# Patient Record
Sex: Female | Born: 1980 | Race: Black or African American | Hispanic: No | Marital: Single | Smoking: Current some day smoker
Health system: Southern US, Community
[De-identification: ages and names within clinical notes are randomized; demographics above are authoritative.]

## PROBLEM LIST (undated history)

## (undated) ENCOUNTER — Inpatient Hospital Stay (HOSPITAL_COMMUNITY): Payer: Self-pay

## (undated) DIAGNOSIS — K219 Gastro-esophageal reflux disease without esophagitis: Secondary | ICD-10-CM

## (undated) DIAGNOSIS — R42 Dizziness and giddiness: Secondary | ICD-10-CM

## (undated) DIAGNOSIS — F329 Major depressive disorder, single episode, unspecified: Secondary | ICD-10-CM

## (undated) DIAGNOSIS — F32A Depression, unspecified: Secondary | ICD-10-CM

## (undated) DIAGNOSIS — N83209 Unspecified ovarian cyst, unspecified side: Secondary | ICD-10-CM

## (undated) DIAGNOSIS — E669 Obesity, unspecified: Secondary | ICD-10-CM

---
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
## 2000-07-29 ENCOUNTER — Emergency Department (HOSPITAL_COMMUNITY): Admission: EM | Admit: 2000-07-29 | Discharge: 2000-07-30 | Payer: Self-pay | Admitting: Emergency Medicine

## 2000-07-29 ENCOUNTER — Encounter: Payer: Self-pay | Admitting: Emergency Medicine

## 2002-07-14 ENCOUNTER — Emergency Department (HOSPITAL_COMMUNITY): Admission: EM | Admit: 2002-07-14 | Discharge: 2002-07-14 | Payer: Self-pay | Admitting: Emergency Medicine

## 2002-08-11 ENCOUNTER — Emergency Department (HOSPITAL_COMMUNITY): Admission: EM | Admit: 2002-08-11 | Discharge: 2002-08-11 | Payer: Self-pay | Admitting: Emergency Medicine

## 2003-06-28 ENCOUNTER — Emergency Department (HOSPITAL_COMMUNITY): Admission: EM | Admit: 2003-06-28 | Discharge: 2003-06-28 | Payer: Self-pay

## 2003-10-04 ENCOUNTER — Emergency Department (HOSPITAL_COMMUNITY): Admission: EM | Admit: 2003-10-04 | Discharge: 2003-10-05 | Payer: Self-pay | Admitting: Emergency Medicine

## 2004-02-21 ENCOUNTER — Emergency Department (HOSPITAL_COMMUNITY): Admission: EM | Admit: 2004-02-21 | Discharge: 2004-02-21 | Payer: Self-pay | Admitting: Emergency Medicine

## 2004-05-23 ENCOUNTER — Emergency Department (HOSPITAL_COMMUNITY): Admission: EM | Admit: 2004-05-23 | Discharge: 2004-05-23 | Payer: Self-pay | Admitting: *Deleted

## 2004-12-11 ENCOUNTER — Emergency Department (HOSPITAL_COMMUNITY): Admission: EM | Admit: 2004-12-11 | Discharge: 2004-12-11 | Payer: Self-pay | Admitting: Family Medicine

## 2005-03-06 ENCOUNTER — Emergency Department (HOSPITAL_COMMUNITY): Admission: EM | Admit: 2005-03-06 | Discharge: 2005-03-06 | Payer: Self-pay | Admitting: *Deleted

## 2005-06-08 ENCOUNTER — Emergency Department (HOSPITAL_COMMUNITY): Admission: EM | Admit: 2005-06-08 | Discharge: 2005-06-08 | Payer: Self-pay | Admitting: Emergency Medicine

## 2006-06-22 ENCOUNTER — Emergency Department (HOSPITAL_COMMUNITY): Admission: EM | Admit: 2006-06-22 | Discharge: 2006-06-22 | Payer: Self-pay | Admitting: Family Medicine

## 2006-06-25 ENCOUNTER — Emergency Department (HOSPITAL_COMMUNITY): Admission: EM | Admit: 2006-06-25 | Discharge: 2006-06-25 | Payer: Self-pay | Admitting: Emergency Medicine

## 2006-07-07 ENCOUNTER — Emergency Department (HOSPITAL_COMMUNITY): Admission: EM | Admit: 2006-07-07 | Discharge: 2006-07-07 | Payer: Self-pay | Admitting: Emergency Medicine

## 2006-08-02 ENCOUNTER — Emergency Department (HOSPITAL_COMMUNITY): Admission: EM | Admit: 2006-08-02 | Discharge: 2006-08-02 | Payer: Self-pay | Admitting: *Deleted

## 2006-08-05 ENCOUNTER — Ambulatory Visit: Payer: Self-pay | Admitting: Family Medicine

## 2006-09-17 ENCOUNTER — Emergency Department (HOSPITAL_COMMUNITY): Admission: EM | Admit: 2006-09-17 | Discharge: 2006-09-17 | Payer: Self-pay | Admitting: Emergency Medicine

## 2006-09-24 ENCOUNTER — Emergency Department (HOSPITAL_COMMUNITY): Admission: EM | Admit: 2006-09-24 | Discharge: 2006-09-24 | Payer: Self-pay | Admitting: Family Medicine

## 2007-06-04 ENCOUNTER — Emergency Department (HOSPITAL_COMMUNITY): Admission: EM | Admit: 2007-06-04 | Discharge: 2007-06-04 | Payer: Self-pay | Admitting: Emergency Medicine

## 2007-07-09 ENCOUNTER — Inpatient Hospital Stay (HOSPITAL_COMMUNITY): Admission: AD | Admit: 2007-07-09 | Discharge: 2007-07-09 | Payer: Self-pay | Admitting: Gynecology

## 2007-08-18 ENCOUNTER — Ambulatory Visit (HOSPITAL_COMMUNITY): Admission: RE | Admit: 2007-08-18 | Discharge: 2007-08-18 | Payer: Self-pay | Admitting: Family Medicine

## 2007-08-26 ENCOUNTER — Ambulatory Visit (HOSPITAL_COMMUNITY): Admission: RE | Admit: 2007-08-26 | Discharge: 2007-08-26 | Payer: Self-pay | Admitting: Family Medicine

## 2007-09-21 ENCOUNTER — Ambulatory Visit (HOSPITAL_COMMUNITY): Admission: RE | Admit: 2007-09-21 | Discharge: 2007-09-21 | Payer: Self-pay | Admitting: Obstetrics & Gynecology

## 2007-09-28 ENCOUNTER — Inpatient Hospital Stay (HOSPITAL_COMMUNITY): Admission: AD | Admit: 2007-09-28 | Discharge: 2007-09-28 | Payer: Self-pay | Admitting: Obstetrics & Gynecology

## 2007-10-08 ENCOUNTER — Ambulatory Visit (HOSPITAL_COMMUNITY): Admission: RE | Admit: 2007-10-08 | Discharge: 2007-10-08 | Payer: Self-pay | Admitting: Obstetrics & Gynecology

## 2007-10-09 ENCOUNTER — Emergency Department (HOSPITAL_COMMUNITY): Admission: EM | Admit: 2007-10-09 | Discharge: 2007-10-10 | Payer: Self-pay | Admitting: *Deleted

## 2007-10-17 ENCOUNTER — Inpatient Hospital Stay (HOSPITAL_COMMUNITY): Admission: AD | Admit: 2007-10-17 | Discharge: 2007-10-17 | Payer: Self-pay | Admitting: Obstetrics and Gynecology

## 2007-11-06 ENCOUNTER — Ambulatory Visit (HOSPITAL_COMMUNITY): Admission: RE | Admit: 2007-11-06 | Discharge: 2007-11-06 | Payer: Self-pay | Admitting: Family Medicine

## 2007-12-01 ENCOUNTER — Ambulatory Visit: Payer: Self-pay | Admitting: *Deleted

## 2007-12-01 ENCOUNTER — Inpatient Hospital Stay (HOSPITAL_COMMUNITY): Admission: AD | Admit: 2007-12-01 | Discharge: 2007-12-01 | Payer: Self-pay | Admitting: Gynecology

## 2007-12-06 ENCOUNTER — Inpatient Hospital Stay (HOSPITAL_COMMUNITY): Admission: AD | Admit: 2007-12-06 | Discharge: 2007-12-06 | Payer: Self-pay | Admitting: Family Medicine

## 2007-12-06 ENCOUNTER — Ambulatory Visit: Payer: Self-pay | Admitting: Obstetrics and Gynecology

## 2008-01-03 ENCOUNTER — Ambulatory Visit: Payer: Self-pay | Admitting: Obstetrics and Gynecology

## 2008-01-03 ENCOUNTER — Inpatient Hospital Stay (HOSPITAL_COMMUNITY): Admission: AD | Admit: 2008-01-03 | Discharge: 2008-01-03 | Payer: Self-pay | Admitting: Obstetrics and Gynecology

## 2008-01-14 ENCOUNTER — Inpatient Hospital Stay (HOSPITAL_COMMUNITY): Admission: AD | Admit: 2008-01-14 | Discharge: 2008-01-14 | Payer: Self-pay | Admitting: Obstetrics & Gynecology

## 2008-01-14 ENCOUNTER — Ambulatory Visit: Payer: Self-pay | Admitting: Advanced Practice Midwife

## 2008-02-04 ENCOUNTER — Inpatient Hospital Stay (HOSPITAL_COMMUNITY): Admission: AD | Admit: 2008-02-04 | Discharge: 2008-02-04 | Payer: Self-pay | Admitting: Obstetrics and Gynecology

## 2008-02-04 ENCOUNTER — Ambulatory Visit: Payer: Self-pay | Admitting: *Deleted

## 2008-02-05 ENCOUNTER — Inpatient Hospital Stay (HOSPITAL_COMMUNITY): Admission: AD | Admit: 2008-02-05 | Discharge: 2008-02-05 | Payer: Self-pay | Admitting: Obstetrics & Gynecology

## 2008-02-05 ENCOUNTER — Ambulatory Visit: Payer: Self-pay | Admitting: Obstetrics and Gynecology

## 2008-02-22 ENCOUNTER — Inpatient Hospital Stay (HOSPITAL_COMMUNITY): Admission: AD | Admit: 2008-02-22 | Discharge: 2008-02-22 | Payer: Self-pay | Admitting: Obstetrics & Gynecology

## 2008-02-22 ENCOUNTER — Ambulatory Visit: Payer: Self-pay | Admitting: *Deleted

## 2008-02-26 ENCOUNTER — Ambulatory Visit (HOSPITAL_COMMUNITY): Admission: RE | Admit: 2008-02-26 | Discharge: 2008-02-26 | Payer: Self-pay | Admitting: Family Medicine

## 2008-03-02 ENCOUNTER — Ambulatory Visit: Payer: Self-pay | Admitting: Obstetrics & Gynecology

## 2008-03-02 ENCOUNTER — Inpatient Hospital Stay (HOSPITAL_COMMUNITY): Admission: AD | Admit: 2008-03-02 | Discharge: 2008-03-05 | Payer: Self-pay | Admitting: Obstetrics & Gynecology

## 2008-03-31 ENCOUNTER — Ambulatory Visit: Payer: Self-pay | Admitting: Internal Medicine

## 2008-06-28 ENCOUNTER — Emergency Department (HOSPITAL_COMMUNITY): Admission: EM | Admit: 2008-06-28 | Discharge: 2008-06-28 | Payer: Self-pay | Admitting: Emergency Medicine

## 2008-12-30 ENCOUNTER — Emergency Department (HOSPITAL_COMMUNITY): Admission: EM | Admit: 2008-12-30 | Discharge: 2008-12-30 | Payer: Self-pay | Admitting: Emergency Medicine

## 2009-05-21 ENCOUNTER — Emergency Department (HOSPITAL_COMMUNITY): Admission: EM | Admit: 2009-05-21 | Discharge: 2009-05-21 | Payer: Self-pay | Admitting: Emergency Medicine

## 2009-06-18 ENCOUNTER — Emergency Department (HOSPITAL_COMMUNITY): Admission: EM | Admit: 2009-06-18 | Discharge: 2009-06-18 | Payer: Self-pay | Admitting: Emergency Medicine

## 2009-12-14 ENCOUNTER — Emergency Department (HOSPITAL_COMMUNITY): Admission: EM | Admit: 2009-12-14 | Discharge: 2009-12-14 | Payer: Self-pay | Admitting: Emergency Medicine

## 2010-01-06 ENCOUNTER — Emergency Department (HOSPITAL_COMMUNITY): Admission: EM | Admit: 2010-01-06 | Discharge: 2010-01-06 | Payer: Self-pay | Admitting: Emergency Medicine

## 2010-02-28 ENCOUNTER — Observation Stay (HOSPITAL_COMMUNITY): Admission: EM | Admit: 2010-02-28 | Discharge: 2010-02-28 | Payer: Self-pay | Admitting: Emergency Medicine

## 2010-03-01 ENCOUNTER — Emergency Department (HOSPITAL_COMMUNITY): Admission: EM | Admit: 2010-03-01 | Discharge: 2010-03-01 | Payer: Self-pay | Admitting: Emergency Medicine

## 2010-05-28 ENCOUNTER — Ambulatory Visit: Payer: Self-pay | Admitting: Nurse Practitioner

## 2010-05-28 ENCOUNTER — Inpatient Hospital Stay (HOSPITAL_COMMUNITY): Admission: AD | Admit: 2010-05-28 | Discharge: 2010-05-28 | Payer: Self-pay | Admitting: Obstetrics and Gynecology

## 2010-07-21 ENCOUNTER — Emergency Department (HOSPITAL_COMMUNITY): Admission: EM | Admit: 2010-07-21 | Discharge: 2010-07-22 | Payer: Self-pay | Admitting: Emergency Medicine

## 2010-09-04 ENCOUNTER — Emergency Department (HOSPITAL_COMMUNITY): Admission: EM | Admit: 2010-09-04 | Discharge: 2010-09-04 | Payer: Self-pay | Admitting: Emergency Medicine

## 2010-09-06 ENCOUNTER — Emergency Department (HOSPITAL_COMMUNITY): Admission: EM | Admit: 2010-09-06 | Discharge: 2010-09-06 | Payer: Self-pay | Admitting: Family Medicine

## 2010-09-10 ENCOUNTER — Emergency Department (HOSPITAL_COMMUNITY): Admission: EM | Admit: 2010-09-10 | Discharge: 2010-09-11 | Payer: Self-pay | Admitting: Emergency Medicine

## 2010-12-21 ENCOUNTER — Emergency Department (HOSPITAL_COMMUNITY)
Admission: EM | Admit: 2010-12-21 | Discharge: 2010-12-22 | Disposition: A | Discharge status: Home / Self Care | Payer: Medicare Other | Attending: Emergency Medicine | Admitting: Emergency Medicine

## 2010-12-21 DIAGNOSIS — K089 Disorder of teeth and supporting structures, unspecified: Secondary | ICD-10-CM | POA: Insufficient documentation

## 2010-12-21 DIAGNOSIS — K029 Dental caries, unspecified: Secondary | ICD-10-CM | POA: Insufficient documentation

## 2010-12-21 DIAGNOSIS — F3130 Bipolar disorder, current episode depressed, mild or moderate severity, unspecified: Secondary | ICD-10-CM | POA: Insufficient documentation

## 2011-01-02 LAB — POCT URINALYSIS DIPSTICK
Glucose, UA: NEGATIVE mg/dL
Hgb urine dipstick: NEGATIVE
Nitrite: NEGATIVE
Specific Gravity, Urine: >=1.03 (ref 1.005–1.030)
Urobilinogen, UA: 1 mg/dL (ref 0.0–1.0)

## 2011-01-02 LAB — RAPID STREP SCREEN (MED CTR MEBANE ONLY): Streptococcus, Group A Screen (Direct): NEGATIVE

## 2011-01-04 LAB — CBC
Platelets: 207 10*3/uL (ref 150–400)
RBC: 4.57 MIL/uL (ref 3.87–5.11)
WBC: 6.6 10*3/uL (ref 4.0–10.5)

## 2011-01-04 LAB — WET PREP, GENITAL: Trich, Wet Prep: NONE SEEN

## 2011-01-04 LAB — URINALYSIS, ROUTINE W REFLEX MICROSCOPIC
Bilirubin Urine: NEGATIVE
Glucose, UA: NEGATIVE mg/dL
Ketones, ur: NEGATIVE mg/dL
Leukocytes, UA: NEGATIVE
Nitrite: NEGATIVE
Protein, ur: NEGATIVE mg/dL
Specific Gravity, Urine: 1.015 (ref 1.005–1.030)
Urobilinogen, UA: 0.2 mg/dL (ref 0.0–1.0)
pH: 6 (ref 5.0–8.0)

## 2011-01-04 LAB — URINE MICROSCOPIC-ADD ON

## 2011-01-04 LAB — POCT PREGNANCY, URINE: Preg Test, Ur: NEGATIVE

## 2011-02-20 ENCOUNTER — Emergency Department (HOSPITAL_COMMUNITY)
Admission: EM | Admit: 2011-02-20 | Discharge: 2011-02-20 | Disposition: A | Discharge status: Home / Self Care | Payer: Medicare Other | Attending: Emergency Medicine | Admitting: Emergency Medicine

## 2011-02-20 DIAGNOSIS — J45909 Unspecified asthma, uncomplicated: Secondary | ICD-10-CM | POA: Insufficient documentation

## 2011-02-20 DIAGNOSIS — F3130 Bipolar disorder, current episode depressed, mild or moderate severity, unspecified: Secondary | ICD-10-CM | POA: Insufficient documentation

## 2011-02-20 DIAGNOSIS — M7989 Other specified soft tissue disorders: Secondary | ICD-10-CM | POA: Insufficient documentation

## 2011-02-20 DIAGNOSIS — L03019 Cellulitis of unspecified finger: Secondary | ICD-10-CM | POA: Insufficient documentation

## 2011-02-20 DIAGNOSIS — M79609 Pain in unspecified limb: Secondary | ICD-10-CM | POA: Insufficient documentation

## 2011-06-21 ENCOUNTER — Emergency Department (HOSPITAL_COMMUNITY)
Admission: EM | Admit: 2011-06-21 | Discharge: 2011-06-21 | Disposition: A | Discharge status: Home / Self Care | Payer: Medicare Other | Attending: Emergency Medicine | Admitting: Emergency Medicine

## 2011-06-21 DIAGNOSIS — F3130 Bipolar disorder, current episode depressed, mild or moderate severity, unspecified: Secondary | ICD-10-CM | POA: Insufficient documentation

## 2011-06-21 DIAGNOSIS — H5710 Ocular pain, unspecified eye: Secondary | ICD-10-CM | POA: Insufficient documentation

## 2011-06-21 DIAGNOSIS — J45909 Unspecified asthma, uncomplicated: Secondary | ICD-10-CM | POA: Insufficient documentation

## 2011-06-21 DIAGNOSIS — H9209 Otalgia, unspecified ear: Secondary | ICD-10-CM | POA: Insufficient documentation

## 2011-06-21 DIAGNOSIS — H11419 Vascular abnormalities of conjunctiva, unspecified eye: Secondary | ICD-10-CM | POA: Insufficient documentation

## 2011-06-21 DIAGNOSIS — H53149 Visual discomfort, unspecified: Secondary | ICD-10-CM | POA: Insufficient documentation

## 2011-07-12 LAB — URINALYSIS, ROUTINE W REFLEX MICROSCOPIC
Nitrite: NEGATIVE
Specific Gravity, Urine: 1.025
pH: 7

## 2011-07-15 LAB — URINALYSIS, ROUTINE W REFLEX MICROSCOPIC
Bilirubin Urine: NEGATIVE
Nitrite: NEGATIVE
Protein, ur: NEGATIVE
Specific Gravity, Urine: 1.015
Urobilinogen, UA: 0.2

## 2011-07-16 LAB — URINALYSIS, ROUTINE W REFLEX MICROSCOPIC
Hgb urine dipstick: NEGATIVE
Ketones, ur: NEGATIVE
Protein, ur: NEGATIVE
Urobilinogen, UA: 0.2

## 2011-07-17 LAB — CBC
MCV: 82.8
Platelets: 197
RDW: 13.7
WBC: 13.3 — ABNORMAL HIGH

## 2011-07-24 ENCOUNTER — Emergency Department (HOSPITAL_COMMUNITY)
Admission: EM | Admit: 2011-07-24 | Discharge: 2011-07-24 | Disposition: A | Discharge status: Home / Self Care | Payer: Medicare Other | Attending: Emergency Medicine | Admitting: Emergency Medicine

## 2011-07-24 ENCOUNTER — Emergency Department (HOSPITAL_COMMUNITY): Payer: Medicare Other

## 2011-07-24 DIAGNOSIS — Z8659 Personal history of other mental and behavioral disorders: Secondary | ICD-10-CM | POA: Insufficient documentation

## 2011-07-24 DIAGNOSIS — R05 Cough: Secondary | ICD-10-CM | POA: Insufficient documentation

## 2011-07-24 DIAGNOSIS — J3489 Other specified disorders of nose and nasal sinuses: Secondary | ICD-10-CM | POA: Insufficient documentation

## 2011-07-24 DIAGNOSIS — W010XXA Fall on same level from slipping, tripping and stumbling without subsequent striking against object, initial encounter: Secondary | ICD-10-CM | POA: Insufficient documentation

## 2011-07-24 DIAGNOSIS — F3130 Bipolar disorder, current episode depressed, mild or moderate severity, unspecified: Secondary | ICD-10-CM | POA: Insufficient documentation

## 2011-07-24 DIAGNOSIS — R0602 Shortness of breath: Secondary | ICD-10-CM | POA: Insufficient documentation

## 2011-07-24 DIAGNOSIS — M25569 Pain in unspecified knee: Secondary | ICD-10-CM | POA: Insufficient documentation

## 2011-07-24 DIAGNOSIS — J45901 Unspecified asthma with (acute) exacerbation: Secondary | ICD-10-CM | POA: Insufficient documentation

## 2011-07-24 DIAGNOSIS — R0989 Other specified symptoms and signs involving the circulatory and respiratory systems: Secondary | ICD-10-CM | POA: Insufficient documentation

## 2011-07-24 DIAGNOSIS — S8000XA Contusion of unspecified knee, initial encounter: Secondary | ICD-10-CM | POA: Insufficient documentation

## 2011-07-24 DIAGNOSIS — R059 Cough, unspecified: Secondary | ICD-10-CM | POA: Insufficient documentation

## 2011-07-24 DIAGNOSIS — R0609 Other forms of dyspnea: Secondary | ICD-10-CM | POA: Insufficient documentation

## 2011-07-26 LAB — URINALYSIS, ROUTINE W REFLEX MICROSCOPIC
Bilirubin Urine: NEGATIVE
Bilirubin Urine: NEGATIVE
Glucose, UA: NEGATIVE
Hgb urine dipstick: NEGATIVE
Ketones, ur: NEGATIVE
Nitrite: NEGATIVE
Nitrite: NEGATIVE
Nitrite: NEGATIVE
Protein, ur: NEGATIVE
Specific Gravity, Urine: 1.025
Specific Gravity, Urine: 1.014
Urobilinogen, UA: 0.2
Urobilinogen, UA: 0.2
pH: 6
pH: 6.5
pH: 7

## 2011-07-26 LAB — GC/CHLAMYDIA PROBE AMP, GENITAL: Chlamydia, DNA Probe: NEGATIVE

## 2011-07-26 LAB — DIFFERENTIAL
Basophils Relative: 0
Lymphs Abs: 1.7
Monocytes Absolute: 0.4
Monocytes Relative: 6
Neutro Abs: 4
Neutrophils Relative %: 63

## 2011-07-26 LAB — CBC
HCT: 31.7 — ABNORMAL LOW
Hemoglobin: 11.1 — ABNORMAL LOW
Hemoglobin: 11 — ABNORMAL LOW
MCHC: 35.1
MCV: 83.5
RBC: 3.75 — ABNORMAL LOW
WBC: 5.6
WBC: 6.3

## 2011-07-26 LAB — I-STAT 8, (EC8 V) (CONVERTED LAB)
BUN: 3 — ABNORMAL LOW
Glucose, Bld: 84
Hemoglobin: 12.2
Potassium: 3.8
Sodium: 136
pH, Ven: 7.377 — ABNORMAL HIGH

## 2011-07-26 LAB — POCT I-STAT CREATININE
Creatinine, Ser: 0.7
Operator id: 295021

## 2011-07-26 LAB — WET PREP, GENITAL

## 2011-07-29 LAB — URINALYSIS, ROUTINE W REFLEX MICROSCOPIC
Ketones, ur: NEGATIVE
Nitrite: NEGATIVE
Protein, ur: NEGATIVE
Urobilinogen, UA: 0.2

## 2011-08-01 LAB — URINALYSIS, ROUTINE W REFLEX MICROSCOPIC
Bilirubin Urine: NEGATIVE
Ketones, ur: 15 — AB
Nitrite: NEGATIVE
Protein, ur: NEGATIVE
Urobilinogen, UA: 1
pH: 6

## 2011-08-01 LAB — WET PREP, GENITAL: WBC, Wet Prep HPF POC: NONE SEEN

## 2011-08-01 LAB — CBC
HCT: 37.8
Hemoglobin: 12.9
MCHC: 34.3
RBC: 4.52

## 2011-08-01 LAB — POCT PREGNANCY, URINE
Operator id: 26670
Preg Test, Ur: POSITIVE

## 2011-08-01 LAB — GC/CHLAMYDIA PROBE AMP, GENITAL: Chlamydia, DNA Probe: NEGATIVE

## 2011-08-01 LAB — URINE MICROSCOPIC-ADD ON

## 2011-10-22 ENCOUNTER — Emergency Department (HOSPITAL_COMMUNITY)
Admission: EM | Admit: 2011-10-22 | Discharge: 2011-10-22 | Disposition: A | Discharge status: Home / Self Care | Payer: Medicare Other | Attending: Emergency Medicine | Admitting: Emergency Medicine

## 2011-10-22 ENCOUNTER — Encounter: Payer: Self-pay | Admitting: Emergency Medicine

## 2011-10-22 ENCOUNTER — Emergency Department (HOSPITAL_COMMUNITY): Payer: Medicare Other

## 2011-10-22 DIAGNOSIS — Z9114 Patient's other noncompliance with medication regimen: Secondary | ICD-10-CM

## 2011-10-22 DIAGNOSIS — Z9119 Patient's noncompliance with other medical treatment and regimen: Secondary | ICD-10-CM | POA: Insufficient documentation

## 2011-10-22 DIAGNOSIS — J45909 Unspecified asthma, uncomplicated: Secondary | ICD-10-CM | POA: Diagnosis not present

## 2011-10-22 DIAGNOSIS — J45901 Unspecified asthma with (acute) exacerbation: Secondary | ICD-10-CM | POA: Diagnosis not present

## 2011-10-22 DIAGNOSIS — R05 Cough: Secondary | ICD-10-CM | POA: Insufficient documentation

## 2011-10-22 DIAGNOSIS — R0602 Shortness of breath: Secondary | ICD-10-CM | POA: Diagnosis not present

## 2011-10-22 DIAGNOSIS — R059 Cough, unspecified: Secondary | ICD-10-CM | POA: Diagnosis not present

## 2011-10-22 DIAGNOSIS — Z91199 Patient's noncompliance with other medical treatment and regimen due to unspecified reason: Secondary | ICD-10-CM | POA: Insufficient documentation

## 2011-10-22 MED ORDER — PREDNISONE 20 MG PO TABS
60.0000 mg | ORAL_TABLET | Freq: Once | ORAL | Status: AC
  Administered 2011-10-22: 60 mg via ORAL
  Filled 2011-10-22: qty 3

## 2011-10-22 MED ORDER — ALBUTEROL SULFATE (5 MG/ML) 0.5% IN NEBU
5.0000 mg | INHALATION_SOLUTION | Freq: Once | RESPIRATORY_TRACT | Status: DC
  Filled 2011-10-22: qty 1

## 2011-10-22 MED ORDER — PREDNISONE 10 MG PO TABS: 20.0000 mg | ORAL_TABLET | Freq: Every day | ORAL | Status: DC

## 2011-10-22 MED ORDER — ALBUTEROL SULFATE (5 MG/ML) 0.5% IN NEBU
5.0000 mg | INHALATION_SOLUTION | Freq: Once | RESPIRATORY_TRACT | Status: AC
  Administered 2011-10-22: 5 mg via RESPIRATORY_TRACT
  Filled 2011-10-22: qty 0.5

## 2011-10-22 MED ORDER — IPRATROPIUM BROMIDE 0.02 % IN SOLN
0.5000 mg | Freq: Once | RESPIRATORY_TRACT | Status: AC
  Administered 2011-10-22: 0.5 mg via RESPIRATORY_TRACT

## 2011-10-22 MED ORDER — AEROCHAMBER PLUS W/MASK MISC
Status: AC
  Administered 2011-10-22: 13:00:00
  Filled 2011-10-22: qty 1

## 2011-10-22 MED ORDER — ALBUTEROL SULFATE HFA 108 (90 BASE) MCG/ACT IN AERS
2.0000 | INHALATION_SPRAY | RESPIRATORY_TRACT | Status: DC | PRN
  Administered 2011-10-22: 2 via RESPIRATORY_TRACT
  Filled 2011-10-22: qty 6.7

## 2011-10-22 NOTE — ED Notes (Signed)
Pt reports less wheezing and decreased WOB with nebulizer treatment and steroid administration

## 2011-10-22 NOTE — ED Provider Notes (Signed)
History     CSN: [id number]  Arrival date & time 10/22/11  1013   First MD Initiated Contact with Patient 10/22/11 1103      Chief Complaint  Patient presents with  . Asthma    (Consider location/radiation/quality/duration/timing/severity/associated sxs/prior treatment) HPI Plan and wheezing which began today. She states she's been out of her medications. She states she has some productive cough. She has not had a fever. Past Medical History  Diagnosis Date  . Asthma     History reviewed. No pertinent past surgical history.  History reviewed. No pertinent family history.  History  Substance Use Topics  . Smoking status: Current Everyday Smoker  . Smokeless tobacco: Not on file  . Alcohol Use: Yes    OB History    Grav Para Term Preterm Abortions TAB SAB Ect Mult Living                  Review of Systems  All other systems reviewed and are negative.    Allergies  Amoxicillin  Home Medications   Current Outpatient Rx  Name Route Sig Dispense Refill  . ALBUTEROL SULFATE HFA 108 (90 BASE) MCG/ACT IN AERS Inhalation Inhale 2 puffs into the lungs every 6 (six) hours as needed. For shortness of breath       BP 110/66  Pulse 85  Temp(Src) 98.2 F (36.8 C) (Oral)  Resp 20  SpO2 100%  Physical Exam  Nursing note and vitals reviewed. Constitutional: She is oriented to person, place, and time. She appears well-developed and well-nourished.  HENT:  Head: Atraumatic.  Right Ear: External ear normal.  Left Ear: External ear normal.  Mouth/Throat: Oropharynx is clear and moist.  Eyes: Conjunctivae and EOM are normal. Pupils are equal, round, and reactive to light.  Neck: Normal range of motion.  Cardiovascular: Normal rate and regular rhythm.   Pulmonary/Chest: Effort normal.       Lungs radiating from throughout with expiratory wheezes in all fields of her throat.  Abdominal: Soft. Bowel sounds are normal.  Musculoskeletal: Normal range of motion.    Neurological: She is oriented to person, place, and time. She has normal reflexes.  Skin: Skin is warm and dry.  Psychiatric: She has a normal mood and affect.    ED Course  Procedures (including critical care time)  Labs Reviewed - No data to display No results found.   No diagnosis found.    MDM  Had one nebulizer treatment and prednisone and states that she feels completely better. Her lungs are clear there is no more        Hilario Quarry, MD 10/22/11 [phone]

## 2011-10-22 NOTE — ED Notes (Signed)
Pt c/o increased SOB and wheezing from asthma; pt sts out of her meds x 1 week; pt sts productive cough with green sputum but denies fever

## 2011-11-07 ENCOUNTER — Emergency Department (HOSPITAL_COMMUNITY)
Admission: EM | Admit: 2011-11-07 | Discharge: 2011-11-08 | Disposition: A | Discharge status: Home / Self Care | Payer: Medicare Other | Attending: Emergency Medicine | Admitting: Emergency Medicine

## 2011-11-07 ENCOUNTER — Encounter (HOSPITAL_COMMUNITY): Payer: Self-pay | Admitting: *Deleted

## 2011-11-07 DIAGNOSIS — S39012A Strain of muscle, fascia and tendon of lower back, initial encounter: Secondary | ICD-10-CM

## 2011-11-07 DIAGNOSIS — S335XXA Sprain of ligaments of lumbar spine, initial encounter: Secondary | ICD-10-CM | POA: Insufficient documentation

## 2011-11-07 DIAGNOSIS — R51 Headache: Secondary | ICD-10-CM | POA: Diagnosis not present

## 2011-11-07 DIAGNOSIS — R404 Transient alteration of awareness: Secondary | ICD-10-CM | POA: Insufficient documentation

## 2011-11-07 DIAGNOSIS — E669 Obesity, unspecified: Secondary | ICD-10-CM | POA: Diagnosis not present

## 2011-11-07 DIAGNOSIS — T148XXA Other injury of unspecified body region, initial encounter: Secondary | ICD-10-CM | POA: Diagnosis not present

## 2011-11-07 DIAGNOSIS — J45909 Unspecified asthma, uncomplicated: Secondary | ICD-10-CM | POA: Diagnosis not present

## 2011-11-07 DIAGNOSIS — S0990XA Unspecified injury of head, initial encounter: Secondary | ICD-10-CM | POA: Diagnosis not present

## 2011-11-07 MED ORDER — OXYCODONE-ACETAMINOPHEN 5-325 MG PO TABS
1.0000 | ORAL_TABLET | Freq: Once | ORAL | Status: AC
Start: 2011-11-07 — End: 2011-11-07
  Administered 2011-11-07: 1 via ORAL
  Filled 2011-11-07: qty 1
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
## 2011-11-08 MED ORDER — OXYCODONE-ACETAMINOPHEN 5-325 MG PO TABS: 1.0000 | ORAL_TABLET | Freq: Three times a day (TID) | ORAL | Status: AC | PRN

## 2011-11-08 MED ORDER — IBUPROFEN 800 MG PO TABS: 800.0000 mg | ORAL_TABLET | Freq: Three times a day (TID) | ORAL | Status: AC

## 2011-11-08 NOTE — ED Notes (Signed)
Pt stated understanding of discharge instructions.

## 2011-12-03 DIAGNOSIS — R69 Illness, unspecified: Secondary | ICD-10-CM | POA: Diagnosis not present

## 2011-12-03 DIAGNOSIS — Z130 Encounter for screening for diseases of the blood and blood-forming organs and certain disorders involving the immune mechanism: Secondary | ICD-10-CM | POA: Diagnosis not present

## 2011-12-03 DIAGNOSIS — F319 Bipolar disorder, unspecified: Secondary | ICD-10-CM | POA: Diagnosis not present

## 2011-12-03 DIAGNOSIS — M62838 Other muscle spasm: Secondary | ICD-10-CM | POA: Diagnosis not present

## 2011-12-31 DIAGNOSIS — J45901 Unspecified asthma with (acute) exacerbation: Secondary | ICD-10-CM | POA: Diagnosis not present

## 2012-01-29 DIAGNOSIS — J45909 Unspecified asthma, uncomplicated: Secondary | ICD-10-CM | POA: Diagnosis not present

## 2012-01-29 DIAGNOSIS — J019 Acute sinusitis, unspecified: Secondary | ICD-10-CM | POA: Diagnosis not present

## 2012-02-03 ENCOUNTER — Emergency Department (HOSPITAL_COMMUNITY)
Admission: EM | Admit: 2012-02-03 | Discharge: 2012-02-03 | Disposition: A | Discharge status: Home / Self Care | Payer: Medicare Other | Attending: Emergency Medicine | Admitting: Emergency Medicine

## 2012-02-03 ENCOUNTER — Encounter (HOSPITAL_COMMUNITY): Payer: Self-pay

## 2012-02-03 DIAGNOSIS — H65199 Other acute nonsuppurative otitis media, unspecified ear: Secondary | ICD-10-CM | POA: Diagnosis not present

## 2012-02-03 DIAGNOSIS — H6690 Otitis media, unspecified, unspecified ear: Secondary | ICD-10-CM | POA: Insufficient documentation

## 2012-02-03 DIAGNOSIS — H6692 Otitis media, unspecified, left ear: Secondary | ICD-10-CM

## 2012-02-03 DIAGNOSIS — J3489 Other specified disorders of nose and nasal sinuses: Secondary | ICD-10-CM | POA: Diagnosis not present

## 2012-02-03 DIAGNOSIS — H9209 Otalgia, unspecified ear: Secondary | ICD-10-CM | POA: Diagnosis not present

## 2012-02-03 MED ORDER — AZITHROMYCIN 250 MG PO TABS: ORAL_TABLET | ORAL | Status: AC

## 2012-02-03 MED ORDER — HYDROCODONE-ACETAMINOPHEN 5-325 MG PO TABS: 1.0000 | ORAL_TABLET | ORAL | Status: AC | PRN

## 2012-02-03 MED ORDER — ANTIPYRINE-BENZOCAINE 5.4-1.4 % OT SOLN
3.0000 [drp] | OTIC | Status: DC | PRN
  Administered 2012-02-03: 4 [drp] via OTIC
  Filled 2012-02-03: qty 10

## 2012-02-03 NOTE — ED Notes (Signed)
C/o bilateral ear pain with left hurting more than right. Pt was dx with sinus infection last Wednesday and has been taking levaquin. States feeling better in regards to sinus pain but bilateral ear pain developed over the weekend. Pt denies other sx, in no acute distress.

## 2012-02-03 NOTE — ED Provider Notes (Signed)
History     CSN: [id number]  Arrival date & time 02/03/12  2956   First MD Initiated Contact with Patient 02/03/12 1046      Chief Complaint  Patient presents with  . Otalgia  . Nasal Congestion    (Consider location/radiation/quality/duration/timing/severity/associated sxs/prior treatment) The history is provided by the patient.   31 year old female with past medical history of asthma presents emergency department with one week of sinus congestion and and left ear pain. She was seen by another provider 5 days ago was diagnosed with sinusitis; she was placed on Levaquin and has been taking this as directed with good improvement in her nasal congestion. Today with continued left ear pain that she feels is worsening and is associated with slightly decreased hearing. Denies fever, chills, drainage from the ear, eye pain, visual disturbance, sore throat, neck pain. Denies CP or SOB. Nothing makes symptoms better or worse. Has been taking ibuprofen without relief.  Past Medical History  Diagnosis Date  . Asthma     Past Surgical History  Procedure Date  . Cystectomy     No family history on file.  History  Substance Use Topics  . Smoking status: Current Everyday Smoker  . Smokeless tobacco: Not on file  . Alcohol Use: Yes     Review of Systems 10 systems reviewed and are negative for acute change except as noted in the HPI.  Allergies  Amoxicillin  Home Medications   Current Outpatient Rx  Name Route Sig Dispense Refill  . CETIRIZINE HCL 10 MG PO TABS Oral Take 10 mg by mouth daily.    Marland Kitchen FLUTICASONE-SALMETEROL 500-50 MCG/DOSE IN AEPB Inhalation Inhale 1 puff into the lungs every 12 (twelve) hours.    Marland Kitchen LEVOFLOXACIN 750 MG PO TABS Oral Take 750 mg by mouth daily. For 7 days; started on 01/29/12      BP 108/67  Pulse 81  Temp(Src) 98.2 F (36.8 C) (Oral)  Resp 20  SpO2 98%  LMP 01/23/2012  Physical Exam  Constitutional: She is oriented to person, place, and  time. She appears well-developed and well-nourished. Distressed: uncomfortable appearing.  HENT:  Head: Normocephalic and atraumatic.  Right Ear: Hearing, tympanic membrane, external ear and ear canal normal.  Left Ear: External ear and ear canal normal. No drainage. No mastoid tenderness. Tympanic membrane is erythematous. Tympanic membrane is not perforated. A middle ear effusion is present. No hemotympanum. Decreased hearing is noted.  Nose: Nose normal.  Mouth/Throat: Oropharynx is clear and moist. No oropharyngeal exudate.  Eyes: Conjunctivae are normal. Pupils are equal, round, and reactive to light. Right eye exhibits no discharge. Left eye exhibits no discharge.  Neck: Normal range of motion. Neck supple.  Cardiovascular: Normal rate, regular rhythm and normal heart sounds.   Pulmonary/Chest: Effort normal and breath sounds normal. No respiratory distress. She has no wheezes. She exhibits no tenderness.  Musculoskeletal: She exhibits no edema and no tenderness.  Lymphadenopathy:    She has no cervical adenopathy.  Neurological: She is alert and oriented to person, place, and time. No cranial nerve deficit.  Skin: Skin is warm and dry.  Psychiatric: She has a normal mood and affect.    ED Course  Procedures (including critical care time)  Labs Reviewed - No data to display No results found.   Dx 1: left otitis media   MDM  Left OM with pain x 1 week. Non-toxic appearing pt. Suppurative effusion seen with 1 area of bullous appearance. Will rx with abx.  Some improvement with auralgan, but still with sig pain, will give rx for pain medication as well.        Shaaron Adler, PA-C 02/03/12 1213

## 2012-02-03 NOTE — Discharge Instructions (Signed)
Otitis Media, Adult  A middle ear infection is an infection in the space behind the eardrum. The medical name for this is "otitis media." It may happen after a common cold. It is caused by a germ that starts growing in that space. You may feel swollen glands in your neck on the side of the ear infection.  HOME CARE INSTRUCTIONS   · Take your medicine as directed until it is gone, even if you feel better after the first few days.  · Only take over-the-counter or prescription medicines for pain, discomfort, or fever as directed by your caregiver.  · Occasional use of a nasal decongestant a couple times per day may help with discomfort and help the eustachian tube to drain better.  Follow up with your caregiver in 10 to 14 days or as directed, to be certain that the infection has cleared. Not keeping the appointment could result in a chronic or permanent injury, pain, hearing loss and disability. If there is any problem keeping the appointment, you must call back to this facility for assistance.  SEEK IMMEDIATE MEDICAL CARE IF:   · You are not getting better in 2 to 3 days.  · You have pain that is not controlled with medication.  · You feel worse instead of better.  · You cannot use the medication as directed.  · You develop swelling, redness or pain around the ear or stiffness in your neck.  MAKE SURE YOU:   · Understand these instructions.  · Will watch your condition.  · Will get help right away if you are not doing well or get worse.  Document Released: 07/12/2004 Document Revised: 09/26/2011 Document Reviewed: 05/13/2008  [employer]® Patient Information ©2012 [employer].

## 2012-02-03 NOTE — ED Notes (Signed)
Patient presents with left ear pain and sinus congestion x 1 week. Patient was seen and diagnosed with a sinus infection. Patient reporting her left ear is still hurting despite taking ibuprofen at home.

## 2012-02-03 NOTE — ED Provider Notes (Signed)
Medical screening examination/treatment/procedure(s) were performed by non-physician practitioner and as supervising physician I was immediately available for consultation/collaboration.   Pearlena Ow L Zackerie Sara, MD 02/03/12 1408 

## 2012-04-29 DIAGNOSIS — F17200 Nicotine dependence, unspecified, uncomplicated: Secondary | ICD-10-CM | POA: Diagnosis not present

## 2012-04-29 DIAGNOSIS — J45909 Unspecified asthma, uncomplicated: Secondary | ICD-10-CM | POA: Diagnosis not present

## 2012-05-20 DIAGNOSIS — F319 Bipolar disorder, unspecified: Secondary | ICD-10-CM | POA: Diagnosis not present

## 2012-05-20 DIAGNOSIS — J45909 Unspecified asthma, uncomplicated: Secondary | ICD-10-CM | POA: Diagnosis not present

## 2012-06-17 ENCOUNTER — Emergency Department (HOSPITAL_COMMUNITY)
Admission: EM | Admit: 2012-06-17 | Discharge: 2012-06-17 | Disposition: A | Discharge status: Home / Self Care | Payer: Medicare Other | Attending: Emergency Medicine | Admitting: Emergency Medicine

## 2012-06-17 ENCOUNTER — Encounter (HOSPITAL_COMMUNITY): Payer: Self-pay | Admitting: Emergency Medicine

## 2012-06-17 DIAGNOSIS — J45909 Unspecified asthma, uncomplicated: Secondary | ICD-10-CM

## 2012-06-17 DIAGNOSIS — Z760 Encounter for issue of repeat prescription: Secondary | ICD-10-CM | POA: Insufficient documentation

## 2012-06-17 DIAGNOSIS — F17200 Nicotine dependence, unspecified, uncomplicated: Secondary | ICD-10-CM | POA: Insufficient documentation

## 2012-06-17 MED ORDER — PREDNISONE 10 MG PO TABS: 20.0000 mg | ORAL_TABLET | Freq: Every day | ORAL | Status: DC

## 2012-06-17 MED ORDER — ALBUTEROL SULFATE HFA 108 (90 BASE) MCG/ACT IN AERS: 2.0000 | INHALATION_SPRAY | RESPIRATORY_TRACT | Status: DC | PRN

## 2012-06-17 MED ORDER — FLUTICASONE-SALMETEROL 500-50 MCG/DOSE IN AEPB: 1.0000 | INHALATION_SPRAY | Freq: Two times a day (BID) | RESPIRATORY_TRACT | Status: DC

## 2012-06-17 MED ORDER — PREDNISONE 20 MG PO TABS
60.0000 mg | ORAL_TABLET | Freq: Once | ORAL | Status: AC
  Administered 2012-06-17: 60 mg via ORAL
  Filled 2012-06-17: qty 3

## 2012-06-17 NOTE — ED Provider Notes (Signed)
History   This chart was scribed for Nelia Shi, MD by Sofie Rower. The patient was seen in room TR07C/TR07C and the patient's care was started at 12:27 PM      CSN: [id number]  Arrival date & time 06/17/12  1156   First MD Initiated Contact with Patient 06/17/12 1225      Chief Complaint  Patient presents with  . Asthma    (Consider location/radiation/quality/duration/timing/severity/associated sxs/prior treatment) Patient is a 31 y.o. female presenting with cough. The history is provided by the patient. No language interpreter was used.  Cough This is a new problem. The current episode started 3 to 5 hours ago. The problem has been gradually worsening. The cough is non-productive. There has been no fever. Pertinent negatives include no chest pain, no chills, no sweats, no sore throat and no myalgias. The treatment provided no relief. She is a smoker. Her past medical history is significant for asthma.    Stacy Blackwell is a 31 y.o. female who presents to the Emergency Department complaining of  Sudden, progressively worsening, cough onset today. The pt reports she has run out of her medications which she uses to manage her asthma.Modifying factors include taking prednisone (20 mg) once per day which provides moderate relief. The pt has a hx of asthma.   The pt is a current everyday smoker and drinks alcohol.     Past Medical History  Diagnosis Date  . Asthma     Past Surgical History  Procedure Date  . Cystectomy     No family history on file.  History  Substance Use Topics  . Smoking status: Current Everyday Smoker  . Smokeless tobacco: Not on file  . Alcohol Use: Yes    OB History    Grav Para Term Preterm Abortions TAB SAB Ect Mult Living                  Review of Systems  Constitutional: Negative for chills.  HENT: Negative for sore throat.   Respiratory: Positive for cough.   Cardiovascular: Negative for chest pain.  Musculoskeletal:  Negative for myalgias.  All other systems reviewed and are negative.    Allergies  Amoxicillin  Home Medications   Current Outpatient Rx  Name Route Sig Dispense Refill  . CETIRIZINE HCL 10 MG PO TABS Oral Take 10 mg by mouth daily as needed. For allergies    . ALBUTEROL SULFATE HFA 108 (90 BASE) MCG/ACT IN AERS Inhalation Inhale 2 puffs into the lungs every 4 (four) hours as needed. For shortness of breath 1 Inhaler 2  . FLUTICASONE-SALMETEROL 500-50 MCG/DOSE IN AEPB Inhalation Inhale 1 puff into the lungs every 12 (twelve) hours. 60 each 0  . PREDNISONE 10 MG PO TABS Oral Take 2 tablets (20 mg total) by mouth daily. 60 tablet 2    BP 124/70  Pulse 93  Temp 98.5 F (36.9 C) (Oral)  Resp 18  SpO2 99%  LMP 06/15/2012  Physical Exam  Nursing note and vitals reviewed. Constitutional: She is oriented to person, place, and time. She appears well-developed. No distress.  HENT:  Head: Normocephalic and atraumatic.  Eyes: Pupils are equal, round, and reactive to light.  Neck: Normal range of motion.  Cardiovascular: Normal rate and intact distal pulses.   Pulmonary/Chest: No respiratory distress. She has no wheezes. She has no rales.  Abdominal: Normal appearance. She exhibits no distension.  Musculoskeletal: Normal range of motion.  Neurological: She is alert and oriented to  person, place, and time. No cranial nerve deficit.  Skin: Skin is warm and dry. No rash noted.  Psychiatric: She has a normal mood and affect. Her behavior is normal.    ED Course  Procedures (including critical care time)  DIAGNOSTIC STUDIES: Oxygen Saturation is 99% on room air, normal by my interpretation.    COORDINATION OF CARE:    12:27AM- Refill of medications discussed. Pt agrees with treatment.   Labs Reviewed - No data to display No results found.   1. Asthma   2. Medication refill       MDM         I personally performed the services described in this documentation, which  was scribed in my presence. The recorded information has been reviewed and considered.    Nelia Shi, MD 06/18/12 [phone]

## 2012-06-17 NOTE — ED Notes (Addendum)
Pt c/o asthma flare up onset last night. Pt reports out of her Advair and Proair is not helping. Pt reports [facility] would not refill her Advair or prednisone during her last appointment. Pt talking in complete sentences. Pt with non productive cough. Pt also reports left hand swelling when she wakes up x 2 days.

## 2012-06-17 NOTE — ED Notes (Signed)
Pt states she started to wheeze and cough last night hurts to take a deep breath states is out of advair and has been taking her proair

## 2012-06-19 DIAGNOSIS — F329 Major depressive disorder, single episode, unspecified: Secondary | ICD-10-CM | POA: Diagnosis not present

## 2012-07-20 DIAGNOSIS — J45902 Unspecified asthma with status asthmaticus: Secondary | ICD-10-CM | POA: Diagnosis not present

## 2012-07-20 DIAGNOSIS — M79609 Pain in unspecified limb: Secondary | ICD-10-CM | POA: Diagnosis not present

## 2012-07-20 DIAGNOSIS — Z3202 Encounter for pregnancy test, result negative: Secondary | ICD-10-CM | POA: Diagnosis not present

## 2012-07-20 DIAGNOSIS — R42 Dizziness and giddiness: Secondary | ICD-10-CM | POA: Diagnosis not present

## 2012-07-23 DIAGNOSIS — J309 Allergic rhinitis, unspecified: Secondary | ICD-10-CM | POA: Diagnosis not present

## 2012-07-28 ENCOUNTER — Observation Stay (HOSPITAL_COMMUNITY)
Admission: EM | Admit: 2012-07-28 | Discharge: 2012-07-28 | Disposition: A | Discharge status: Home / Self Care | Payer: Medicare Other | Attending: Emergency Medicine | Admitting: Emergency Medicine

## 2012-07-28 ENCOUNTER — Observation Stay (HOSPITAL_COMMUNITY): Payer: Medicare Other

## 2012-07-28 ENCOUNTER — Encounter (HOSPITAL_COMMUNITY): Payer: Self-pay

## 2012-07-28 DIAGNOSIS — N760 Acute vaginitis: Secondary | ICD-10-CM | POA: Insufficient documentation

## 2012-07-28 DIAGNOSIS — R109 Unspecified abdominal pain: Secondary | ICD-10-CM | POA: Diagnosis not present

## 2012-07-28 DIAGNOSIS — N83209 Unspecified ovarian cyst, unspecified side: Secondary | ICD-10-CM | POA: Insufficient documentation

## 2012-07-28 DIAGNOSIS — A599 Trichomoniasis, unspecified: Secondary | ICD-10-CM

## 2012-07-28 DIAGNOSIS — B9689 Other specified bacterial agents as the cause of diseases classified elsewhere: Secondary | ICD-10-CM | POA: Insufficient documentation

## 2012-07-28 DIAGNOSIS — R1031 Right lower quadrant pain: Principal | ICD-10-CM | POA: Insufficient documentation

## 2012-07-28 DIAGNOSIS — A499 Bacterial infection, unspecified: Secondary | ICD-10-CM | POA: Diagnosis not present

## 2012-07-28 LAB — COMPREHENSIVE METABOLIC PANEL
AST: 13 U/L (ref 0–37)
Albumin: 3.8 g/dL (ref 3.5–5.2)
BUN: 9 mg/dL (ref 6–23)
Calcium: 9.8 mg/dL (ref 8.4–10.5)
Creatinine, Ser: 0.86 mg/dL (ref 0.50–1.10)
Total Protein: 7.4 g/dL (ref 6.0–8.3)

## 2012-07-28 LAB — CBC WITH DIFFERENTIAL/PLATELET
Basophils Absolute: 0 10*3/uL (ref 0.0–0.1)
Basophils Relative: 0 % (ref 0–1)
Eosinophils Absolute: 0 10*3/uL (ref 0.0–0.7)
HCT: 41.8 % (ref 36.0–46.0)
Hemoglobin: 14.2 g/dL (ref 12.0–15.0)
MCH: 28.7 pg (ref 26.0–34.0)
MCHC: 34 g/dL (ref 30.0–36.0)
Monocytes Absolute: 0.1 10*3/uL (ref 0.1–1.0)
Monocytes Relative: 1 % — ABNORMAL LOW (ref 3–12)
RDW: 13.7 % (ref 11.5–15.5)

## 2012-07-28 LAB — URINALYSIS, ROUTINE W REFLEX MICROSCOPIC
Bilirubin Urine: NEGATIVE
Glucose, UA: NEGATIVE mg/dL
Ketones, ur: NEGATIVE mg/dL
Leukocytes, UA: NEGATIVE
Nitrite: NEGATIVE
Protein, ur: NEGATIVE mg/dL
pH: 8 (ref 5.0–8.0)

## 2012-07-28 LAB — WET PREP, GENITAL: Yeast Wet Prep HPF POC: NONE SEEN

## 2012-07-28 LAB — LIPASE, BLOOD: Lipase: 21 U/L (ref 11–59)

## 2012-07-28 LAB — POCT PREGNANCY, URINE: Preg Test, Ur: NEGATIVE

## 2012-07-28 MED ORDER — MORPHINE SULFATE 4 MG/ML IJ SOLN
4.0000 mg | Freq: Once | INTRAMUSCULAR | Status: AC
  Administered 2012-07-28: 4 mg via INTRAVENOUS
  Filled 2012-07-28: qty 1

## 2012-07-28 MED ORDER — AZITHROMYCIN 250 MG PO TABS
1000.0000 mg | ORAL_TABLET | Freq: Once | ORAL | Status: AC
  Administered 2012-07-28: 1000 mg via ORAL
  Filled 2012-07-28: qty 4

## 2012-07-28 MED ORDER — METRONIDAZOLE 500 MG PO TABS: 500.0000 mg | ORAL_TABLET | Freq: Two times a day (BID) | ORAL | Status: DC

## 2012-07-28 MED ORDER — LIDOCAINE HCL (PF) 1 % IJ SOLN
INTRAMUSCULAR | Status: AC
  Administered 2012-07-28: 1 mL
  Filled 2012-07-28: qty 5

## 2012-07-28 MED ORDER — METRONIDAZOLE 500 MG PO TABS
500.0000 mg | ORAL_TABLET | Freq: Once | ORAL | Status: AC
  Administered 2012-07-28: 500 mg via ORAL
  Filled 2012-07-28: qty 1

## 2012-07-28 MED ORDER — SODIUM CHLORIDE 0.9 % IV SOLN
Freq: Once | INTRAVENOUS | Status: AC
  Administered 2012-07-28: 19:00:00 via INTRAVENOUS

## 2012-07-28 MED ORDER — CEFTRIAXONE SODIUM 250 MG IJ SOLR
250.0000 mg | Freq: Once | INTRAMUSCULAR | Status: AC
  Administered 2012-07-28: 250 mg via INTRAMUSCULAR
  Filled 2012-07-28: qty 250

## 2012-07-28 NOTE — ED Provider Notes (Signed)
Patient awaiting completion of diagnostic testing in the evaluation of abdominal pain.  Pain in R adnexal area, vaginal discharge noted on pelvic.  Wet prep indicates trich and bacterial vaginosis.  U/S reveals complex left ovarian cyst that will need repeat evaluation in 6 weeks.  Results shared with patient and discussed with Dr. Ethelda Chick.  Patient states her pain is currently controlled.  Will treat as STD with rocephin, azithromycin, and add flagyl.  Patient to follow-up with gynecology in 6 weeks for repeat evaluation.  Jimmye Norman, NP 07/28/12 2131

## 2012-07-28 NOTE — ED Provider Notes (Addendum)
History     CSN: [id number]  Arrival date & time 07/28/12  1255   First MD Initiated Contact with Patient 07/28/12 1701      Chief Complaint  Patient presents with  . Abdominal Pain    (Consider location/radiation/quality/duration/timing/severity/associated sxs/prior treatment) HPI Complains of right lower quadrant pain sudden onset 11 AM today pain is nonradiating sharp severe worse with  movement or changing positions improved with remaining still last bowel movement today normal no nausea or vomiting no anorexia no vaginal discharge no urinary symptoms no other associated symptoms no treatment prior to coming in Past Medical History  Diagnosis Date  . Asthma     Past Surgical History  Procedure Date  . Cystectomy     No family history on file.  History  Substance Use Topics  . Smoking status: Current Every Day Smoker  . Smokeless tobacco: Not on file  . Alcohol Use: Yes    OB History    Grav Para Term Preterm Abortions TAB SAB Ect Mult Living                  Review of Systems  Constitutional: Negative.   HENT: Negative.   Respiratory: Negative.   Cardiovascular: Negative.   Gastrointestinal: Positive for abdominal pain.  Musculoskeletal: Negative.   Skin: Negative.   Neurological: Negative.   Hematological: Negative.   Psychiatric/Behavioral: Negative.     Allergies  Amoxicillin  Home Medications   Current Outpatient Rx  Name Route Sig Dispense Refill  . ALBUTEROL SULFATE HFA 108 (90 BASE) MCG/ACT IN AERS Inhalation Inhale 2 puffs into the lungs every 4 (four) hours as needed. For shortness of breath    . CETIRIZINE HCL 10 MG PO TABS Oral Take 10 mg by mouth daily as needed. For allergies    . MECLIZINE HCL 12.5 MG PO TABS Oral Take 12.5 mg by mouth every 8 (eight) hours as needed. For dizziness    . PREDNISONE 10 MG PO TABS Oral Take 20 mg by mouth daily.      BP 122/60  Pulse 82  Temp 97.1 F (36.2 C) (Oral)  Resp 20  SpO2  100%  Physical Exam  Nursing note and vitals reviewed. Constitutional: She appears well-developed and well-nourished.  HENT:  Head: Normocephalic and atraumatic.  Eyes: Conjunctivae normal are normal. Pupils are equal, round, and reactive to light.  Neck: Neck supple. No tracheal deviation present. No thyromegaly present.  Cardiovascular: Normal rate and regular rhythm.   No murmur heard. Pulmonary/Chest: Effort normal and breath sounds normal.  Abdominal: Soft. Bowel sounds are normal. She exhibits no distension and no mass. There is tenderness. There is no rebound and no guarding.       Morbidly obese mildly tender right lower quadrant  Genitourinary:       No external lesion yellowish discharge cervical os closed no cervical motion tenderness positive right adnexal tenderness  Musculoskeletal: Normal range of motion. She exhibits no edema and no tenderness.  Neurological: She is alert. Coordination normal.  Skin: Skin is warm and dry. No rash noted.  Psychiatric: She has a normal mood and affect.    ED Course  Procedures (including critical care time)  Labs Reviewed  CBC WITH DIFFERENTIAL - Abnormal; Notable for the following:    Neutrophils Relative 84 (*)     Monocytes Relative 1 (*)     All other components within normal limits  COMPREHENSIVE METABOLIC PANEL - Abnormal; Notable for the following:  Glucose, Bld 101 (*)     GFR calc non Af Amer 89 (*)     All other components within normal limits  URINALYSIS, ROUTINE W REFLEX MICROSCOPIC  LIPASE, BLOOD  POCT PREGNANCY, URINE   No results found.  6:15 PM pain much improved after treatment with morphine and after patient expelled gas per rectum pain is minimal at present No diagnosis found.   Results for orders placed during the hospital encounter of 07/28/12  URINALYSIS, ROUTINE W REFLEX MICROSCOPIC      Component Value Range   Color, Urine YELLOW  YELLOW   APPearance CLEAR  CLEAR   Specific Gravity, Urine 1.015   1.005 - 1.030   pH 8.0  5.0 - 8.0   Glucose, UA NEGATIVE  NEGATIVE mg/dL   Hgb urine dipstick NEGATIVE  NEGATIVE   Bilirubin Urine NEGATIVE  NEGATIVE   Ketones, ur NEGATIVE  NEGATIVE mg/dL   Protein, ur NEGATIVE  NEGATIVE mg/dL   Urobilinogen, UA 0.2  0.0 - 1.0 mg/dL   Nitrite NEGATIVE  NEGATIVE   Leukocytes, UA NEGATIVE  NEGATIVE  CBC WITH DIFFERENTIAL      Component Value Range   WBC 7.9  4.0 - 10.5 K/uL   RBC 4.94  3.87 - 5.11 MIL/uL   Hemoglobin 14.2  12.0 - 15.0 g/dL   HCT 16.1  09.6 - 04.5 %   MCV 84.6  78.0 - 100.0 fL   MCH 28.7  26.0 - 34.0 pg   MCHC 34.0  30.0 - 36.0 g/dL   RDW 40.9  81.1 - 91.4 %   Platelets 248  150 - 400 K/uL   Neutrophils Relative 84 (*) 43 - 77 %   Neutro Abs 6.7  1.7 - 7.7 K/uL   Lymphocytes Relative 14  12 - 46 %   Lymphs Abs 1.1  0.7 - 4.0 K/uL   Monocytes Relative 1 (*) 3 - 12 %   Monocytes Absolute 0.1  0.1 - 1.0 K/uL   Eosinophils Relative 0  0 - 5 %   Eosinophils Absolute 0.0  0.0 - 0.7 K/uL   Basophils Relative 0  0 - 1 %   Basophils Absolute 0.0  0.0 - 0.1 K/uL  COMPREHENSIVE METABOLIC PANEL      Component Value Range   Sodium 135  135 - 145 mEq/L   Potassium 3.9  3.5 - 5.1 mEq/L   Chloride 104  96 - 112 mEq/L   CO2 19  19 - 32 mEq/L   Glucose, Bld 101 (*) 70 - 99 mg/dL   BUN 9  6 - 23 mg/dL   Creatinine, Ser 7.82  0.50 - 1.10 mg/dL   Calcium 9.8  8.4 - 95.6 mg/dL   Total Protein 7.4  6.0 - 8.3 g/dL   Albumin 3.8  3.5 - 5.2 g/dL   AST 13  0 - 37 U/L   ALT 12  0 - 35 U/L   Alkaline Phosphatase 53  39 - 117 U/L   Total Bilirubin 0.5  0.3 - 1.2 mg/dL   GFR calc non Af Amer 89 (*) >90 mL/min   GFR calc Af Amer >90  >90 mL/min  LIPASE, BLOOD      Component Value Range   Lipase 21  11 - 59 U/L  POCT PREGNANCY, URINE      Component Value Range   Preg Test, Ur NEGATIVE  NEGATIVE  WET PREP, GENITAL      Component Value Range   Yeast  Wet Prep HPF POC NONE SEEN  NONE SEEN   Trich, Wet Prep FEW (*) NONE SEEN   Clue Cells Wet Prep  HPF POC FEW (*) NONE SEEN   WBC, Wet Prep HPF POC FEW (*) NONE SEEN   US Transvaginal Non-ob  07/28/2012  *RADIOLOGY REPORT*  Clinical Data: Right lower quadrant abdominal pain.  TRANSABDOMINAL AND TRANSVAGINAL ULTRASOUND OF PELVIS Technique:  Both transabdominal and transvaginal ultrasound examinations of the pelvis were performed. Transabdominal technique was performed for global imaging of the pelvis including uterus, ovaries, adnexal regions, and pelvic cul-de-sac.  It was necessary to proceed with endovaginal exam following the transabdominal exam to visualize the uterus, endometrium, and ovaries.  Comparison:  05/28/2010  Findings:  Uterus: Uterus measures 6.2 x 3.6 x 4.6 cm.  No significant myometrial abnormality observed.  Endometrium: Measures 5 mm in thickness, within normal limits.  Right ovary:  Measures 2.3 x 1.4 x 1.6 cm and appears normal.  Left ovary: Measures 4.1 x 3.1 x 3.4 cm and contains an internal complex and septated cystic lesion measuring 3.4 x 2.3 x 3.0 cm.  Other findings: There is a small amount of free pelvic fluid in the cul-de-sac.  IMPRESSION:  1.  Small but abnormal amount of free pelvic fluid. 2.  Complex cystic lesion of the left ovary.  This may represent a hemorrhagic cyst or collapsed cyst.  Follow-up sonography in 6 weeks time is recommended to assess for change/resolution and exclude the unlikely possibility mass.  Pregnancy test is negative today and therefore ectopic pregnancy is not of particular concern.   Original Report Authenticated By: Dellia Cloud, M.D.    US Pelvis Complete  07/28/2012  *RADIOLOGY REPORT*  Clinical Data: Right lower quadrant abdominal pain.  TRANSABDOMINAL AND TRANSVAGINAL ULTRASOUND OF PELVIS Technique:  Both transabdominal and transvaginal ultrasound examinations of the pelvis were performed. Transabdominal technique was performed for global imaging of the pelvis including uterus, ovaries, adnexal regions, and pelvic cul-de-sac.  It  was necessary to proceed with endovaginal exam following the transabdominal exam to visualize the uterus, endometrium, and ovaries.  Comparison:  05/28/2010  Findings:  Uterus: Uterus measures 6.2 x 3.6 x 4.6 cm.  No significant myometrial abnormality observed.  Endometrium: Measures 5 mm in thickness, within normal limits.  Right ovary:  Measures 2.3 x 1.4 x 1.6 cm and appears normal.  Left ovary: Measures 4.1 x 3.1 x 3.4 cm and contains an internal complex and septated cystic lesion measuring 3.4 x 2.3 x 3.0 cm.  Other findings: There is a small amount of free pelvic fluid in the cul-de-sac.  IMPRESSION:  1.  Small but abnormal amount of free pelvic fluid. 2.  Complex cystic lesion of the left ovary.  This may represent a hemorrhagic cyst or collapsed cyst.  Follow-up sonography in 6 weeks time is recommended to assess for change/resolution and exclude the unlikely possibility mass.  Pregnancy test is negative today and therefore ectopic pregnancy is not of particular concern.   Original Report Authenticated By: Dellia Cloud, M.D.     MDM  To send patient for pelvic ultrasound in light of her Trichomonas adnexal tenderness feel that STD is likely etiology of her pain        Doug Sou, MD 07/29/12 1610  Doug Sou, MD 07/29/12 0126

## 2012-07-28 NOTE — ED Notes (Signed)
Pt here for abd pain, by ems, started 1 hour ago, pain with palpation, 118 sbp palpated, rr 22 unlabored

## 2012-07-29 DIAGNOSIS — J45902 Unspecified asthma with status asthmaticus: Secondary | ICD-10-CM | POA: Diagnosis not present

## 2012-07-29 DIAGNOSIS — N739 Female pelvic inflammatory disease, unspecified: Secondary | ICD-10-CM | POA: Diagnosis not present

## 2012-07-29 LAB — GC/CHLAMYDIA PROBE AMP, GENITAL: GC Probe Amp, Genital: NEGATIVE

## 2012-07-29 LAB — RPR: RPR Ser Ql: NONREACTIVE

## 2012-07-29 NOTE — ED Provider Notes (Signed)
Medical screening examination/treatment/procedure(s) were conducted as a shared visit with non-physician practitioner(s) and myself.  I personally evaluated the patient during the encounter  Doug Sou, MD 07/29/12 0126

## 2012-07-30 DIAGNOSIS — J3089 Other allergic rhinitis: Secondary | ICD-10-CM | POA: Diagnosis not present

## 2012-07-30 DIAGNOSIS — J3081 Allergic rhinitis due to animal (cat) (dog) hair and dander: Secondary | ICD-10-CM | POA: Diagnosis not present

## 2012-07-30 DIAGNOSIS — J301 Allergic rhinitis due to pollen: Secondary | ICD-10-CM | POA: Diagnosis not present

## 2012-08-19 DIAGNOSIS — J019 Acute sinusitis, unspecified: Secondary | ICD-10-CM | POA: Diagnosis not present

## 2012-08-19 DIAGNOSIS — F17200 Nicotine dependence, unspecified, uncomplicated: Secondary | ICD-10-CM | POA: Diagnosis not present

## 2012-08-19 DIAGNOSIS — J209 Acute bronchitis, unspecified: Secondary | ICD-10-CM | POA: Diagnosis not present

## 2012-08-31 DIAGNOSIS — J3081 Allergic rhinitis due to animal (cat) (dog) hair and dander: Secondary | ICD-10-CM | POA: Diagnosis not present

## 2012-08-31 DIAGNOSIS — J301 Allergic rhinitis due to pollen: Secondary | ICD-10-CM | POA: Diagnosis not present

## 2012-08-31 DIAGNOSIS — J3089 Other allergic rhinitis: Secondary | ICD-10-CM | POA: Diagnosis not present

## 2012-09-16 DIAGNOSIS — J45902 Unspecified asthma with status asthmaticus: Secondary | ICD-10-CM | POA: Diagnosis not present

## 2012-09-16 DIAGNOSIS — F17200 Nicotine dependence, unspecified, uncomplicated: Secondary | ICD-10-CM | POA: Diagnosis not present

## 2012-09-16 DIAGNOSIS — Z6841 Body Mass Index (BMI) 40.0 and over, adult: Secondary | ICD-10-CM | POA: Diagnosis not present

## 2012-09-16 DIAGNOSIS — J3089 Other allergic rhinitis: Secondary | ICD-10-CM | POA: Diagnosis not present

## 2012-09-28 ENCOUNTER — Emergency Department (HOSPITAL_COMMUNITY)
Admission: EM | Admit: 2012-09-28 | Discharge: 2012-09-28 | Disposition: A | Discharge status: Home / Self Care | Payer: Medicare Other | Attending: Emergency Medicine | Admitting: Emergency Medicine

## 2012-09-28 ENCOUNTER — Encounter (HOSPITAL_COMMUNITY): Payer: Self-pay | Admitting: Emergency Medicine

## 2012-09-28 DIAGNOSIS — R61 Generalized hyperhidrosis: Secondary | ICD-10-CM | POA: Diagnosis not present

## 2012-09-28 DIAGNOSIS — K602 Anal fissure, unspecified: Secondary | ICD-10-CM | POA: Diagnosis not present

## 2012-09-28 DIAGNOSIS — R079 Chest pain, unspecified: Secondary | ICD-10-CM | POA: Diagnosis not present

## 2012-09-28 DIAGNOSIS — Z79899 Other long term (current) drug therapy: Secondary | ICD-10-CM | POA: Diagnosis not present

## 2012-09-28 DIAGNOSIS — F17200 Nicotine dependence, unspecified, uncomplicated: Secondary | ICD-10-CM | POA: Diagnosis not present

## 2012-09-28 DIAGNOSIS — K6289 Other specified diseases of anus and rectum: Secondary | ICD-10-CM

## 2012-09-28 DIAGNOSIS — R42 Dizziness and giddiness: Secondary | ICD-10-CM | POA: Insufficient documentation

## 2012-09-28 DIAGNOSIS — J45909 Unspecified asthma, uncomplicated: Secondary | ICD-10-CM | POA: Insufficient documentation

## 2012-09-28 MED ORDER — IPRATROPIUM BROMIDE 0.02 % IN SOLN
0.5000 mg | RESPIRATORY_TRACT | Status: DC
  Administered 2012-09-28: 0.5 mg via RESPIRATORY_TRACT
  Filled 2012-09-28: qty 2.5

## 2012-09-28 MED ORDER — BISACODYL 5 MG PO TBEC: 5.0000 mg | DELAYED_RELEASE_TABLET | Freq: Two times a day (BID) | ORAL | Status: DC

## 2012-09-28 MED ORDER — ALBUTEROL SULFATE (5 MG/ML) 0.5% IN NEBU
2.5000 mg | INHALATION_SOLUTION | RESPIRATORY_TRACT | Status: DC
  Administered 2012-09-28: 2.5 mg via RESPIRATORY_TRACT
  Filled 2012-09-28: qty 0.5

## 2012-09-28 MED ORDER — AEROCHAMBER PLUS W/MASK MISC
Status: AC
  Filled 2012-09-28: qty 1

## 2012-09-28 MED ORDER — POLYETHYLENE GLYCOL 3350 17 GM/SCOOP PO POWD: 17.0000 g | Freq: Two times a day (BID) | ORAL | Status: DC

## 2012-09-28 MED ORDER — AEROCHAMBER PLUS FLO-VU LARGE MISC
1.0000 | Freq: Once | Status: AC
  Administered 2012-09-28: 1
  Filled 2012-09-28: qty 1

## 2012-09-28 MED ORDER — LIDOCAINE-PRILOCAINE 2.5-2.5 % EX CREA
TOPICAL_CREAM | Freq: Once | CUTANEOUS | Status: AC
  Administered 2012-09-28: 09:00:00 via TOPICAL
  Filled 2012-09-28: qty 5

## 2012-09-28 NOTE — ED Notes (Signed)
Pt reports that she has a "sore between her rectum and vagina". Onset yesterday. States is very painful.   Also, states she needs a breathing tx for her asthma. Mild expiratory wheezes noted.

## 2012-09-28 NOTE — ED Provider Notes (Addendum)
History   This chart was scribed for Stacy Skene, MD by Stacy Blackwell, ED Scribe. This patient was seen in room TR06C/TR06C and the patient's care was started at 8:26AM.   CSN: [id number]  Arrival date & time 09/28/12  0811   None     No chief complaint on file.    The history is provided by the patient. No language interpreter was used.    Stacy Blackwell is a 31 y.o. female who presents to the Emergency Department complaining of a new, painful spot located near anus starting 3 days ago. It is moderate to severe. She has been constipated. She denies any previous h/o abscesses. Pt has associated body sweats and mild chest pain from her asthma. Pt denies any fever, chills, vaginal discharge, vaginal pain, abdominal pain, diarrhea, vomiting, muscular aches. Pt reports that her asthma has been "acting up".  Inhaler is "not working" and she doesn't use a spacer with her inhaler.   Pt has a h/o asthma, vertigo, and a cystectomy.  Pt is a current everyday smoker and occasional alcohol user. Past Medical History  Diagnosis Date  . Asthma     Past Surgical History  Procedure Date  . Cystectomy     No family history on file.  History  Substance Use Topics  . Smoking status: Current Every Day Smoker  . Smokeless tobacco: Not on file  . Alcohol Use: Yes    No OB history provided.   Review of Systems  .At least 10pt or greater review of systems completed and are negative except where specified in the HPI.   Allergies  Amoxicillin  Home Medications   Current Outpatient Rx  Name  Route  Sig  Dispense  Refill  . ALBUTEROL SULFATE HFA 108 (90 BASE) MCG/ACT IN AERS   Inhalation   Inhale 2 puffs into the lungs every 4 (four) hours as needed. For shortness of breath         . CETIRIZINE HCL 10 MG PO TABS   Oral   Take 10 mg by mouth daily as needed. For allergies         . MECLIZINE HCL 12.5 MG PO TABS   Oral   Take 12.5 mg by mouth every 8 (eight) hours as  needed. For dizziness         . METRONIDAZOLE 500 MG PO TABS   Oral   Take 1 tablet (500 mg total) by mouth 2 (two) times daily.   13 tablet   0   . PREDNISONE 10 MG PO TABS   Oral   Take 20 mg by mouth daily.           BP 125/80  Pulse 96  Temp 98.3 F (36.8 C) (Oral)  Resp 20  SpO2 99%  LMP 08/27/2012  Physical Exam  Genitourinary:        Nursing notes reviewed.  Electronic medical record reviewed. VITAL SIGNS:   Filed Vitals:   09/28/12 0820  BP: 125/80  Pulse: 96  Temp: 98.3 F (36.8 C)  TempSrc: Oral  Resp: 20  SpO2: 99%   CONSTITUTIONAL: Awake, oriented, appears non-toxic HENT: Atraumatic, normocephalic, oral mucosa pink and moist, airway patent. Nares patent without drainage. External ears normal. EYES: Conjunctiva clear, EOMI, PERRLA NECK: Trachea midline, non-tender, supple CARDIOVASCULAR: Normal heart rate, Normal rhythm, No murmurs, rubs, gallops PULMONARY/CHEST: Diffuse mild wheezing throughout, good air movement bilaterally. No focal areas of consolidation. Symmetrical breath sounds. Non-tender. ABDOMINAL: Non-distended, soft, non-tender -  no rebound or guarding.  BS normal. Rectal: Small lima bean shaped indurated area, no surrounding erythema or cellulitis, no drainage, no fluctuance. Includes skin of anus. Rectum is nontender, no fluctuance and the rectal wall, Stool is negative for gross blood.  NEUROLOGIC: Non-focal, moving all four extremities, no gross sensory or motor deficits. EXTREMITIES: No clubbing, cyanosis, or edema SKIN: Warm, Dry, No erythema, No rash   ED Course  [place] bedside Performed by: Stacy Blackwell Authorized by: Stacy Blackwell Consent: Verbal consent obtained. Comments: Ultrasound of abnormality patient's anus shows a hyperechoic lesion without a central area of of fluid. There is no cellulitis or cellulitic changes in the tissue. Findings are not consistent with abscess at this time.   (including critical care  time)  DIAGNOSTIC STUDIES: Oxygen Saturation is 99% on room air, normal by my interpretation.    COORDINATION OF CARE:  8:35 AM Discussed treatment plan which includes hot compresses with pt at bedside and pt agreed to plan.    Labs Reviewed - No data to display No results found.   1. Anal pain   2. Asthma, mild       MDM  Stacy Blackwell is a 31 y.o. female presenting with a lesion to her anus and perianal region. At this time there is no evidence for abscess requiring incision and drainage - ultrasound of the area shows no central area of hyperechoic signal and no surrounding cellulitic changes.  This could be an atypically looking hemorrhoid - thrombosed.  Again no point in I and D.  we'll treat the patient's mild asthma exacerbation with DuoNeb and give the patient a spacer for use with her home inhaler. Treatment plan at this point will be to use hot compresses for sitz baths throughout the day over the next few days to encourage healing in the area. Also give the patient some EMLA cream for discomfort.  We'll put her on a bowel regimen including GlycoLax and stool softemers because she complains about frequent constipation.  Patient occurs to return to the ER if the lesion gets larger, more painful, starts draining, she is fevers, chills or any other concerning symptoms.  I explained the diagnosis and have given explicit precautions to return to the ER including any other new or worsening symptoms as mentioned above. The patient understands and accepts the medical plan as it's been dictated and I have answered their questions. Discharge instructions concerning home care and prescriptions have been given.  The patient is STABLE and is discharged to home in good condition.   I personally performed the services described in this documentation, which was scribed in my presence. The recorded information has been reviewed and is accurate. Stacy Blackwell, M.D.  Stacy Skene,  MD 09/28/12 1610  Stacy Skene, MD 09/28/12 9604

## 2012-09-28 NOTE — ED Notes (Signed)
Pt was supposed to start her period Saturday but did not. Does not use BC so could be pregnant.

## 2012-09-29 ENCOUNTER — Encounter (HOSPITAL_COMMUNITY): Payer: Self-pay | Admitting: *Deleted

## 2012-09-29 ENCOUNTER — Emergency Department (HOSPITAL_COMMUNITY)
Admission: EM | Admit: 2012-09-29 | Discharge: 2012-09-29 | Disposition: A | Discharge status: Home / Self Care | Payer: Medicare Other | Attending: Emergency Medicine | Admitting: Emergency Medicine

## 2012-09-29 DIAGNOSIS — K602 Anal fissure, unspecified: Secondary | ICD-10-CM | POA: Diagnosis not present

## 2012-09-29 DIAGNOSIS — K6289 Other specified diseases of anus and rectum: Secondary | ICD-10-CM | POA: Diagnosis not present

## 2012-09-29 DIAGNOSIS — F17200 Nicotine dependence, unspecified, uncomplicated: Secondary | ICD-10-CM | POA: Diagnosis not present

## 2012-09-29 DIAGNOSIS — Z79899 Other long term (current) drug therapy: Secondary | ICD-10-CM | POA: Insufficient documentation

## 2012-09-29 DIAGNOSIS — J45909 Unspecified asthma, uncomplicated: Secondary | ICD-10-CM | POA: Insufficient documentation

## 2012-09-29 MED ORDER — TRAMADOL HCL 50 MG PO TABS: 50.0000 mg | ORAL_TABLET | Freq: Four times a day (QID) | ORAL | Status: DC | PRN

## 2012-09-29 NOTE — ED Notes (Signed)
Pt ambulatory leaving ED with d/c teaching and prescription. Pt has no further questions upon d/c. Pt does not appear to be in any acute distress upon d/c.

## 2012-09-29 NOTE — ED Notes (Signed)
Dr Yelverton at bedside.  

## 2012-09-29 NOTE — ED Provider Notes (Signed)
History  This chart was scribed for Stacy Racer, MD by Manuela Schwartz, ED scribe. This patient was seen in room TR10C/TR10C and the patient's care was started at 1729.   CSN: [id number]  Arrival date & time 09/29/12  1729   First MD Initiated Contact with Patient 09/29/12 1757      Chief Complaint  Patient presents with  . Hemorrhoids   The history is provided by the patient. No language interpreter was used.   Stacy Blackwell is a 31 y.o. female who presents to the Emergency Department complaining of increased rectal pain since she was seen here last PM and was told she had hemorrhoids. She states rx she was given is not helping with pain and she has also tried warm baths at home which have also not improved her symptoms.   Past Medical History  Diagnosis Date  . Asthma     Past Surgical History  Procedure Date  . Cystectomy     No family history on file.  History  Substance Use Topics  . Smoking status: Current Every Day Smoker  . Smokeless tobacco: Not on file  . Alcohol Use: Yes    OB History    Grav Para Term Preterm Abortions TAB SAB Ect Mult Living                  Review of Systems  Constitutional: Negative for fever and chills.  Respiratory: Negative for shortness of breath.   Gastrointestinal: Positive for rectal pain. Negative for nausea and vomiting.  Neurological: Negative for weakness.  All other systems reviewed and are negative.    Allergies  Amoxicillin  Home Medications   Current Outpatient Rx  Name  Route  Sig  Dispense  Refill  . ALBUTEROL SULFATE HFA 108 (90 BASE) MCG/ACT IN AERS   Inhalation   Inhale 2 puffs into the lungs every 4 (four) hours as needed. For shortness of breath         . BECLOMETHASONE DIPROPIONATE 40 MCG/ACT IN AERS   Inhalation   Inhale 2 puffs into the lungs 2 (two) times daily.         Marland Kitchen CETIRIZINE HCL 10 MG PO TABS   Oral   Take 10 mg by mouth daily as needed. For allergies         .  MECLIZINE HCL 12.5 MG PO TABS   Oral   Take 12.5 mg by mouth every 8 (eight) hours as needed. For dizziness         . PREDNISONE 10 MG PO TABS   Oral   Take 20 mg by mouth daily.         . TRAMADOL HCL 50 MG PO TABS   Oral   Take 1 tablet (50 mg total) by mouth every 6 (six) hours as needed for pain.   15 tablet   0     Triage Vitals: BP 112/43  Pulse 82  Temp 97.6 F (36.4 C)  Resp 20  SpO2 98%  LMP 08/27/2012  Physical Exam  Nursing note and vitals reviewed. Constitutional: She is oriented to person, place, and time. She appears well-developed and well-nourished. No distress.  HENT:  Head: Normocephalic and atraumatic.  Eyes: EOM are normal.  Neck: Neck supple. No tracheal deviation present.  Cardiovascular: Normal rate.   Pulmonary/Chest: Effort normal. No respiratory distress.  Genitourinary:          Anterior anal fissure about 0.5 cm in size when partially opening  the rectum. Another area unrelated to anal fissure which is outside of rectum but also anterior which is about 0.5 cm and tender to palpation.   Musculoskeletal: Normal range of motion.  Neurological: She is alert and oriented to person, place, and time.  Skin: Skin is warm and dry.  Psychiatric: She has a normal mood and affect. Her behavior is normal.    ED Course  Procedures (including critical care time) DIAGNOSTIC STUDIES: Oxygen Saturation is 98% on room air, normal by my interpretation.    COORDINATION OF CARE: At 615 PM Discussed treatment plan with patient which includes rectal exam. Patient agrees.   Labs Reviewed - No data to display No results found.   1. Anal fissure       MDM  I personally performed the services described in this documentation, which was scribed in my presence. The recorded information has been reviewed and is accurate.           Stacy Racer, MD 09/29/12 Jerene Bears

## 2012-09-29 NOTE — ED Notes (Signed)
Pt states that stool is soft but she is afraid to go to the bathroom because she does not want to wipe; pt states that she does not think what she was given last night is not helping; pt denies nausea and stomach pain.

## 2012-09-29 NOTE — ED Notes (Signed)
The pt was seen here last pm for hemorrhoids and the med she was given is not helping

## 2012-09-30 DIAGNOSIS — J3081 Allergic rhinitis due to animal (cat) (dog) hair and dander: Secondary | ICD-10-CM | POA: Diagnosis not present

## 2012-09-30 DIAGNOSIS — J3089 Other allergic rhinitis: Secondary | ICD-10-CM | POA: Diagnosis not present

## 2012-09-30 DIAGNOSIS — J301 Allergic rhinitis due to pollen: Secondary | ICD-10-CM | POA: Diagnosis not present

## 2012-10-08 ENCOUNTER — Encounter (HOSPITAL_COMMUNITY): Payer: Self-pay | Admitting: Emergency Medicine

## 2012-10-08 ENCOUNTER — Emergency Department (INDEPENDENT_AMBULATORY_CARE_PROVIDER_SITE_OTHER)
Admission: EM | Admit: 2012-10-08 | Discharge: 2012-10-08 | Disposition: A | Discharge status: Home / Self Care | Payer: Medicare Other | Source: Home / Self Care

## 2012-10-08 DIAGNOSIS — J45909 Unspecified asthma, uncomplicated: Secondary | ICD-10-CM

## 2012-10-08 DIAGNOSIS — J45901 Unspecified asthma with (acute) exacerbation: Secondary | ICD-10-CM

## 2012-10-08 MED ORDER — ALBUTEROL SULFATE HFA 108 (90 BASE) MCG/ACT IN AERS
INHALATION_SPRAY | RESPIRATORY_TRACT | Status: AC
  Filled 2012-10-08: qty 6.7

## 2012-10-08 MED ORDER — ALBUTEROL SULFATE (5 MG/ML) 0.5% IN NEBU
2.5000 mg | INHALATION_SOLUTION | Freq: Once | RESPIRATORY_TRACT | Status: AC
  Administered 2012-10-08: 2.5 mg via RESPIRATORY_TRACT

## 2012-10-08 MED ORDER — ALBUTEROL SULFATE HFA 108 (90 BASE) MCG/ACT IN AERS
2.0000 | INHALATION_SPRAY | RESPIRATORY_TRACT | Status: DC | PRN
  Administered 2012-10-08: 2 via RESPIRATORY_TRACT

## 2012-10-08 MED ORDER — ALBUTEROL SULFATE (5 MG/ML) 0.5% IN NEBU
INHALATION_SOLUTION | RESPIRATORY_TRACT | Status: AC
  Filled 2012-10-08: qty 0.5

## 2012-10-08 MED ORDER — TRIAMCINOLONE ACETONIDE 40 MG/ML IJ SUSP
INTRAMUSCULAR | Status: AC
  Filled 2012-10-08: qty 5

## 2012-10-08 MED ORDER — TRIAMCINOLONE ACETONIDE 40 MG/ML IJ SUSP
60.0000 mg | Freq: Once | INTRAMUSCULAR | Status: AC
  Administered 2012-10-08: 60 mg via INTRAMUSCULAR

## 2012-10-08 MED ORDER — IPRATROPIUM BROMIDE 0.02 % IN SOLN
0.5000 mg | Freq: Once | RESPIRATORY_TRACT | Status: AC
  Administered 2012-10-08: 0.5 mg via RESPIRATORY_TRACT

## 2012-10-08 MED ORDER — BECLOMETHASONE DIPROPIONATE 80 MCG/ACT IN AERS: 2.0000 | INHALATION_SPRAY | Freq: Two times a day (BID) | RESPIRATORY_TRACT | Status: DC

## 2012-10-08 NOTE — ED Provider Notes (Signed)
History     CSN: [id number]  Arrival date & time 10/08/12  1204   None     Chief Complaint  Patient presents with  . URI    (Consider location/radiation/quality/duration/timing/severity/associated sxs/prior treatment) HPI Comments: 31 year old female with a history of asthma presents with a worsening cough over the past 2-3 days. Is also experiencing anterior chest pain that is associated only with cough. She does have some nausea whenever she eats but no vomiting. She denies fever, sore throat or ear ache. Her usual treatment for asthma is albuterol HFA and prednisone. She states she is just recently started taking prednisone. And she is using her albuterol about every 2 hours now   Past Medical History  Diagnosis Date  . Asthma     Past Surgical History  Procedure Date  . Cystectomy     History reviewed. No pertinent family history.  History  Substance Use Topics  . Smoking status: Current Every Day Smoker  . Smokeless tobacco: Not on file  . Alcohol Use: Yes    OB History    Grav Para Term Preterm Abortions TAB SAB Ect Mult Living                  Review of Systems  Constitutional: Negative for fever, chills, activity change, appetite change and fatigue.  HENT: Negative for congestion, sore throat, facial swelling, rhinorrhea, neck pain, neck stiffness and postnasal drip.   Eyes: Negative.   Respiratory: Positive for cough and wheezing.   Cardiovascular: Negative.   Skin: Negative for pallor and rash.  Neurological: Negative.     Allergies  Amoxicillin  Home Medications   Current Outpatient Rx  Name  Route  Sig  Dispense  Refill  . ALBUTEROL SULFATE HFA 108 (90 BASE) MCG/ACT IN AERS   Inhalation   Inhale 2 puffs into the lungs every 4 (four) hours as needed. For shortness of breath         . BECLOMETHASONE DIPROPIONATE 40 MCG/ACT IN AERS   Inhalation   Inhale 2 puffs into the lungs 2 (two) times daily.         . BECLOMETHASONE DIPROPIONATE  80 MCG/ACT IN AERS   Inhalation   Inhale 2 puffs into the lungs 2 (two) times daily.   1 Inhaler   0   . CETIRIZINE HCL 10 MG PO TABS   Oral   Take 10 mg by mouth daily as needed. For allergies         . MECLIZINE HCL 12.5 MG PO TABS   Oral   Take 12.5 mg by mouth every 8 (eight) hours as needed. For dizziness         . PREDNISONE 10 MG PO TABS   Oral   Take 20 mg by mouth daily.         . TRAMADOL HCL 50 MG PO TABS   Oral   Take 1 tablet (50 mg total) by mouth every 6 (six) hours as needed for pain.   15 tablet   0     BP 116/73  Pulse 83  Temp 98.2 F (36.8 C) (Oral)  Resp 18  SpO2 97%  LMP 08/27/2012  Physical Exam  Constitutional: She is oriented to person, place, and time. She appears well-developed and well-nourished. No distress.  HENT:       Bilateral TMs are normal Oropharynx is clear, moist without exudates or swelling.  Eyes: Conjunctivae normal and EOM are normal.  Neck: Normal range of  motion. Neck supple.  Cardiovascular: Normal rate, regular rhythm and normal heart sounds.   Pulmonary/Chest: Effort normal. No respiratory distress. She has wheezes.       Bilateral expiratory wheezes with prolonged expiratory phase. Attempts to take a deep breath produces coughing.  Musculoskeletal: Normal range of motion. She exhibits no edema.  Lymphadenopathy:    She has no cervical adenopathy.  Neurological: She is alert and oriented to person, place, and time.  Skin: Skin is warm and dry. No rash noted.  Psychiatric: She has a normal mood and affect.    ED Course  Procedures (including critical care time)  Labs Reviewed - No data to display No results found.   1. Asthma attack       MDM  Transfer of care from me to Dr. Lorenz Coaster at [phone]. Report given and agrees to accept ongoing care. T this time the Duoneb and kenalog has been ordered but not administered. DMabe, NP  See note by Dr. Lorenz Coaster reference outcome and discharge  meds.        Hayden Rasmussen, NP 10/09/12 2034

## 2012-10-08 NOTE — ED Provider Notes (Signed)
Addendum: After the patient receives the DuoNeb breathing treatment and the Depo-Medrol 80 mg, she felt a lot better. She denies any further tightness in her chest, shortness of breath, or wheezing. I auscultation her lungs were completely clear and wheeze free. She was discharged on Qvar 80, 2 puffs twice a day, a prednisone taper, and is to continue with her albuterol as needed. She was given albuterol MDI inhaler here. She should return if her breathing becomes worse in any way.  Reuben Likes, MD 10/08/12 2045

## 2012-10-08 NOTE — ED Notes (Signed)
Reports cold sx.  Started three days ago.  C/o productive and nausea.  Denies vomiting and diarrhea.  OTC medication tried but no relief.

## 2012-10-09 NOTE — ED Provider Notes (Signed)
Medical screening examination/treatment/procedure(s) were performed by non-physician practitioner and as supervising physician I was immediately available for consultation/collaboration.  Leslee Home, M.D.   Reuben Likes, MD 10/09/12 2120

## 2012-10-17 ENCOUNTER — Encounter (HOSPITAL_COMMUNITY): Payer: Self-pay | Admitting: *Deleted

## 2012-10-17 ENCOUNTER — Emergency Department (HOSPITAL_COMMUNITY)
Admission: EM | Admit: 2012-10-17 | Discharge: 2012-10-18 | Disposition: A | Discharge status: Home / Self Care | Payer: Medicare Other | Attending: Emergency Medicine | Admitting: Emergency Medicine

## 2012-10-17 DIAGNOSIS — E669 Obesity, unspecified: Secondary | ICD-10-CM | POA: Insufficient documentation

## 2012-10-17 DIAGNOSIS — J45909 Unspecified asthma, uncomplicated: Secondary | ICD-10-CM | POA: Diagnosis not present

## 2012-10-17 DIAGNOSIS — F17200 Nicotine dependence, unspecified, uncomplicated: Secondary | ICD-10-CM | POA: Diagnosis not present

## 2012-10-17 DIAGNOSIS — R202 Paresthesia of skin: Secondary | ICD-10-CM

## 2012-10-17 DIAGNOSIS — Z79899 Other long term (current) drug therapy: Secondary | ICD-10-CM | POA: Insufficient documentation

## 2012-10-17 DIAGNOSIS — R209 Unspecified disturbances of skin sensation: Secondary | ICD-10-CM | POA: Diagnosis not present

## 2012-10-17 DIAGNOSIS — Z8719 Personal history of other diseases of the digestive system: Secondary | ICD-10-CM | POA: Diagnosis not present

## 2012-10-17 DIAGNOSIS — R42 Dizziness and giddiness: Secondary | ICD-10-CM | POA: Diagnosis not present

## 2012-10-17 HISTORY — DX: Dizziness and giddiness: R42

## 2012-10-17 HISTORY — DX: Obesity, unspecified: E66.9

## 2012-10-17 HISTORY — DX: Gastro-esophageal reflux disease without esophagitis: K21.9

## 2012-10-17 NOTE — ED Notes (Addendum)
Here by EMS: C/o L eye twitching & L head numbness, onset 2 weeks ago. Today 1400 developed L leg numbness, then progressed to L arm, then L foot. "feels like pins and needles". Mentions cold sx ~ 3 weeks ago. (denies: HA, nvd, sob, pain, fever or cough). "just got dizzy walking in to triage area with EMS". Alert, NAD, calm, interactive, skin W&D, resps e/u, speaking in clear complete sentences.  Last PO intake 1800. Took peptobismol at 1830. PERRL78mm brisk, no droop or drift, speech clear, face symetrical, grip strength and coordination are equal and strong, normal gaze, no visual field disturbance noted in all quadrants bilateral eyes, CMS intact and equal in all 4 extremities. Cap refill equal <2 sec in BLE. LS CTA.

## 2012-10-18 ENCOUNTER — Emergency Department (HOSPITAL_COMMUNITY): Payer: Medicare Other

## 2012-10-18 DIAGNOSIS — R42 Dizziness and giddiness: Secondary | ICD-10-CM | POA: Diagnosis not present

## 2012-10-18 DIAGNOSIS — R209 Unspecified disturbances of skin sensation: Secondary | ICD-10-CM | POA: Diagnosis not present

## 2012-10-18 LAB — CBC WITH DIFFERENTIAL/PLATELET
Basophils Absolute: 0 10*3/uL (ref 0.0–0.1)
Basophils Relative: 0 % (ref 0–1)
Eosinophils Absolute: 0.2 10*3/uL (ref 0.0–0.7)
Eosinophils Relative: 2 % (ref 0–5)
Lymphocytes Relative: 40 % (ref 12–46)
MCHC: 33.2 g/dL (ref 30.0–36.0)
MCV: 86.9 fL (ref 78.0–100.0)
Platelets: 221 10*3/uL (ref 150–400)
RDW: 14.4 % (ref 11.5–15.5)
WBC: 11.5 10*3/uL — ABNORMAL HIGH (ref 4.0–10.5)

## 2012-10-18 LAB — BASIC METABOLIC PANEL
CO2: 24 mEq/L (ref 19–32)
Calcium: 9 mg/dL (ref 8.4–10.5)
Creatinine, Ser: 0.85 mg/dL (ref 0.50–1.10)
GFR calc Af Amer: >90 mL/min (ref >90)
GFR calc non Af Amer: >90 mL/min (ref >90)
Sodium: 131 mEq/L — ABNORMAL LOW (ref 135–145)

## 2012-10-18 LAB — PHOSPHORUS: Phosphorus: 4.4 mg/dL (ref 2.3–4.6)

## 2012-10-18 NOTE — Discharge Instructions (Signed)
Paresthesia  Paresthesia is an abnormal burning or prickling sensation. This sensation is generally felt in the hands, arms, legs, or feet. However, it may occur in any part of the body. It is usually not painful. The feeling may be described as:   Tingling or numbness.   "Pins and needles."   Skin crawling.   Buzzing.   Limbs "falling asleep."   Itching.  Most people experience temporary (transient) paresthesia at some time in their lives.  CAUSES   Paresthesia may occur when you breathe too quickly (hyperventilation). It can also occur without any apparent cause. Commonly, paresthesia occurs when pressure is placed on a nerve. The feeling quickly goes away once the pressure is removed. For some people, however, paresthesia is a long-lasting (chronic) condition caused by an underlying disorder. The underlying disorder may be:   A traumatic, direct injury to nerves. Examples include a:   Broken (fractured) neck.   Fractured skull.   A disorder affecting the brain and spinal cord (central nervous system). Examples include:   Transverse myelitis.   Encephalitis.   Transient ischemic attack.   Multiple sclerosis.   Stroke.   Tumor or blood vessel problems, such as an arteriovenous malformation pressing against the brain or spinal cord.   A condition that damages the peripheral nerves (peripheral neuropathy). Peripheral nerves are not part of the brain and spinal cord. These conditions include:   Diabetes.   Peripheral vascular disease.   Nerve entrapment syndromes, such as carpal tunnel syndrome.   Shingles.   Hypothyroidism.   Vitamin B12 deficiencies.   Alcoholism.   Heavy metal poisoning (lead, arsenic).   Rheumatoid arthritis.   Systemic lupus erythematosus.  DIAGNOSIS   Your caregiver will attempt to find the underlying cause of your paresthesia. Your caregiver may:   Take your medical history.   Perform a physical exam.   Order various lab tests.   Order imaging tests.  TREATMENT    Treatment for paresthesia depends on the underlying cause.  HOME CARE INSTRUCTIONS   Avoid drinking alcohol.   You may consider massage or acupuncture to help relieve your symptoms.   Keep all follow-up appointments as directed by your caregiver.  SEEK IMMEDIATE MEDICAL CARE IF:    You feel weak.   You have trouble walking or moving.   You have problems with speech or vision.   You feel confused.   You cannot control your bladder or bowel movements.   You feel numbness after an injury.   You faint.   Your burning or prickling feeling gets worse when walking.   You have pain, cramps, or dizziness.   You develop a rash.  MAKE SURE YOU:   Understand these instructions.   Will watch your condition.   Will get help right away if you are not doing well or get worse.  Document Released: 09/27/2002 Document Revised: 12/30/2011 Document Reviewed: 06/28/2011  [employer] Patient Information 2013 [employer].

## 2012-10-18 NOTE — ED Provider Notes (Signed)
History     CSN: [id number]  Arrival date & time 10/17/12  2201   First MD Initiated Contact with Patient 10/18/12 0048      Chief Complaint  Patient presents with  . Numbness  . Dizziness    (Consider location/radiation/quality/duration/timing/severity/associated sxs/prior treatment) HPIStephanie R Blackwell is a 31 y.o. female who presents with n=umbness on the left side of her body, specifically her left trapezius, left cervical paraspinous muscles, left arm and left leg excluding her left chest, back abdomen and buttock.  Started at 1400 and laster 10 hours, gone now.  She has never had this before, there was no associated weakness, difficulty walking, dysarthria, headache, nausea or vomiting, vertigo, dizziness, fever, or chills.  Smokes 1/2 PPD for the last 6 mo, and 1 PPD for 9 years, denies illegal drugs.  Pt is concerned because her father had a stroke amd died at 23 yo. Past Medical History  Diagnosis Date  . Asthma   . Vertigo   . Acid reflux   . Obesity     Past Surgical History  Procedure Date  . Cystectomy     No family history on file.  History  Substance Use Topics  . Smoking status: Current Every Day Smoker  . Smokeless tobacco: Not on file  . Alcohol Use: Yes    OB History    Grav Para Term Preterm Abortions TAB SAB Ect Mult Living                  Review of Systems At least 10pt or greater review of systems completed and are negative except where specified in the HPI.  Allergies  Amoxicillin  Home Medications   Current Outpatient Rx  Name  Route  Sig  Dispense  Refill  . ALBUTEROL SULFATE HFA 108 (90 BASE) MCG/ACT IN AERS   Inhalation   Inhale 2 puffs into the lungs every 4 (four) hours as needed. For shortness of breath         . BECLOMETHASONE DIPROPIONATE 80 MCG/ACT IN AERS   Inhalation   Inhale 2 puffs into the lungs 2 (two) times daily.   1 Inhaler   0   . CETIRIZINE HCL 10 MG PO TABS   Oral   Take 10 mg by mouth daily as  needed. For allergies         . MECLIZINE HCL 12.5 MG PO TABS   Oral   Take 12.5 mg by mouth every 8 (eight) hours as needed. For dizziness         . PREDNISONE 10 MG PO TABS   Oral   Take 20 mg by mouth daily.           BP 131/53  Pulse 93  Temp 98.3 F (36.8 C) (Oral)  Resp 18  SpO2 96%  LMP 10/01/2012  Physical Exam  PHYSICAL EXAM: VITAL SIGNS:  . Filed Vitals:   10/17/12 2211 10/17/12 2305  BP: 131/53   Pulse: 93   Temp: 98.6 F (37 C) 98.3 F (36.8 C)  TempSrc: Oral   Resp: 18   SpO2: 96%    CONSTITUTIONAL: Awake, oriented, appears non-toxic HENT: Atraumatic, normocephalic, oral mucosa pink and moist, airway patent. Nares patent without drainage. External ears normal. EYES: Conjunctiva clear, EOMI, PERRLA NECK: Trachea midline, non-tender, supple CARDIOVASCULAR: Normal heart rate, Normal rhythm, No murmurs, rubs, gallops PULMONARY/CHEST: Clear to auscultation, no rhonchi, wheezes, or rales. Symmetrical breath sounds. CHEST WALL: No lesions. Non-tender. ABDOMINAL: Non-distended, soft, non-tender -  no rebound or guarding.  BS normal. NEUROLOGIC: ZO:XWRUEA fields intact. PERRLA, EOMI.  Facial sensation equal to light touch bilaterally.  Good muscle bulk in the masseter muscle and good lateral movement of the jaw.  Facial expressions equal and good strength with smile/frown and puffed cheeks.  Hearing grossly intact to finger rub test.  Uvula, tongue are midline with no deviation. Symmetrical palate elevation.  Trapezius and SCM muscles are 5/5 strength bilaterally.   DTR: Brachioradialis, biceps, patellar, Achilles tendon reflexes 2+ bilaterally.  No clonus. Strength: 5/5 strength flexors and extensors in the upper and lower extremities.  Grip strength, finger adduction/abduction 5/5. Sensation: Sensation intact distally to light touch Cerebellar: No ataxia with walking or dysmetria with finger to nose, rapid alternating hand movements and heels to shin  testing. EXTREMITIES: No clubbing, cyanosis, or edema SKIN: Warm, Dry, No erythema, No rash   ED Course  Procedures (including critical care time)  Labs Reviewed  CBC WITH DIFFERENTIAL - Abnormal; Notable for the following:    WBC 11.5 (*)     Lymphs Abs 4.6 (*)     All other components within normal limits  BASIC METABOLIC PANEL - Abnormal; Notable for the following:    Sodium 131 (*)     All other components within normal limits  PHOSPHORUS  LAB REPORT - SCANNED   Ct Head Wo Contrast  10/18/2012  *RADIOLOGY REPORT*  Clinical Data: Left-sided numbness and dizziness.  CT HEAD WITHOUT CONTRAST  Technique:  Contiguous axial images were obtained from the base of the skull through the vertex without contrast.  Comparison: None.  Findings: There is no evidence of acute infarction, mass lesion, or intra- or extra-axial hemorrhage on CT.  The posterior fossa, including the cerebellum, brainstem and fourth ventricle, is within normal limits.  The third and lateral ventricles, and basal ganglia are unremarkable in appearance.  The cerebral hemispheres are symmetric in appearance, with normal gray- white differentiation.  No mass effect or midline shift is seen.  There is no evidence of fracture; visualized osseous structures are unremarkable in appearance.  The orbits are within normal limits. The paranasal sinuses and mastoid air cells are well-aerated.  No significant soft tissue abnormalities are seen.  IMPRESSION: Unremarkable noncontrast CT of the head.   Original Report Authenticated By: Tonia Ghent, M.D.      1. Paresthesia of left arm and leg       MDM  Stacy Blackwell is a 31 y.o. female presents with 10 hours of paresthesias to left arm and leg - nothing on the body, which have resolved and no other symptoms.  She is concerned for stroke - I doubt CVA with this presentation as it would take 2 separate TIA events happening exactly in sync to disrupt sensation in the sensory  cortex for this to occur.  MS is a possibility, but non-emergent.  CT negative of heard.  Other labs unremarkable.  Pt told to follow up with PCP for continued Sx.  I explained the diagnosis and have given explicit precautions to return to the ER for any new or worsening symptoms. The patient understands and accepts the medical plan as it's been dictated and I have answered their questions. Discharge instructions concerning home care and prescriptions have been given.  The patient is STABLE and is discharged to home in good condition.         Jones Skene, MD 10/19/12 2046

## 2012-10-30 DIAGNOSIS — J3081 Allergic rhinitis due to animal (cat) (dog) hair and dander: Secondary | ICD-10-CM | POA: Diagnosis not present

## 2012-10-30 DIAGNOSIS — J301 Allergic rhinitis due to pollen: Secondary | ICD-10-CM | POA: Diagnosis not present

## 2012-10-30 DIAGNOSIS — J3089 Other allergic rhinitis: Secondary | ICD-10-CM | POA: Diagnosis not present

## 2012-11-30 ENCOUNTER — Encounter (HOSPITAL_COMMUNITY): Payer: Self-pay

## 2012-11-30 ENCOUNTER — Emergency Department (INDEPENDENT_AMBULATORY_CARE_PROVIDER_SITE_OTHER)
Admission: EM | Admit: 2012-11-30 | Discharge: 2012-11-30 | Disposition: A | Discharge status: Home / Self Care | Payer: Medicare Other | Source: Home / Self Care | Attending: Emergency Medicine | Admitting: Emergency Medicine

## 2012-11-30 DIAGNOSIS — J301 Allergic rhinitis due to pollen: Secondary | ICD-10-CM | POA: Diagnosis not present

## 2012-11-30 DIAGNOSIS — R42 Dizziness and giddiness: Secondary | ICD-10-CM | POA: Diagnosis not present

## 2012-11-30 DIAGNOSIS — J3081 Allergic rhinitis due to animal (cat) (dog) hair and dander: Secondary | ICD-10-CM | POA: Diagnosis not present

## 2012-11-30 DIAGNOSIS — J45909 Unspecified asthma, uncomplicated: Secondary | ICD-10-CM

## 2012-11-30 DIAGNOSIS — J3089 Other allergic rhinitis: Secondary | ICD-10-CM | POA: Diagnosis not present

## 2012-11-30 LAB — POCT I-STAT, CHEM 8
HCT: 45 % (ref 36.0–46.0)
Hemoglobin: 15.3 g/dL — ABNORMAL HIGH (ref 12.0–15.0)
Potassium: 3.8 mEq/L (ref 3.5–5.1)
Sodium: 138 mEq/L (ref 135–145)

## 2012-11-30 MED ORDER — ALBUTEROL SULFATE (5 MG/ML) 0.5% IN NEBU
INHALATION_SOLUTION | RESPIRATORY_TRACT | Status: AC
  Filled 2012-11-30: qty 1

## 2012-11-30 MED ORDER — IPRATROPIUM BROMIDE 0.02 % IN SOLN
0.5000 mg | Freq: Once | RESPIRATORY_TRACT | Status: AC
  Administered 2012-11-30: 0.5 mg via RESPIRATORY_TRACT

## 2012-11-30 MED ORDER — ALBUTEROL SULFATE (5 MG/ML) 0.5% IN NEBU
5.0000 mg | INHALATION_SOLUTION | Freq: Once | RESPIRATORY_TRACT | Status: AC
  Administered 2012-11-30: 5 mg via RESPIRATORY_TRACT

## 2012-11-30 MED ORDER — LORATADINE 10 MG PO TABS: 10.0000 mg | ORAL_TABLET | Freq: Every day | ORAL | Status: DC

## 2012-11-30 NOTE — ED Provider Notes (Signed)
Medical screening examination/treatment/procedure(s) were performed by non-physician practitioner and as supervising physician I was immediately available for consultation/collaboration.  Leslee Home, M.D.  Reuben Likes, MD 11/30/12 2139

## 2012-11-30 NOTE — ED Notes (Signed)
C/o dizzy and weak x past week; NAD at present; LMP onset 2-8; denies pain

## 2012-11-30 NOTE — ED Provider Notes (Signed)
History     CSN: [id number]  Arrival date & time 11/30/12  1032   None     Chief Complaint  Patient presents with  . Weakness    (Consider location/radiation/quality/duration/timing/severity/associated sxs/prior treatment) HPI Comments: Pt reports dizziness for 6 months. Has seen pcp for this, given meclizine which didn't help. Has been to ER for this, had CT of head, no cause of dizziness identified. Dizziness is constant, nothing makes it better or worse. Also has been feeling anxious in the past week since quitting smoking.   Patient is a 32 y.o. female presenting with weakness. The history is provided by the patient.  Weakness This is a chronic problem. Episode onset: 6 months ago. The problem occurs constantly. The problem has not changed since onset.Associated symptoms include shortness of breath. Pertinent negatives include no chest pain and no headaches. Nothing aggravates the symptoms. Nothing relieves the symptoms. Treatments tried: meclizine. The treatment provided no relief.    Past Medical History  Diagnosis Date  . Asthma   . Vertigo   . Acid reflux   . Obesity     Past Surgical History  Procedure Laterality Date  . Cystectomy      History reviewed. No pertinent family history.  History  Substance Use Topics  . Smoking status: Current Every Day Smoker  . Smokeless tobacco: Not on file  . Alcohol Use: Yes    OB History   Grav Para Term Preterm Abortions TAB SAB Ect Mult Living                  Review of Systems  Constitutional: Negative for fever, chills and fatigue.  HENT: Negative for ear pain, congestion, rhinorrhea, neck pain, sinus pressure and tinnitus.   Respiratory: Positive for shortness of breath and wheezing.   Cardiovascular: Negative for chest pain.  Neurological: Positive for dizziness. Negative for weakness and headaches.  Psychiatric/Behavioral: The patient is nervous/anxious.     Allergies  Amoxicillin  Home Medications    Current Outpatient Rx  Name  Route  Sig  Dispense  Refill  . albuterol (PROVENTIL HFA;VENTOLIN HFA) 108 (90 BASE) MCG/ACT inhaler   Inhalation   Inhale 2 puffs into the lungs every 4 (four) hours as needed. For shortness of breath         . beclomethasone (QVAR) 80 MCG/ACT inhaler   Inhalation   Inhale 2 puffs into the lungs 2 (two) times daily.   1 Inhaler   0   . cetirizine (ZYRTEC) 10 MG tablet   Oral   Take 10 mg by mouth daily as needed. For allergies         . loratadine (CLARITIN) 10 MG tablet   Oral   Take 1 tablet (10 mg total) by mouth daily.   30 tablet   2   . meclizine (ANTIVERT) 12.5 MG tablet   Oral   Take 12.5 mg by mouth every 8 (eight) hours as needed. For dizziness         . predniSONE (DELTASONE) 10 MG tablet   Oral   Take 20 mg by mouth daily.           BP 117/80  Pulse 72  Temp(Src) 97.8 F (36.6 C) (Oral)  Resp 16  SpO2 100%  LMP 11/29/2012  Physical Exam  Constitutional: She is oriented to person, place, and time. She appears well-developed and well-nourished. No distress.  HENT:  Head: Normocephalic and atraumatic.  Right Ear: Tympanic membrane, external ear and  ear canal normal.  Left Ear: Tympanic membrane, external ear and ear canal normal.  Nose: Mucosal edema present. Right sinus exhibits no maxillary sinus tenderness and no frontal sinus tenderness. Left sinus exhibits no maxillary sinus tenderness and no frontal sinus tenderness.  Mouth/Throat: Oropharynx is clear and moist.  Cardiovascular: Normal rate and regular rhythm.   Not orthostatic  Pulmonary/Chest: Effort normal. No respiratory distress. She has wheezes in the right upper field, the right middle field, the right lower field, the left upper field and the left lower field.  Neurological: She is alert and oriented to person, place, and time. She has normal strength. No sensory deficit. Coordination and gait normal.    ED Course  Procedures (including critical  care time)  Labs Reviewed  POCT I-STAT, CHEM 8 - Abnormal; Notable for the following:    Hemoglobin 15.3 (*)    All other components within normal limits  POCT PREGNANCY, URINE   No results found.   1. Dizziness   2. Asthma       MDM  Pt lungs sounds clear after albuterol/atrovent nebs. Pt feels better. Not anemic, neuro intact; cannot identify source of dizziness.  Pt to f/u with pcp.         Cathlyn Parsons, NP 11/30/12 1423

## 2012-12-04 ENCOUNTER — Encounter (HOSPITAL_COMMUNITY): Payer: Self-pay | Admitting: Emergency Medicine

## 2012-12-04 ENCOUNTER — Emergency Department (HOSPITAL_COMMUNITY)
Admission: EM | Admit: 2012-12-04 | Discharge: 2012-12-04 | Disposition: A | Discharge status: Home / Self Care | Payer: Medicare Other | Attending: Emergency Medicine | Admitting: Emergency Medicine

## 2012-12-04 DIAGNOSIS — F17200 Nicotine dependence, unspecified, uncomplicated: Secondary | ICD-10-CM | POA: Diagnosis not present

## 2012-12-04 DIAGNOSIS — E669 Obesity, unspecified: Secondary | ICD-10-CM | POA: Diagnosis not present

## 2012-12-04 DIAGNOSIS — R05 Cough: Secondary | ICD-10-CM | POA: Insufficient documentation

## 2012-12-04 DIAGNOSIS — IMO0002 Reserved for concepts with insufficient information to code with codable children: Secondary | ICD-10-CM | POA: Insufficient documentation

## 2012-12-04 DIAGNOSIS — Z8719 Personal history of other diseases of the digestive system: Secondary | ICD-10-CM | POA: Diagnosis not present

## 2012-12-04 DIAGNOSIS — J45901 Unspecified asthma with (acute) exacerbation: Secondary | ICD-10-CM | POA: Insufficient documentation

## 2012-12-04 DIAGNOSIS — B9789 Other viral agents as the cause of diseases classified elsewhere: Secondary | ICD-10-CM | POA: Diagnosis not present

## 2012-12-04 DIAGNOSIS — R062 Wheezing: Secondary | ICD-10-CM

## 2012-12-04 DIAGNOSIS — Z8669 Personal history of other diseases of the nervous system and sense organs: Secondary | ICD-10-CM | POA: Diagnosis not present

## 2012-12-04 DIAGNOSIS — R093 Abnormal sputum: Secondary | ICD-10-CM | POA: Insufficient documentation

## 2012-12-04 DIAGNOSIS — B349 Viral infection, unspecified: Secondary | ICD-10-CM

## 2012-12-04 DIAGNOSIS — R6889 Other general symptoms and signs: Secondary | ICD-10-CM | POA: Insufficient documentation

## 2012-12-04 DIAGNOSIS — R059 Cough, unspecified: Secondary | ICD-10-CM | POA: Diagnosis not present

## 2012-12-04 DIAGNOSIS — Z79899 Other long term (current) drug therapy: Secondary | ICD-10-CM | POA: Diagnosis not present

## 2012-12-04 MED ORDER — IPRATROPIUM BROMIDE 0.02 % IN SOLN
0.5000 mg | Freq: Once | RESPIRATORY_TRACT | Status: AC
  Administered 2012-12-04: 0.5 mg via RESPIRATORY_TRACT
  Filled 2012-12-04: qty 2.5

## 2012-12-04 MED ORDER — ALBUTEROL SULFATE HFA 108 (90 BASE) MCG/ACT IN AERS
2.0000 | INHALATION_SPRAY | RESPIRATORY_TRACT | Status: DC | PRN
  Administered 2012-12-04: 2 via RESPIRATORY_TRACT
  Filled 2012-12-04: qty 6.7

## 2012-12-04 MED ORDER — MAGIC MOUTHWASH W/LIDOCAINE: 5.0000 mL | Freq: Four times a day (QID) | ORAL | Status: DC | PRN

## 2012-12-04 MED ORDER — IBUPROFEN 600 MG PO TABS: 600.0000 mg | ORAL_TABLET | Freq: Four times a day (QID) | ORAL | Status: DC | PRN

## 2012-12-04 MED ORDER — ALBUTEROL SULFATE (5 MG/ML) 0.5% IN NEBU
5.0000 mg | INHALATION_SOLUTION | Freq: Once | RESPIRATORY_TRACT | Status: AC
  Administered 2012-12-04: 5 mg via RESPIRATORY_TRACT
  Filled 2012-12-04: qty 1

## 2012-12-04 NOTE — ED Provider Notes (Signed)
History    This chart was scribed for non-physician practitioner working with Carleene Cooper III, MD by Frederik Pear, ED Scribe. This patient was seen in room TR08C/TR08C and the patient's care was started at 1932.     CSN: [id number]  Arrival date & time 12/04/12  1825   First MD Initiated Contact with Patient 12/04/12 1932      Chief Complaint  Patient presents with  . Sore Throat    (Consider location/radiation/quality/duration/timing/severity/associated sxs/prior treatment) The history is provided by the patient. No language interpreter was used.   Stacy Blackwell is a 32 y.o. female who presents to the Emergency Department complaining of a sudden onset, constant, gradually worsening, non-radiating sore throat with associated burning mouth pain that is aggravated by eating or drinking that began 3 days ago. She also complains of sneezing, a productive cough with yellow sputum, and wheezing. She denies any emesis, nausea, diarrhea, or fever. She has a h/o of asthma, but reports that she is out of her albuterol inhaler at home. She states that she has treated the symptoms with Dayquil and Advil Cold and Sinus with no relief.    Past Medical History  Diagnosis Date  . Asthma   . Vertigo   . Acid reflux   . Obesity     Past Surgical History  Procedure Laterality Date  . Cystectomy      No family history on file.  History  Substance Use Topics  . Smoking status: Current Every Day Smoker  . Smokeless tobacco: Not on file  . Alcohol Use: Yes    OB History   Grav Para Term Preterm Abortions TAB SAB Ect Mult Living                  Review of Systems  Constitutional: Negative for fever.  HENT: Positive for sore throat and sneezing.   Respiratory: Positive for cough and wheezing.   Gastrointestinal: Negative for nausea, vomiting and diarrhea.  All other systems reviewed and are negative.    Allergies  Amoxicillin  Home Medications   Current Outpatient Rx   Name  Route  Sig  Dispense  Refill  . albuterol (PROVENTIL HFA;VENTOLIN HFA) 108 (90 BASE) MCG/ACT inhaler   Inhalation   Inhale 2 puffs into the lungs every 4 (four) hours as needed. For shortness of breath         . beclomethasone (QVAR) 80 MCG/ACT inhaler   Inhalation   Inhale 2 puffs into the lungs 2 (two) times daily.   1 Inhaler   0   . cetirizine (ZYRTEC) 10 MG tablet   Oral   Take 10 mg by mouth daily as needed. For allergies         . loratadine (CLARITIN) 10 MG tablet   Oral   Take 1 tablet (10 mg total) by mouth daily.   30 tablet   2   . meclizine (ANTIVERT) 12.5 MG tablet   Oral   Take 12.5 mg by mouth every 8 (eight) hours as needed. For dizziness         . predniSONE (DELTASONE) 10 MG tablet   Oral   Take 20 mg by mouth daily.           BP 139/78  Pulse 78  Temp(Src) 98.5 F (36.9 C) (Oral)  Resp 18  SpO2 98%  LMP 11/29/2012  Physical Exam  Nursing note and vitals reviewed. Constitutional: She appears well-developed and well-nourished.  HENT:  Head: Normocephalic and  atraumatic.  Right Ear: Tympanic membrane normal.  Left Ear: Tympanic membrane normal.  Nose: Nose normal.  There is erythema present on the pharynx and vesicles on the roof of the mouth.    Eyes: Pupils are equal, round, and reactive to light.  Neck: Normal range of motion. Neck supple.  Pulmonary/Chest: Effort normal. No respiratory distress. She has wheezes in the right upper field, the right middle field, the right lower field, the left upper field, the left middle field and the left lower field.  There are mild expiratory wheezes in all fields.   Abdominal: Soft. There is no tenderness.  Musculoskeletal: Normal range of motion. She exhibits no tenderness.  Neurological: She is alert.  Skin: Skin is warm and dry. No rash noted.  There are no rashes presents on the hands or the feet.  Psychiatric: She has a normal mood and affect. Thought content normal.    ED  Course  Procedures (including critical care time)  DIAGNOSTIC STUDIES: Oxygen Saturation is 98% on room air, normal by my interpretation.    COORDINATION OF CARE:  20:02- Discussed planned course of treatment with the patient, who is agreeable at this time.  20:07- Medication Orders- albuterol (proventil HFA; ventolin hfa) 108 (90 base) mcg/act inhaler 2 puff every 4 hours.  20:15- Medication Orders- albuterol (proventil) (5 mg/ml) 0.5% nebulizer solution 5 mg- once, ipratropium (atrovent) nebulizer solution 0.5 mg- once.  21:57- Recheck- Her wheezing has resolved, and she is ready for discharge.   Results for orders placed during the hospital encounter of 12/04/12  RAPID STREP SCREEN      Result Value Range   Streptococcus, Group A Screen (Direct) NEGATIVE  NEGATIVE     Labs Reviewed  RAPID STREP SCREEN   No results found.   1. Viral infection   2. Wheezing     10:35 PM Patient seen and examined.   Vital signs reviewed and are as follows: Filed Vitals:   12/04/12 1907  BP: 139/78  Pulse: 78  Temp: 98.5 F (36.9 C)  Resp: 18      MDM  Patient with viral infection. Supported by vesicles mouth. She appears well, nontoxic. Wheezing resolved with breathing treatment in emergency department. Patient discharged home with albuterol inhaler.    I personally performed the services described in this documentation, which was scribed in my presence. The recorded information has been reviewed and is accurate.        Renne Crigler, [place] 12/04/12 2248

## 2012-12-04 NOTE — ED Notes (Signed)
C/o sore throat and burning in top of mouth when eating or drinking x 3 days.

## 2012-12-05 ENCOUNTER — Telehealth (HOSPITAL_COMMUNITY): Payer: Self-pay | Admitting: Emergency Medicine

## 2012-12-07 DIAGNOSIS — E669 Obesity, unspecified: Secondary | ICD-10-CM | POA: Diagnosis not present

## 2012-12-07 DIAGNOSIS — J209 Acute bronchitis, unspecified: Secondary | ICD-10-CM | POA: Diagnosis not present

## 2012-12-07 DIAGNOSIS — J45902 Unspecified asthma with status asthmaticus: Secondary | ICD-10-CM | POA: Diagnosis not present

## 2012-12-07 NOTE — ED Provider Notes (Signed)
Medical screening examination/treatment/procedure(s) were performed by non-physician practitioner and as supervising physician I was immediately available for consultation/collaboration.   Carleene Cooper III, MD 12/07/12 [id number]

## 2012-12-08 ENCOUNTER — Encounter (HOSPITAL_COMMUNITY): Payer: Self-pay | Admitting: Emergency Medicine

## 2012-12-08 ENCOUNTER — Emergency Department (HOSPITAL_COMMUNITY)
Admission: EM | Admit: 2012-12-08 | Discharge: 2012-12-08 | Disposition: A | Discharge status: Home / Self Care | Payer: Medicare Other | Attending: Emergency Medicine | Admitting: Emergency Medicine

## 2012-12-08 DIAGNOSIS — E669 Obesity, unspecified: Secondary | ICD-10-CM | POA: Diagnosis not present

## 2012-12-08 DIAGNOSIS — J029 Acute pharyngitis, unspecified: Secondary | ICD-10-CM | POA: Diagnosis not present

## 2012-12-08 DIAGNOSIS — F17200 Nicotine dependence, unspecified, uncomplicated: Secondary | ICD-10-CM | POA: Insufficient documentation

## 2012-12-08 DIAGNOSIS — Z8719 Personal history of other diseases of the digestive system: Secondary | ICD-10-CM | POA: Diagnosis not present

## 2012-12-08 DIAGNOSIS — J45909 Unspecified asthma, uncomplicated: Secondary | ICD-10-CM | POA: Insufficient documentation

## 2012-12-08 DIAGNOSIS — Z79899 Other long term (current) drug therapy: Secondary | ICD-10-CM | POA: Diagnosis not present

## 2012-12-08 NOTE — ED Provider Notes (Signed)
History    This chart was scribed for non-physician practitioner working with [name], * by Toya Smothers, ED Scribe. This patient was seen in room TR06C/TR06C and the patient's care was started at 23:37.  CSN: [id number]  Arrival date & time 12/08/12  2129   First MD Initiated Contact with Patient 12/08/12 2336      Chief Complaint  Patient presents with  . Sore Throat   Patient is a 32 y.o. female presenting with pharyngitis. The history is provided by the patient. No language interpreter was used.  Sore Throat This is a recurrent problem. The current episode started 12 to 24 hours ago. The problem occurs constantly. The problem has not changed since onset.Exacerbated by: laying in supine position. Relieved by: sitting up. She has tried nothing for the symptoms. The treatment provided no relief.    Stacy Blackwell is a 32 y.o. female who presents to the Emergency Department complaining of new, gradual onset, constant, moderate sore throat. Pain is described as swelling, is worse when laying in supine position, and alleviated by nothing. Typically healthy at baseline, CC represents a moderate deviation. Symptoms have not been treated PTA. No fever, chills, cough, congestion, rhinorrhea, chest pain, SOB, or n/v/d. Pt is an everyday smoker denying alcohol and illicit drug use.    Past Medical History  Diagnosis Date  . Asthma   . Vertigo   . Acid reflux   . Obesity     Past Surgical History  Procedure Laterality Date  . Cystectomy      No family history on file.  History  Substance Use Topics  . Smoking status: Current Every Day Smoker  . Smokeless tobacco: Not on file  . Alcohol Use: Yes    Review of Systems  HENT: Positive for sore throat.   All other systems reviewed and are negative.    Allergies  Amoxicillin  Home Medications   Current Outpatient Rx  Name  Route  Sig  Dispense  Refill  . albuterol (PROVENTIL HFA;VENTOLIN HFA) 108 (90 BASE)  MCG/ACT inhaler   Inhalation   Inhale 2 puffs into the lungs every 4 (four) hours as needed. For shortness of breath         . azithromycin (ZITHROMAX) 250 MG tablet   Oral   Take 250 mg by mouth daily.         Marland Kitchen ibuprofen (ADVIL,MOTRIN) 600 MG tablet   Oral   Take 1 tablet (600 mg total) by mouth every 6 (six) hours as needed for pain.   20 tablet   0   . predniSONE (DELTASONE) 10 MG tablet   Oral   Take 20 mg by mouth daily.         . Pseudoephedrine-APAP-DM (DAYQUIL PO)   Oral   Take 2 tablets by mouth 2 (two) times daily as needed (for cold symptoms).         . Pseudoephedrine-Ibuprofen (ADVIL COLD/SINUS PO)   Oral   Take 1 tablet by mouth 2 (two) times daily as needed (for cold symptoms).         . Alum & Mag Hydroxide-Simeth (MAGIC MOUTHWASH W/LIDOCAINE) SOLN   Oral   Take 5 mLs by mouth 4 (four) times daily as needed (mouth pain).   50 mL   0     BP 134/63  Pulse 75  Temp(Src) 97.5 F (36.4 C) (Oral)  Resp 16  SpO2 98%  LMP 11/29/2012  Physical Exam  Nursing note and  vitals reviewed. Constitutional: She is oriented to person, place, and time. She appears well-developed and well-nourished. No distress.  HENT:  Head: Normocephalic and atraumatic.  Eyes: EOM are normal.  Neck: Neck supple. No tracheal deviation present.  No lymph adenopathy. No erythema. No suspicion of airway compromise. Good air flow.  Cardiovascular: Normal rate.   Pulmonary/Chest: Effort normal. No respiratory distress.  Musculoskeletal: Normal range of motion.  Lymphadenopathy:    She has no cervical adenopathy.  Neurological: She is alert and oriented to person, place, and time.  Skin: Skin is warm and dry.  Psychiatric: She has a normal mood and affect. Her behavior is normal.    ED Course  Procedures DIAGNOSTIC STUDIES: Oxygen Saturation is 98% on room air, normal by my interpretation.    COORDINATION OF CARE: 23:37- Evaluated Pt. Pt is awake, alert, and without  distress. 23:43- Patient understand and agree with initial ED impression and plan with expectations set for ED visit.    Labs Reviewed - No data to display No results found.   No diagnosis found.  Pharyngitis.    MDM     I personally performed the services described in this documentation, which was scribed in my presence. The recorded information has been reviewed and is accurate.    Stacy Norman, NP 12/09/12 [phone]

## 2012-12-08 NOTE — ED Notes (Signed)
Pt given Azithromycin yesterday due to bronchitis. Pt states today she started feeling like her throat was bigger. Pt states she sleeps on several pillows at night to help her breath. Pt state she is on albuterol inhaler. Pt states she last took albuterol before coming to the ED.

## 2012-12-08 NOTE — ED Notes (Addendum)
Pt st's she was here on 2/14 ref. Sore throat, st's throat pain is gone but now st's throat feels swollen.  Pt was seen by her MD yesterday and started on antibiotic for bronchitis

## 2012-12-09 NOTE — ED Provider Notes (Signed)
Medical screening examination/treatment/procedure(s) were performed by non-physician practitioner and as supervising physician I was immediately available for consultation/collaboration.  Christopher J. Pollina, MD 12/09/12 1216 

## 2012-12-30 DIAGNOSIS — J3081 Allergic rhinitis due to animal (cat) (dog) hair and dander: Secondary | ICD-10-CM | POA: Diagnosis not present

## 2012-12-30 DIAGNOSIS — J301 Allergic rhinitis due to pollen: Secondary | ICD-10-CM | POA: Diagnosis not present

## 2013-01-29 DIAGNOSIS — J3089 Other allergic rhinitis: Secondary | ICD-10-CM | POA: Diagnosis not present

## 2013-01-29 DIAGNOSIS — J301 Allergic rhinitis due to pollen: Secondary | ICD-10-CM | POA: Diagnosis not present

## 2013-01-29 DIAGNOSIS — J3081 Allergic rhinitis due to animal (cat) (dog) hair and dander: Secondary | ICD-10-CM | POA: Diagnosis not present

## 2013-02-04 ENCOUNTER — Encounter (HOSPITAL_COMMUNITY): Payer: Self-pay | Admitting: Emergency Medicine

## 2013-02-04 ENCOUNTER — Emergency Department (INDEPENDENT_AMBULATORY_CARE_PROVIDER_SITE_OTHER)
Admission: EM | Admit: 2013-02-04 | Discharge: 2013-02-04 | Disposition: A | Discharge status: Home / Self Care | Payer: Medicare Other | Source: Home / Self Care | Attending: Family Medicine | Admitting: Family Medicine

## 2013-02-04 DIAGNOSIS — Z9109 Other allergy status, other than to drugs and biological substances: Secondary | ICD-10-CM

## 2013-02-04 DIAGNOSIS — J45901 Unspecified asthma with (acute) exacerbation: Secondary | ICD-10-CM | POA: Diagnosis not present

## 2013-02-04 MED ORDER — BECLOMETHASONE DIPROPIONATE 80 MCG/ACT IN AERS: 1.0000 | INHALATION_SPRAY | RESPIRATORY_TRACT | Status: DC | PRN

## 2013-02-04 MED ORDER — ALBUTEROL SULFATE (5 MG/ML) 0.5% IN NEBU
INHALATION_SOLUTION | RESPIRATORY_TRACT | Status: AC
  Filled 2013-02-04: qty 1

## 2013-02-04 MED ORDER — TRIAMCINOLONE ACETONIDE 40 MG/ML IJ SUSP
INTRAMUSCULAR | Status: AC
  Filled 2013-02-04: qty 5

## 2013-02-04 MED ORDER — METHYLPREDNISOLONE 4 MG PO KIT: PACK | ORAL | Status: DC

## 2013-02-04 MED ORDER — IPRATROPIUM BROMIDE 0.02 % IN SOLN
0.5000 mg | Freq: Once | RESPIRATORY_TRACT | Status: AC
  Administered 2013-02-04: 0.5 mg via RESPIRATORY_TRACT

## 2013-02-04 MED ORDER — TRIAMCINOLONE ACETONIDE 40 MG/ML IJ SUSP
40.0000 mg | Freq: Once | INTRAMUSCULAR | Status: AC
  Administered 2013-02-04: 40 mg via INTRAMUSCULAR

## 2013-02-04 MED ORDER — ALBUTEROL SULFATE (5 MG/ML) 0.5% IN NEBU
5.0000 mg | INHALATION_SOLUTION | Freq: Once | RESPIRATORY_TRACT | Status: AC
  Administered 2013-02-04: 5 mg via RESPIRATORY_TRACT

## 2013-02-04 MED ORDER — ALBUTEROL SULFATE (5 MG/ML) 0.5% IN NEBU: 5.0000 mg | INHALATION_SOLUTION | RESPIRATORY_TRACT | Status: DC | PRN

## 2013-02-04 NOTE — ED Notes (Signed)
Pt in for cold sx onset 2 days Sx include: productive cough, sneezing, runny nose, nasal congestion, wheezing, SOB Denies: f/v/n/d Taking albuterol w/no relief Daughter also being seen for similar sx  She is alert and oriented w/no signs of acute distress.

## 2013-02-04 NOTE — ED Provider Notes (Signed)
History     CSN: [id number]  Arrival date & time 02/04/13  1304   First MD Initiated Contact with Patient 02/04/13 1439      Chief Complaint  Patient presents with  . URI    (Consider location/radiation/quality/duration/timing/severity/associated sxs/prior treatment) HPI Comments: 32 year old morbidly obese female with history of asthma presents with wheezing, chest congestion, shortness of breath and cough. The exacerbation began approximately 2 days ago. She is using up earlier without relief. She also takes prednisone 10 mg daily. Denies fever, chills, GI or GU symptoms.  Patient is a 32 y.o. female presenting with URI.  URI Presenting symptoms: cough and rhinorrhea   Presenting symptoms: no congestion, no fatigue, no fever and no sore throat   Associated symptoms: wheezing   Associated symptoms: no neck pain     Past Medical History  Diagnosis Date  . Asthma   . Vertigo   . Acid reflux   . Obesity     Past Surgical History  Procedure Laterality Date  . Cystectomy      No family history on file.  History  Substance Use Topics  . Smoking status: Current Every Day Smoker  . Smokeless tobacco: Not on file  . Alcohol Use: Yes    OB History   Grav Para Term Preterm Abortions TAB SAB Ect Mult Living                  Review of Systems  Constitutional: Positive for activity change. Negative for fever, chills, appetite change and fatigue.  HENT: Positive for rhinorrhea. Negative for congestion, sore throat, facial swelling, neck pain, neck stiffness and postnasal drip.   Eyes: Negative.   Respiratory: Positive for cough, chest tightness, shortness of breath and wheezing. Negative for stridor.   Cardiovascular: Negative.   Gastrointestinal: Negative.   Genitourinary: Negative.   Skin: Negative for pallor and rash.  Neurological: Negative.     Allergies  Amoxicillin  Home Medications   Current Outpatient Rx  Name  Route  Sig  Dispense  Refill  .  albuterol (PROVENTIL HFA;VENTOLIN HFA) 108 (90 BASE) MCG/ACT inhaler   Inhalation   Inhale 2 puffs into the lungs every 4 (four) hours as needed. For shortness of breath         . Alum & Mag Hydroxide-Simeth (MAGIC MOUTHWASH W/LIDOCAINE) SOLN   Oral   Take 5 mLs by mouth 4 (four) times daily as needed (mouth pain).   50 mL   0   . azithromycin (ZITHROMAX) 250 MG tablet   Oral   Take 250 mg by mouth daily.         . beclomethasone (QVAR) 80 MCG/ACT inhaler   Inhalation   Inhale 1 puff into the lungs as needed. Inhale one puff into lungs BID   1 Inhaler   12   . ibuprofen (ADVIL,MOTRIN) 600 MG tablet   Oral   Take 1 tablet (600 mg total) by mouth every 6 (six) hours as needed for pain.   20 tablet   0   . methylPREDNISolone (MEDROL DOSEPAK) 4 MG tablet      follow package directions   21 tablet   0   . predniSONE (DELTASONE) 10 MG tablet   Oral   Take 20 mg by mouth daily.         . Pseudoephedrine-APAP-DM (DAYQUIL PO)   Oral   Take 2 tablets by mouth 2 (two) times daily as needed (for cold symptoms).         Marland Kitchen  Pseudoephedrine-Ibuprofen (ADVIL COLD/SINUS PO)   Oral   Take 1 tablet by mouth 2 (two) times daily as needed (for cold symptoms).           BP 115/46  Pulse 98  Temp(Src) 98.1 F (36.7 C) (Oral)  Resp 18  SpO2 100%  LMP 02/01/2013  Physical Exam  Nursing note and vitals reviewed. Constitutional: She is oriented to person, place, and time. She appears well-developed and well-nourished.  Mild distress with wheezing.  HENT:  Right Ear: External ear normal.  Left Ear: External ear normal.  Mouth/Throat: Oropharynx is clear and moist. No oropharyngeal exudate.  Eyes: Conjunctivae and EOM are normal.  Neck: Normal range of motion. Neck supple.  Cardiovascular: Normal rate, regular rhythm and normal heart sounds.   Pulmonary/Chest: She has wheezes. She has no rales.  Increased effort with respirations. Both inspiratory and expiratory phases  or prolonged period positive for inspiratory and expiratory wheezes.  Musculoskeletal: Normal range of motion. She exhibits no edema.  Lymphadenopathy:    She has no cervical adenopathy.  Neurological: She is alert and oriented to person, place, and time.  Skin: Skin is warm and dry. No rash noted.  Psychiatric: She has a normal mood and affect.    ED Course  Procedures (including critical care time)  Labs Reviewed - No data to display No results found.   1. Asthma exacerbation   2. Environmental allergies       MDM  AndReassesed. Pt st breathing much better post Duoneb. I can hear scattered wheezing so will repeat an Albuterol neb only 20 min after the first one ended. Assement post 2nd Albuterol neb: no wheezing and much improved air movement.  Kenalog 40mg  IM Medrol dosepack for 7 days.  Qvar 80 one inhalation bid.  COnt the Claritin daily. RF on Albuterol sol'n for neb. F/u with your doctor next week.        Hayden Rasmussen, NP 02/04/13 1610  Hayden Rasmussen, NP 02/04/13 [phone]

## 2013-02-05 ENCOUNTER — Emergency Department (HOSPITAL_COMMUNITY): Payer: Medicare Other

## 2013-02-05 ENCOUNTER — Emergency Department (HOSPITAL_COMMUNITY)
Admission: EM | Admit: 2013-02-05 | Discharge: 2013-02-05 | Disposition: A | Discharge status: Home / Self Care | Payer: Medicare Other | Attending: Emergency Medicine | Admitting: Emergency Medicine

## 2013-02-05 DIAGNOSIS — Z79899 Other long term (current) drug therapy: Secondary | ICD-10-CM | POA: Insufficient documentation

## 2013-02-05 DIAGNOSIS — Z8719 Personal history of other diseases of the digestive system: Secondary | ICD-10-CM | POA: Insufficient documentation

## 2013-02-05 DIAGNOSIS — E669 Obesity, unspecified: Secondary | ICD-10-CM | POA: Insufficient documentation

## 2013-02-05 DIAGNOSIS — R062 Wheezing: Secondary | ICD-10-CM | POA: Diagnosis not present

## 2013-02-05 DIAGNOSIS — J159 Unspecified bacterial pneumonia: Secondary | ICD-10-CM | POA: Diagnosis not present

## 2013-02-05 DIAGNOSIS — J45901 Unspecified asthma with (acute) exacerbation: Secondary | ICD-10-CM | POA: Diagnosis not present

## 2013-02-05 DIAGNOSIS — F17200 Nicotine dependence, unspecified, uncomplicated: Secondary | ICD-10-CM | POA: Diagnosis not present

## 2013-02-05 DIAGNOSIS — R059 Cough, unspecified: Secondary | ICD-10-CM | POA: Diagnosis not present

## 2013-02-05 DIAGNOSIS — R05 Cough: Secondary | ICD-10-CM | POA: Insufficient documentation

## 2013-02-05 DIAGNOSIS — J189 Pneumonia, unspecified organism: Secondary | ICD-10-CM

## 2013-02-05 DIAGNOSIS — R0789 Other chest pain: Secondary | ICD-10-CM | POA: Diagnosis not present

## 2013-02-05 LAB — BASIC METABOLIC PANEL
GFR calc Af Amer: >90 mL/min (ref >90)
GFR calc non Af Amer: 89 mL/min — ABNORMAL LOW (ref >90)
Potassium: 3.8 mEq/L (ref 3.5–5.1)
Sodium: 137 mEq/L (ref 135–145)

## 2013-02-05 LAB — POCT I-STAT TROPONIN I: Troponin i, poc: 0 ng/mL (ref 0.00–0.08)

## 2013-02-05 LAB — CBC
Hemoglobin: 13.1 g/dL (ref 12.0–15.0)
RBC: 4.56 MIL/uL (ref 3.87–5.11)

## 2013-02-05 MED ORDER — AZITHROMYCIN 250 MG PO TABS: 250.0000 mg | ORAL_TABLET | Freq: Every day | ORAL | Status: DC

## 2013-02-05 MED ORDER — AZITHROMYCIN 250 MG PO TABS
500.0000 mg | ORAL_TABLET | Freq: Once | ORAL | Status: AC
  Administered 2013-02-05: 500 mg via ORAL
  Filled 2013-02-05 (×2): qty 2

## 2013-02-05 MED ORDER — ALBUTEROL SULFATE (5 MG/ML) 0.5% IN NEBU
5.0000 mg | INHALATION_SOLUTION | Freq: Once | RESPIRATORY_TRACT | Status: AC
  Administered 2013-02-05: 5 mg via RESPIRATORY_TRACT
  Filled 2013-02-05: qty 1

## 2013-02-05 NOTE — ED Notes (Signed)
Ambulated with Patient 02 stat did not go below 96%.  Patient did well with ambulation.  No use of oxygen.

## 2013-02-05 NOTE — ED Provider Notes (Signed)
History     CSN: [id number]  Arrival date & time 02/05/13  0605   First MD Initiated Contact with Patient 02/05/13 [phone]      Chief Complaint  Patient presents with  . Shortness of Breath    (Consider location/radiation/quality/duration/timing/severity/associated sxs/prior treatment) HPI  32 year old female with history of asthma and obesity presents for complaints of shortness of breath. Patient reports for the past 2 days she has had persistent shortness of breath, sneezing, and nonproductive cough, and chest pressure sensation. The sensation of chest tightness has been ongoing for the past 12 hours, persistent, nonexertional. Symptoms worsen when she coughs. Otherwise patient denies fever, chills, headache, sore throat, nausea, vomiting, diarrhea, abdominal pain, or rash. She denies hemoptysis. She is a former smoker but has quit for the past one month. No birth control use, no other risk factors for PE or DVT. She was seen at the urgent care for complaints yesterday, was treated for asthma with breathing treatments, steroids, and was given prescription for 7 day steroid, and allergy medication. She has not fill the medication quite yet. She does have a family history of congestive heart failure but no history of premature cardiac death. She denies any exertional chest pain.  No hx of HTN or diabetes.  Past Medical History  Diagnosis Date  . Asthma   . Vertigo   . Acid reflux   . Obesity     Past Surgical History  Procedure Laterality Date  . Cystectomy      No family history on file.  History  Substance Use Topics  . Smoking status: Current Every Day Smoker  . Smokeless tobacco: Not on file  . Alcohol Use: Yes    OB History   Grav Para Term Preterm Abortions TAB SAB Ect Mult Living                  Review of Systems  Constitutional:       10 Systems reviewed and all are negative for acute change except as noted in the HPI.     Allergies  Amoxicillin  Home  Medications   Current Outpatient Rx  Name  Route  Sig  Dispense  Refill  . albuterol (PROVENTIL HFA;VENTOLIN HFA) 108 (90 BASE) MCG/ACT inhaler   Inhalation   Inhale 2 puffs into the lungs every 6 (six) hours as needed for wheezing or shortness of breath.         Marland Kitchen albuterol (PROVENTIL) (5 MG/ML) 0.5% nebulizer solution   Nebulization   Take 2.5 mg by nebulization every 6 (six) hours as needed for wheezing or shortness of breath.         . beclomethasone (QVAR) 80 MCG/ACT inhaler   Inhalation   Inhale 1 puff into the lungs 2 (two) times daily.           BP 109/58  Pulse 103  Temp(Src) 99 F (37.2 C) (Oral)  Resp 16  SpO2 99%  LMP 02/01/2013  Physical Exam  Nursing note and vitals reviewed. Constitutional: She is oriented to person, place, and time. She appears well-developed and well-nourished. No distress.  Awake, alert, nontoxic appearance.  Morbidly obese.  HENT:  Head: Atraumatic.  Right Ear: External ear normal.  Left Ear: External ear normal.  Mouth/Throat: Oropharynx is clear and moist.  Eyes: Conjunctivae are normal. Right eye exhibits no discharge. Left eye exhibits no discharge.  Neck: Neck supple. No JVD present.  Cardiovascular: Normal rate, regular rhythm and intact distal pulses.  Exam  reveals no gallop and no friction rub.   No murmur heard. Pulmonary/Chest: Effort normal. No respiratory distress. She has wheezes (Scar wheezes and rhonchi heard). She exhibits no tenderness.  Able to speak in complete sentences, in no apparent respiratory distress  Abdominal: Soft. There is no tenderness. There is no rebound.  Musculoskeletal: She exhibits no edema and no tenderness.  ROM appears intact, no obvious focal weakness  Neurological: She is alert and oriented to person, place, and time.  Mental status and motor strength appears intact  Skin: No rash noted.  Psychiatric: She has a normal mood and affect.    ED Course  Procedures (including critical care  time)  6:47 AM Patient with history of asthma presents with asthma related symptoms. She appears to be in no acute respiratory distress. Lung exam is remarkable for scattered wheezes and rhonchi. Breathing treatment given. Since patient endorse per chest pain, chest x-ray. The chest pain is nonexertional in nature and considering her age i have low suspicion for cardiac disease. We'll continue with breathing treatments at this time.  8:04 AM Chest x-ray shows evidence of a left midlung airspace opacity likely reflect mild pneumonia. We'll treat with Zithromax. Patient does have medication to treat her shortness of breath at home. Return precautions discussed.  Pt able to ambulate while maintaining normal O2.    Labs Reviewed  BASIC METABOLIC PANEL - Abnormal; Notable for the following:    GFR calc non Af Amer 89 (*)    All other components within normal limits  CBC  POCT I-STAT TROPONIN I   Dg Chest 2 View  02/05/2013  *RADIOLOGY REPORT*  Clinical Data: Cough and chest tightness; sternal chest pain.  CHEST - 2 VIEW  Comparison: Chest radiograph performed 10/22/2011  Findings: The lungs are well-aerated.  Left midlung airspace opacity likely reflects mild pneumonia.  Pulmonary vascularity is at the upper limits of normal.  There is no evidence of pleural effusion or pneumothorax.  The heart is normal in size; the mediastinal contour is within normal limits.  No acute osseous abnormalities are seen.  IMPRESSION: Left midlung airspace opacity likely reflects mild pneumonia.   Original Report Authenticated By: Tonia Ghent, M.D.      1. Asthma exacerbation   2. CAP (community acquired pneumonia)       MDM  BP 99/50  Pulse 93  Temp(Src) 99 F (37.2 C) (Oral)  Resp 18  SpO2 99%  LMP 01/21/2013  I have reviewed nursing notes and vital signs. I personally reviewed the imaging tests through PACS system  I reviewed available ER/hospitalization records thought the  EMR         Fayrene Helper, [place] 02/05/13 1610

## 2013-02-05 NOTE — ED Notes (Signed)
NAD noted at time of d/c home 

## 2013-02-05 NOTE — ED Notes (Signed)
Sob and chest wall x 2 days. Went to [facility] last night and given a shot of steroids and 2 breathing treatments. Prescribed inhalers and steroid, in which she would get filled this morning. Non-productive cough.

## 2013-02-05 NOTE — ED Provider Notes (Signed)
Medical screening examination/treatment/procedure(s) were performed by non-physician practitioner and as supervising physician I was immediately available for consultation/collaboration.   Hanley Seamen, MD 02/05/13 2250

## 2013-02-08 ENCOUNTER — Encounter (HOSPITAL_COMMUNITY): Payer: Self-pay | Admitting: Emergency Medicine

## 2013-02-08 ENCOUNTER — Emergency Department (HOSPITAL_COMMUNITY)
Admission: EM | Admit: 2013-02-08 | Discharge: 2013-02-08 | Discharge status: Against Medical Advice | Payer: Medicare Other | Attending: Emergency Medicine | Admitting: Emergency Medicine

## 2013-02-08 DIAGNOSIS — R0602 Shortness of breath: Secondary | ICD-10-CM | POA: Diagnosis not present

## 2013-02-08 DIAGNOSIS — R0989 Other specified symptoms and signs involving the circulatory and respiratory systems: Secondary | ICD-10-CM | POA: Insufficient documentation

## 2013-02-08 DIAGNOSIS — R05 Cough: Secondary | ICD-10-CM | POA: Insufficient documentation

## 2013-02-08 DIAGNOSIS — R059 Cough, unspecified: Secondary | ICD-10-CM | POA: Diagnosis not present

## 2013-02-08 NOTE — ED Notes (Addendum)
PT. REPORTS PERSISTENT DRY COUGH WITH CHEST CONGESTION FOR SEVERAL DAYS , SEEN HERE LAST Friday DIAGNOSED WITH PNEUMONIA PRESCRIBED WITH ZITHROMAX ANTIBIOTIC 4TH DAY TODAY WITH IMPROVEMENT , SLIGHT SOB .

## 2013-02-08 NOTE — ED Provider Notes (Signed)
Medical screening examination/treatment/procedure(s) were performed by resident physician or non-physician practitioner and as supervising physician I was immediately available for consultation/collaboration.   Daniele Yankowski DOUGLAS MD.   Aalaya Yadao D Naftuli Dalsanto, MD 02/08/13 2043 

## 2013-02-08 NOTE — ED Notes (Signed)
Patient called twice to reassess vitals and no answer.

## 2013-02-09 DIAGNOSIS — F17200 Nicotine dependence, unspecified, uncomplicated: Secondary | ICD-10-CM | POA: Diagnosis not present

## 2013-02-09 DIAGNOSIS — J189 Pneumonia, unspecified organism: Secondary | ICD-10-CM | POA: Diagnosis not present

## 2013-02-09 DIAGNOSIS — Z6841 Body Mass Index (BMI) 40.0 and over, adult: Secondary | ICD-10-CM | POA: Diagnosis not present

## 2013-02-09 DIAGNOSIS — J45902 Unspecified asthma with status asthmaticus: Secondary | ICD-10-CM | POA: Diagnosis not present

## 2013-05-03 ENCOUNTER — Encounter (HOSPITAL_COMMUNITY): Payer: Self-pay | Admitting: *Deleted

## 2013-05-03 ENCOUNTER — Inpatient Hospital Stay (HOSPITAL_COMMUNITY)
Admission: AD | Admit: 2013-05-03 | Discharge: 2013-05-03 | Disposition: A | Discharge status: Home / Self Care | Payer: Medicare Other | Source: Ambulatory Visit | Attending: Obstetrics & Gynecology | Admitting: Obstetrics & Gynecology

## 2013-05-03 DIAGNOSIS — N739 Female pelvic inflammatory disease, unspecified: Secondary | ICD-10-CM | POA: Diagnosis not present

## 2013-05-03 DIAGNOSIS — N949 Unspecified condition associated with female genital organs and menstrual cycle: Secondary | ICD-10-CM | POA: Insufficient documentation

## 2013-05-03 DIAGNOSIS — Z880 Allergy status to penicillin: Secondary | ICD-10-CM | POA: Insufficient documentation

## 2013-05-03 DIAGNOSIS — N730 Acute parametritis and pelvic cellulitis: Secondary | ICD-10-CM

## 2013-05-03 HISTORY — DX: Major depressive disorder, single episode, unspecified: F32.9

## 2013-05-03 HISTORY — DX: Depression, unspecified: F32.A

## 2013-05-03 HISTORY — DX: Unspecified ovarian cyst, unspecified side: N83.209

## 2013-05-03 LAB — URINALYSIS, ROUTINE W REFLEX MICROSCOPIC
Glucose, UA: NEGATIVE mg/dL
Hgb urine dipstick: NEGATIVE
Ketones, ur: NEGATIVE mg/dL
Leukocytes, UA: NEGATIVE
Protein, ur: NEGATIVE mg/dL
Urobilinogen, UA: 0.2 mg/dL (ref 0.0–1.0)

## 2013-05-03 LAB — WET PREP, GENITAL

## 2013-05-03 MED ORDER — DOXYCYCLINE HYCLATE 100 MG PO CAPS: 100.0000 mg | ORAL_CAPSULE | Freq: Two times a day (BID) | ORAL | Status: DC

## 2013-05-03 MED ORDER — AZITHROMYCIN 250 MG PO TABS: 1000.0000 mg | ORAL_TABLET | ORAL | Status: DC

## 2013-05-03 MED ORDER — AZITHROMYCIN 250 MG PO TABS
2000.0000 mg | ORAL_TABLET | ORAL | Status: AC
  Administered 2013-05-03: 2000 mg via ORAL
  Filled 2013-05-03: qty 8

## 2013-05-03 MED ORDER — FLUCONAZOLE 150 MG PO TABS: 150.0000 mg | ORAL_TABLET | Freq: Once | ORAL | Status: DC

## 2013-05-03 MED ORDER — METRONIDAZOLE 500 MG PO TABS: 500.0000 mg | ORAL_TABLET | Freq: Two times a day (BID) | ORAL | Status: DC

## 2013-05-03 NOTE — MAU Note (Signed)
Pain in lower abd, neg home test last wk.

## 2013-05-03 NOTE — MAU Note (Signed)
Patient states she has not had a period since 5-22. Has had a negative home pregnancy test. Has had lower abdominal cramping for 2 weeks. Denies bleeding or discharge. Has a little nausea, no vomiting.

## 2013-05-03 NOTE — MAU Provider Note (Signed)
History     CSN: [id number]  Arrival date and time: 05/03/13 1317   First Provider Initiated Contact with Patient 05/03/13 1606      Chief Complaint  Patient presents with  . Possible Pregnancy  . Abdominal Pain   HPI  Pt is here with report of lower pelvic pain x two weeks.  Pain is described as a shooting, pulling pain.  No report of abnormal vaginal discharge or bleeding.  Last menstrual period was 03/11/13.  History of regular cycles until this episode.    Past Medical History  Diagnosis Date  . Asthma   . Vertigo   . Acid reflux   . Obesity   . Depression     doing good  . Ovarian cyst     Past Surgical History  Procedure Laterality Date  . Cystectomy      Family History  Problem Relation Age of Onset  . Asthma Mother   . Diabetes Mother   . Heart disease Mother     CHF  . Asthma Sister   . Diabetes Sister   . Heart disease Sister   . Hearing loss Neg Hx     History  Substance Use Topics  . Smoking status: Current Every Day Smoker -- 0.25 packs/day for 10 years    Types: Cigarettes  . Smokeless tobacco: Never Used  . Alcohol Use: No    Allergies:  Allergies  Allergen Reactions  . Amoxicillin Hives and Swelling    Swelling in the eyes    Prescriptions prior to admission  Medication Sig Dispense Refill  . albuterol (PROVENTIL HFA;VENTOLIN HFA) 108 (90 BASE) MCG/ACT inhaler Inhale 2 puffs into the lungs every 6 (six) hours as needed for wheezing or shortness of breath.      Marland Kitchen albuterol (PROVENTIL) (5 MG/ML) 0.5% nebulizer solution Take 2.5 mg by nebulization every 6 (six) hours as needed for wheezing or shortness of breath.      . calcium carbonate (OS-CAL) 1250 MG chewable tablet Chew 1 tablet by mouth daily.      . polyethylene glycol (MIRALAX / GLYCOLAX) packet Take 17 g by mouth daily.      . [DISCONTINUED] azithromycin (ZITHROMAX) 250 MG tablet Take 1 tablet (250 mg total) by mouth daily.  4 tablet  0  . [DISCONTINUED] beclomethasone (QVAR)  80 MCG/ACT inhaler Inhale 1 puff into the lungs 2 (two) times daily.        Review of Systems  Constitutional: Negative for fever and chills.  Gastrointestinal: Positive for heartburn, nausea and abdominal pain (lower pelvic). Negative for vomiting, diarrhea and constipation.  Genitourinary: Negative.    Physical Exam   Blood pressure 108/72, pulse 73, temperature 98.3 F (36.8 C), temperature source Oral, resp. rate 18, height 5' 2.5" (1.588 m), weight 127.007 kg (280 lb), last menstrual period 03/11/2013, SpO2 100.00%.  Physical Exam  Constitutional: She is oriented to person, place, and time. She appears well-developed and well-nourished. No distress.  HENT:  Head: Normocephalic.  Neck: Normal range of motion. Neck supple.  Cardiovascular: Normal rate, regular rhythm and normal heart sounds.   Respiratory: Effort normal and breath sounds normal. No respiratory distress.  GI: Soft. Bowel sounds are normal. She exhibits no mass. There is tenderness (lower pelvic). There is no guarding.  Genitourinary: Cervix exhibits motion tenderness. Vaginal discharge (white, creamy, frothy) found.  Musculoskeletal: Normal range of motion. She exhibits no edema.  Neurological: She is alert and oriented to person, place, and time. She has  normal reflexes.  Skin: Skin is warm and dry.    MAU Course  Procedures 1630  Zithromax 2 GM PO (Allergic to Amoxicillin)  Assessment and Plan  PID  Plan: RX Doxycycline 100mg  BID x 14 days RX Flagyl 500 mg BID x 14 days RX Diflucan 150 mg with refill x 1 Follow-up if no improvement in symptoms.   [facility] 05/03/2013, 4:08 PM

## 2013-05-04 LAB — GC/CHLAMYDIA PROBE AMP: GC Probe RNA: NEGATIVE

## 2013-05-25 ENCOUNTER — Emergency Department (HOSPITAL_COMMUNITY): Payer: Medicare Other

## 2013-05-25 ENCOUNTER — Encounter (HOSPITAL_COMMUNITY): Payer: Self-pay | Admitting: *Deleted

## 2013-05-25 DIAGNOSIS — R079 Chest pain, unspecified: Secondary | ICD-10-CM | POA: Diagnosis not present

## 2013-05-25 DIAGNOSIS — R071 Chest pain on breathing: Secondary | ICD-10-CM | POA: Diagnosis not present

## 2013-05-25 DIAGNOSIS — R059 Cough, unspecified: Secondary | ICD-10-CM | POA: Diagnosis not present

## 2013-05-25 DIAGNOSIS — R0602 Shortness of breath: Secondary | ICD-10-CM | POA: Diagnosis not present

## 2013-05-25 DIAGNOSIS — J45909 Unspecified asthma, uncomplicated: Secondary | ICD-10-CM | POA: Diagnosis not present

## 2013-05-25 DIAGNOSIS — Z8719 Personal history of other diseases of the digestive system: Secondary | ICD-10-CM | POA: Insufficient documentation

## 2013-05-25 DIAGNOSIS — Z8742 Personal history of other diseases of the female genital tract: Secondary | ICD-10-CM | POA: Diagnosis not present

## 2013-05-25 DIAGNOSIS — E669 Obesity, unspecified: Secondary | ICD-10-CM | POA: Insufficient documentation

## 2013-05-25 DIAGNOSIS — J45901 Unspecified asthma with (acute) exacerbation: Secondary | ICD-10-CM | POA: Insufficient documentation

## 2013-05-25 DIAGNOSIS — F17200 Nicotine dependence, unspecified, uncomplicated: Secondary | ICD-10-CM | POA: Diagnosis not present

## 2013-05-25 DIAGNOSIS — R05 Cough: Secondary | ICD-10-CM | POA: Insufficient documentation

## 2013-05-25 DIAGNOSIS — Z8659 Personal history of other mental and behavioral disorders: Secondary | ICD-10-CM | POA: Diagnosis not present

## 2013-05-25 LAB — CBC
Platelets: 250 10*3/uL (ref 150–400)
RBC: 4.68 MIL/uL (ref 3.87–5.11)
RDW: 14.1 % (ref 11.5–15.5)
WBC: 11.9 10*3/uL — ABNORMAL HIGH (ref 4.0–10.5)

## 2013-05-25 LAB — BASIC METABOLIC PANEL
Chloride: 103 mEq/L (ref 96–112)
GFR calc Af Amer: >90 mL/min (ref >90)
Potassium: 3.7 mEq/L (ref 3.5–5.1)

## 2013-05-25 LAB — POCT I-STAT TROPONIN I: Troponin i, poc: 0 ng/mL (ref 0.00–0.08)

## 2013-05-25 NOTE — ED Notes (Signed)
Pt in c/o cough and congestion since this am, states this evening she developed central chest pain that radiates into her back, worse with inspiration, pt states she has been coughing up green mucus

## 2013-05-26 ENCOUNTER — Emergency Department (HOSPITAL_COMMUNITY)
Admission: EM | Admit: 2013-05-26 | Discharge: 2013-05-26 | Disposition: A | Discharge status: Home / Self Care | Payer: Medicare Other | Attending: Emergency Medicine | Admitting: Emergency Medicine

## 2013-05-26 DIAGNOSIS — J45901 Unspecified asthma with (acute) exacerbation: Secondary | ICD-10-CM

## 2013-05-26 MED ORDER — PREDNISONE 20 MG PO TABS
60.0000 mg | ORAL_TABLET | Freq: Once | ORAL | Status: AC
  Administered 2013-05-26: 60 mg via ORAL
  Filled 2013-05-26: qty 3

## 2013-05-26 MED ORDER — PREDNISONE 20 MG PO TABS: ORAL_TABLET | ORAL | Status: DC

## 2013-05-26 NOTE — ED Provider Notes (Signed)
CSN: [id number]     Arrival date & time 05/25/13  2201 History     First MD Initiated Contact with Patient 05/26/13 0251     Chief Complaint  Patient presents with  . Chest Pain  . Shortness of Breath   (Consider location/radiation/quality/duration/timing/severity/associated sxs/prior Treatment) HPI This 32 year old has a few days of cough wheezing shortness of breath at times and constant chest pain worse with palpation and torso movement and coughing with reproducible chest wall tenderness mild wheezing pulse oximetry normal room air 99% patient does not feel she needs nebulizer in the ED she already has her inhaler and has not been on steroids recently Past Medical History  Diagnosis Date  . Asthma   . Vertigo   . Acid reflux   . Obesity   . Depression     doing good  . Ovarian cyst    Past Surgical History  Procedure Laterality Date  . Cystectomy     Family History  Problem Relation Age of Onset  . Asthma Mother   . Diabetes Mother   . Heart disease Mother     CHF  . Asthma Sister   . Diabetes Sister   . Heart disease Sister   . Hearing loss Neg Hx    History  Substance Use Topics  . Smoking status: Current Every Day Smoker -- 0.25 packs/day for 10 years    Types: Cigarettes  . Smokeless tobacco: Never Used  . Alcohol Use: No   OB History   Grav Para Term Preterm Abortions TAB SAB Ect Mult Living   1 1 1  0 0 0 0 0 0 1     Review of Systems 10 Systems reviewed and are negative for acute change except as noted in the HPI. Allergies  Amoxicillin  Home Medications   Current Outpatient Rx  Name  Route  Sig  Dispense  Refill  . albuterol (PROVENTIL HFA;VENTOLIN HFA) 108 (90 BASE) MCG/ACT inhaler   Inhalation   Inhale 2 puffs into the lungs every 6 (six) hours as needed for wheezing or shortness of breath.         Marland Kitchen albuterol (PROVENTIL) (5 MG/ML) 0.5% nebulizer solution   Nebulization   Take 2.5 mg by nebulization every 6 (six) hours as needed for  wheezing or shortness of breath.         . DM-Doxylamine-Acetaminophen (NYQUIL COLD & FLU PO)   Oral   Take 15 mLs by mouth at bedtime as needed (for cold symptoms/congestion).         . predniSONE (DELTASONE) 20 MG tablet      2 tabs po daily x 4 days   8 tablet   0    BP 123/81  Pulse 79  Temp(Src) 97.9 F (36.6 C) (Oral)  Resp 18  Wt 280 lb (127.007 kg)  BMI 50.36 kg/m2  SpO2 100%  LMP 03/11/2013 Physical Exam  Nursing note and vitals reviewed. Constitutional:  Awake, alert, nontoxic appearance.  HENT:  Head: Atraumatic.  Eyes: Right eye exhibits no discharge. Left eye exhibits no discharge.  Neck: Neck supple.  Cardiovascular: Normal rate and regular rhythm.   No murmur heard. Pulmonary/Chest: Effort normal. No respiratory distress. She has wheezes. She has no rales. She exhibits tenderness.  Reproducible chest wall tenderness without rash  Abdominal: Soft. There is no tenderness. There is no rebound.  Musculoskeletal: She exhibits no edema and no tenderness.  Baseline ROM, no obvious new focal weakness.  Neurological: She is alert.  Mental status and motor strength appears baseline for patient and situation.  Skin: No rash noted.  Psychiatric: She has a normal mood and affect.    ED Course   Procedures (including critical care time) ECG: Normal sinus rhythm, ventricular rate 95, normal axis, nonspecific T wave abnormality, no comparison ECG available Labs Reviewed  CBC - Abnormal; Notable for the following:    WBC 11.9 (*)    All other components within normal limits  BASIC METABOLIC PANEL - Abnormal; Notable for the following:    GFR calc non Af Amer 83 (*)    All other components within normal limits  POCT I-STAT TROPONIN I   Dg Chest 2 View  05/25/2013   *RADIOLOGY REPORT*  Clinical Data: Chest pain and shortness of breath.  CHEST - 2 VIEW  Comparison: PA and lateral chest 02/05/2013 and 11/02/2011.  Findings: Lungs are clear.  Heart size is normal.   No pneumothorax or pleural fluid.  IMPRESSION: No acute disease.   Original Report Authenticated By: Holley Dexter, M.D.   1. Asthma attack     MDM  I doubt any other EMC precluding discharge at this time including, but not necessarily limited to the following:SBI, PE, ACS.  Hurman Horn, MD 05/26/13 [phone]

## 2013-05-26 NOTE — ED Notes (Signed)
Pt has hx of asthma. Pt has used inhaler twice today with relief

## 2013-05-26 NOTE — ED Notes (Signed)
Pt given water per Minerva Areola, RN

## 2013-06-29 ENCOUNTER — Emergency Department (HOSPITAL_COMMUNITY)
Admission: EM | Admit: 2013-06-29 | Discharge: 2013-06-29 | Disposition: A | Discharge status: Home / Self Care | Payer: Medicare Other | Attending: Emergency Medicine | Admitting: Emergency Medicine

## 2013-06-29 ENCOUNTER — Encounter (HOSPITAL_COMMUNITY): Payer: Self-pay | Admitting: Emergency Medicine

## 2013-06-29 ENCOUNTER — Emergency Department (HOSPITAL_COMMUNITY): Payer: Medicare Other

## 2013-06-29 DIAGNOSIS — Z8659 Personal history of other mental and behavioral disorders: Secondary | ICD-10-CM | POA: Diagnosis not present

## 2013-06-29 DIAGNOSIS — R209 Unspecified disturbances of skin sensation: Secondary | ICD-10-CM | POA: Diagnosis not present

## 2013-06-29 DIAGNOSIS — E669 Obesity, unspecified: Secondary | ICD-10-CM | POA: Insufficient documentation

## 2013-06-29 DIAGNOSIS — R51 Headache: Secondary | ICD-10-CM | POA: Diagnosis not present

## 2013-06-29 DIAGNOSIS — M62838 Other muscle spasm: Secondary | ICD-10-CM | POA: Insufficient documentation

## 2013-06-29 DIAGNOSIS — Z8719 Personal history of other diseases of the digestive system: Secondary | ICD-10-CM | POA: Diagnosis not present

## 2013-06-29 DIAGNOSIS — Z880 Allergy status to penicillin: Secondary | ICD-10-CM | POA: Diagnosis not present

## 2013-06-29 DIAGNOSIS — F17200 Nicotine dependence, unspecified, uncomplicated: Secondary | ICD-10-CM | POA: Insufficient documentation

## 2013-06-29 DIAGNOSIS — Z79899 Other long term (current) drug therapy: Secondary | ICD-10-CM | POA: Insufficient documentation

## 2013-06-29 DIAGNOSIS — M5380 Other specified dorsopathies, site unspecified: Secondary | ICD-10-CM | POA: Diagnosis not present

## 2013-06-29 DIAGNOSIS — Z8742 Personal history of other diseases of the female genital tract: Secondary | ICD-10-CM | POA: Diagnosis not present

## 2013-06-29 DIAGNOSIS — J45909 Unspecified asthma, uncomplicated: Secondary | ICD-10-CM | POA: Insufficient documentation

## 2013-06-29 LAB — CBC
HCT: 39.3 % (ref 36.0–46.0)
MCV: 86.4 fL (ref 78.0–100.0)
Platelets: 256 10*3/uL (ref 150–400)
RBC: 4.55 MIL/uL (ref 3.87–5.11)
WBC: 12.1 10*3/uL — ABNORMAL HIGH (ref 4.0–10.5)

## 2013-06-29 LAB — BASIC METABOLIC PANEL
CO2: 21 mEq/L (ref 19–32)
Chloride: 101 mEq/L (ref 96–112)
Creatinine, Ser: 0.91 mg/dL (ref 0.50–1.10)
Potassium: 3.8 mEq/L (ref 3.5–5.1)

## 2013-06-29 MED ORDER — CYCLOBENZAPRINE HCL 10 MG PO TABS: 10.0000 mg | ORAL_TABLET | Freq: Two times a day (BID) | ORAL | Status: DC | PRN

## 2013-06-29 MED ORDER — DEXAMETHASONE SODIUM PHOSPHATE 4 MG/ML IJ SOLN
10.0000 mg | Freq: Once | INTRAMUSCULAR | Status: DC
  Filled 2013-06-29: qty 3

## 2013-06-29 MED ORDER — SODIUM CHLORIDE 0.9 % IV BOLUS (SEPSIS): 1000.0000 mL | Freq: Once | INTRAVENOUS | Status: DC

## 2013-06-29 MED ORDER — DIPHENHYDRAMINE HCL 50 MG/ML IJ SOLN
12.5000 mg | Freq: Once | INTRAMUSCULAR | Status: DC
  Filled 2013-06-29: qty 1

## 2013-06-29 MED ORDER — CYCLOBENZAPRINE HCL 10 MG PO TABS
5.0000 mg | ORAL_TABLET | Freq: Once | ORAL | Status: AC
  Administered 2013-06-29: 5 mg via ORAL
  Filled 2013-06-29: qty 1

## 2013-06-29 MED ORDER — METOCLOPRAMIDE HCL 5 MG/ML IJ SOLN
10.0000 mg | Freq: Once | INTRAMUSCULAR | Status: DC
  Filled 2013-06-29: qty 2

## 2013-06-29 NOTE — ED Provider Notes (Addendum)
CSN: [id number]     Arrival date & time 06/29/13  0005 History   First MD Initiated Contact with Patient 06/29/13 0049     Chief Complaint  Patient presents with  . Tingling   (Consider location/radiation/quality/duration/timing/severity/associated sxs/prior Treatment) HPI History provided by patient. Went to bed around 8:30 PM in her normal state of health. She woke up at 10 PM with left temporal headache, moderate severity, with associated left arm and left leg and left face tingling "like pins and needles".  No associated weakness. No difficulty with speech, gait. She feels like she is sleeping wrong and does complain of some neck discomfort with this. She states that she had symptoms like this last December, had a CT scan and was discharged home, that her doctor told her was not a stroke. She has not had any return of these symptoms until tonight. She is a smoker without history of hypertension, hyperlipidemia or diabetes. No chest pain or palpitations. No trauma.  Past Medical History  Diagnosis Date  . Asthma   . Vertigo   . Acid reflux   . Obesity   . Depression     doing good  . Ovarian cyst    Past Surgical History  Procedure Laterality Date  . Cystectomy     Family History  Problem Relation Age of Onset  . Asthma Mother   . Diabetes Mother   . Heart disease Mother     CHF  . Asthma Sister   . Diabetes Sister   . Heart disease Sister   . Hearing loss Neg Hx    History  Substance Use Topics  . Smoking status: Current Every Day Smoker -- 0.25 packs/day for 10 years    Types: Cigarettes  . Smokeless tobacco: Never Used  . Alcohol Use: No   OB History   Grav Para Term Preterm Abortions TAB SAB Ect Mult Living   1 1 1  0 0 0 0 0 0 1     Review of Systems  Constitutional: Negative for fever and chills.  HENT: Negative for neck pain and neck stiffness.   Eyes: Negative for pain.  Respiratory: Negative for shortness of breath.   Cardiovascular: Negative for chest  pain.  Gastrointestinal: Negative for abdominal pain.  Genitourinary: Negative for dysuria.  Musculoskeletal: Negative for back pain.  Skin: Negative for rash.  Neurological: Positive for numbness and headaches. Negative for dizziness, syncope, speech difficulty and weakness.  All other systems reviewed and are negative.    Allergies  Amoxicillin  Home Medications   Current Outpatient Rx  Name  Route  Sig  Dispense  Refill  . albuterol (PROVENTIL HFA;VENTOLIN HFA) 108 (90 BASE) MCG/ACT inhaler   Inhalation   Inhale 2 puffs into the lungs every 6 (six) hours as needed for wheezing or shortness of breath.         Marland Kitchen albuterol (PROVENTIL) (5 MG/ML) 0.5% nebulizer solution   Nebulization   Take 2.5 mg by nebulization every 6 (six) hours as needed for wheezing or shortness of breath.         . DM-Doxylamine-Acetaminophen (NYQUIL COLD & FLU PO)   Oral   Take 15 mLs by mouth at bedtime as needed (for cold symptoms/congestion).         . predniSONE (DELTASONE) 20 MG tablet      2 tabs po daily x 4 days   8 tablet   0    BP 138/88  Pulse 89  Temp(Src) 98.4 F (  36.9 C) (Oral)  Resp 14  SpO2 97%  LMP 06/21/2013 Physical Exam  Constitutional: She is oriented to person, place, and time. She appears well-developed and well-nourished.  HENT:  Head: Normocephalic and atraumatic.  No tenderness over temporal artery.  Eyes: EOM are normal. Pupils are equal, round, and reactive to light.  Neck: Neck supple.  Mild left paracervical / trapezial tenderness without midline tenderness or deformity  Cardiovascular: Normal rate, regular rhythm and intact distal pulses.   Pulmonary/Chest: Effort normal and breath sounds normal. No respiratory distress.  Abdominal: Soft. Bowel sounds are normal. She exhibits no distension.  Musculoskeletal: Normal range of motion. She exhibits no edema and no tenderness.  Neurological: She is alert and oriented to person, place, and time. She has  normal reflexes. She displays normal reflexes. No cranial nerve deficit. Coordination normal.  Speech clear. No pronator drift. No facial droop. Sensorium to light touch equal and intact throughout face, upper extremities and lower extremities without any measurable deficit. Equal grips/biceps/triceps/dorsi and plantar flexion.   Skin: Skin is warm and dry.    ED Course  Procedures (including critical care time) Labs Review Results for orders placed during the hospital encounter of 06/29/13  CBC      Result Value Range   WBC 12.1 (*) 4.0 - 10.5 K/uL   RBC 4.55  3.87 - 5.11 MIL/uL   Hemoglobin 13.5  12.0 - 15.0 g/dL   HCT 16.1  09.6 - 04.5 %   MCV 86.4  78.0 - 100.0 fL   MCH 29.7  26.0 - 34.0 pg   MCHC 34.4  30.0 - 36.0 g/dL   RDW 40.9  81.1 - 91.4 %   Platelets 256  150 - 400 K/uL  BASIC METABOLIC PANEL      Result Value Range   Sodium 134 (*) 135 - 145 mEq/L   Potassium 3.8  3.5 - 5.1 mEq/L   Chloride 101  96 - 112 mEq/L   CO2 21  19 - 32 mEq/L   Glucose, Bld 91  70 - 99 mg/dL   BUN 11  6 - 23 mg/dL   Creatinine, Ser 7.82  0.50 - 1.10 mg/dL   Calcium 8.8  8.4 - 95.6 mg/dL   GFR calc non Af Amer 83 (*) >90 mL/min   GFR calc Af Amer >90  >90 mL/min   Ct Head Wo Contrast  06/29/2013   *RADIOLOGY REPORT*  Clinical Data:  patient reports brief tingling sensation and left side of body  CT HEAD WITHOUT CONTRAST CT CERVICAL SPINE WITHOUT CONTRAST  Technique:  Multidetector CT imaging of the head and cervical spine was performed following the standard protocol without intravenous contrast.  Multiplanar CT image reconstructions of the cervical spine were also generated.  Comparison:  10/18/2012  CT HEAD  Findings: The brain has a normal appearance without evidence for hemorrhage, infarction, hydrocephalus, or mass lesion.  There is no extra axial fluid collection.  The skull and paranasal sinuses are normal.  IMPRESSION: No acute intracranial abnormalities.  CT CERVICAL SPINE  Findings: There  is reversal of normal cervical lordosis.  This may be related to muscle spasm or patient positioning.  The vertebral body heights are well preserved.  The facet joints are all well aligned.  The disc spaces appear normal.  IMPRESSION:  1. Reversal of normal cervical lordosis. This may be related to muscle spasm or patient positioning.  2.  No acute findings noted.   Original Report Authenticated By: Ladona Ridgel  Bradly Chris, M.D.   Ct Cervical Spine Wo Contrast  06/29/2013   *RADIOLOGY REPORT*  Clinical Data:  patient reports brief tingling sensation and left side of body  CT HEAD WITHOUT CONTRAST CT CERVICAL SPINE WITHOUT CONTRAST  Technique:  Multidetector CT imaging of the head and cervical spine was performed following the standard protocol without intravenous contrast.  Multiplanar CT image reconstructions of the cervical spine were also generated.  Comparison:  10/18/2012  CT HEAD  Findings: The brain has a normal appearance without evidence for hemorrhage, infarction, hydrocephalus, or mass lesion.  There is no extra axial fluid collection.  The skull and paranasal sinuses are normal.  IMPRESSION: No acute intracranial abnormalities.  CT CERVICAL SPINE  Findings: There is reversal of normal cervical lordosis.  This may be related to muscle spasm or patient positioning.  The vertebral body heights are well preserved.  The facet joints are all well aligned.  The disc spaces appear normal.  IMPRESSION:  1. Reversal of normal cervical lordosis. This may be related to muscle spasm or patient positioning.  2.  No acute findings noted.   Original Report Authenticated By: Signa Kell, M.D.     IV fluids and IV headache cocktail offered. Previous records reviewed from December 2013 with sane presenting complaints and findings. Again not consistent with CVA. NIH Stroke scale 0.   Patient declined any IV medications and Flexeril provided. On recheck symptoms resolved 3:21 AM patient feels completely better, and relates  no acute distress and is requesting to be discharged home.. she agrees to followup with her primary care physician. Prescription for Flexeril provided as needed. MDM  Diagnosis: Headache and left paracervical muscle spasm  Imaging reviewed as above Improved with medication provided  Vital signs and nursing notes and prior records reviewed and considered    Sunnie Nielsen, MD 06/29/13 0322    Date: 06/29/2013  Rate: 82  Rhythm: normal sinus rhythm  QRS Axis: normal  Intervals: normal  ST/T Wave abnormalities: nonspecific ST changes  Conduction Disutrbances:none  Narrative Interpretation:   Old EKG Reviewed: unchanged    Sunnie Nielsen, MD 06/29/13 [phone]

## 2013-06-29 NOTE — ED Notes (Signed)
Pt refusing IV and medication.  Sts "my head doesn't hurt that bad that I need IV medicine". Pt allowed this RN to draw blood for lab work.

## 2013-06-29 NOTE — ED Notes (Signed)
Ambulated Pt around nurses station. Pt stated that she feels much better, denies tingling, dizziness, lightheaded. Also stated that she has feeling back in her hands and feet and that her left hand isn't swollen anymore.

## 2013-06-29 NOTE — ED Notes (Signed)
Pt. reports brief tingling sensation on the whole left side of body this evening , ambulatory , speech clear /no facial symmetry , equal grips.

## 2013-07-03 ENCOUNTER — Emergency Department (HOSPITAL_COMMUNITY)
Admission: EM | Admit: 2013-07-03 | Discharge: 2013-07-03 | Disposition: A | Discharge status: Home / Self Care | Payer: Medicare Other | Attending: Emergency Medicine | Admitting: Emergency Medicine

## 2013-07-03 ENCOUNTER — Encounter (HOSPITAL_COMMUNITY): Payer: Self-pay | Admitting: Emergency Medicine

## 2013-07-03 DIAGNOSIS — Z8659 Personal history of other mental and behavioral disorders: Secondary | ICD-10-CM | POA: Insufficient documentation

## 2013-07-03 DIAGNOSIS — Z8742 Personal history of other diseases of the female genital tract: Secondary | ICD-10-CM | POA: Insufficient documentation

## 2013-07-03 DIAGNOSIS — J45909 Unspecified asthma, uncomplicated: Secondary | ICD-10-CM | POA: Insufficient documentation

## 2013-07-03 DIAGNOSIS — J069 Acute upper respiratory infection, unspecified: Secondary | ICD-10-CM | POA: Diagnosis not present

## 2013-07-03 DIAGNOSIS — Z8719 Personal history of other diseases of the digestive system: Secondary | ICD-10-CM | POA: Insufficient documentation

## 2013-07-03 DIAGNOSIS — R42 Dizziness and giddiness: Secondary | ICD-10-CM | POA: Insufficient documentation

## 2013-07-03 DIAGNOSIS — E669 Obesity, unspecified: Secondary | ICD-10-CM | POA: Insufficient documentation

## 2013-07-03 DIAGNOSIS — Z79899 Other long term (current) drug therapy: Secondary | ICD-10-CM | POA: Diagnosis not present

## 2013-07-03 DIAGNOSIS — F17200 Nicotine dependence, unspecified, uncomplicated: Secondary | ICD-10-CM | POA: Insufficient documentation

## 2013-07-03 MED ORDER — LORAZEPAM 0.5 MG PO TABS: 0.5000 mg | ORAL_TABLET | Freq: Three times a day (TID) | ORAL | Status: DC | PRN

## 2013-07-03 MED ORDER — LORAZEPAM 0.5 MG PO TABS
0.5000 mg | ORAL_TABLET | Freq: Once | ORAL | Status: AC
  Administered 2013-07-03: 0.5 mg via ORAL
  Filled 2013-07-03: qty 1

## 2013-07-03 MED ORDER — PSEUDOEPHEDRINE HCL 30 MG PO TABS
30.0000 mg | ORAL_TABLET | Freq: Once | ORAL | Status: AC
  Administered 2013-07-03: 30 mg via ORAL
  Filled 2013-07-03: qty 1

## 2013-07-03 MED ORDER — LORAZEPAM 1 MG PO TABS: 0.5000 mg | ORAL_TABLET | Freq: Three times a day (TID) | ORAL | Status: DC | PRN

## 2013-07-03 NOTE — ED Provider Notes (Signed)
Medical screening examination/treatment/procedure(s) were performed by non-physician practitioner and as supervising physician I was immediately available for consultation/collaboration.  Darlys Gales, MD 07/03/13 [phone]

## 2013-07-03 NOTE — ED Notes (Addendum)
Pt. reports nasal congestion /runny nose , occasional productive cough , watery eyes and slight dizziness for several days .

## 2013-07-03 NOTE — ED Notes (Signed)
Unable to obtain Ativan PO from pyxis at this time.  Clydie Braun in pharmacy notified of same.  Awaiting medication.

## 2013-07-03 NOTE — ED Provider Notes (Signed)
CSN: [id number]     Arrival date & time 07/03/13  0047 History   None    Chief Complaint  Patient presents with  . Nasal Congestion   (Consider location/radiation/quality/duration/timing/severity/associated sxs/prior Treatment) HPI History provided by pt.   Pt presents w/ nasal and sinus congestion, rhinorrhea and cough productive of white sputum x 4 days.  Today she developed intermittent dizziness, described as feeling off balance, that occurred with standing from seated position and rotating her head.  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  Denies fever, headache, vision changes, vomiting, hearing impairment and tinnitus.  No recent head trauma.  No h/o HTN, diabetes or high cholesterol and does not smoke cigarettes.   Past Medical History  Diagnosis Date  . Asthma   . Vertigo   . Acid reflux   . Obesity   . Depression     doing good  . Ovarian cyst    Past Surgical History  Procedure Laterality Date  . Cystectomy     Family History  Problem Relation Age of Onset  . Asthma Mother   . Diabetes Mother   . Heart disease Mother     CHF  . Asthma Sister   . Diabetes Sister   . Heart disease Sister   . Hearing loss Neg Hx    History  Substance Use Topics  . Smoking status: Current Every Day Smoker -- 0.25 packs/day for 10 years    Types: Cigarettes  . Smokeless tobacco: Never Used  . Alcohol Use: No   OB History   Grav Para Term Preterm Abortions TAB SAB Ect Mult Living   1 1 1  0 0 0 0 0 0 1     Review of Systems  All other systems reviewed and are negative.    Allergies  Amoxicillin  Home Medications   Current Outpatient Rx  Name  Route  Sig  Dispense  Refill  . albuterol (PROVENTIL HFA;VENTOLIN HFA) 108 (90 BASE) MCG/ACT inhaler   Inhalation   Inhale 2 puffs into the lungs every 6 (six) hours as needed for wheezing or shortness of breath.         Marland Kitchen albuterol (PROVENTIL) (5 MG/ML) 0.5% nebulizer solution   Nebulization   Take  2.5 mg by nebulization every 6 (six) hours as needed for wheezing or shortness of breath.         . cyclobenzaprine (FLEXERIL) 10 MG tablet   Oral   Take 1 tablet (10 mg total) by mouth 2 (two) times daily as needed for muscle spasms.   20 tablet   0   . predniSONE (DELTASONE) 20 MG tablet      2 tabs po daily x 4 days   8 tablet   0   . Pseudoeph-Doxylamine-DM-APAP (NYQUIL PO)   Oral   Take 10 mLs by mouth daily as needed (cough and cold).          BP 137/84  Pulse 93  Temp(Src) 98.8 F (37.1 C) (Oral)  Resp 14  SpO2 98%  LMP 06/21/2013 Physical Exam  Nursing note and vitals reviewed. Constitutional: She is oriented to person, place, and time. She appears well-developed and well-nourished. No distress.  HENT:  Head: Normocephalic and atraumatic.  Poor light reflex bilateral TM.  Nml EAC bilaterally; no impacted cerumen.  Mild maxillary and ethmoid sinus ttp bilaterally.  Nasal congestion.  Nml tonsils and posterior pharynx.   Eyes:  Normal appearance  Neck: Normal range  of motion.  Cardiovascular: Normal rate, regular rhythm and intact distal pulses.   Pulmonary/Chest: Effort normal and breath sounds normal.  Musculoskeletal: Normal range of motion.  Lymphadenopathy:    She has no cervical adenopathy.  Neurological: She is alert and oriented to person, place, and time. No sensory deficit. Coordination normal.  CN 3-12 intact.  No nystagmus. 5/5 and equal upper and lower extremity strength.  No past pointing.   Nml gait but patient reports dizziness upon standing.  Skin: Skin is warm and dry. No rash noted.  Psychiatric: She has a normal mood and affect. Her behavior is normal.    ED Course  Procedures (including critical care time) Labs Review Labs Reviewed - No data to display Imaging Review No results found.  MDM   1. Dizziness   2. URI (upper respiratory infection)    32yo F presents w/ cough and sinus/nasal congestion x 4 days and intermittent  dizziness today.  Has been diagnosed w/ vertigo in the past.  No recent head trauma or medication changes.  On exam, afebrile, nasal congestion, sinus tenderness, poor light reflex bilateral TM, no cerumen impaction, no focal neuro deficits.  Reported dizzy upon standing and with walking, but gait nml.  Pt to receive sudafed and 0.5mg  ativan.  Dizziness may be d/t BPPV or more likely secondary to sinus congestion.  Will reassess shortly.  3:16 AM   Dizziness improved.  Pt asymptomatic w/ ambulation on repeat exam.  Prescribed six 0.5mg  ativan and recommended sudafed bid and f/u with PCP as well.  Return precautions discussed.    Otilio Miu, PA-C 07/03/13 [phone]

## 2013-07-14 DIAGNOSIS — Z13220 Encounter for screening for lipoid disorders: Secondary | ICD-10-CM | POA: Diagnosis not present

## 2013-07-14 DIAGNOSIS — J209 Acute bronchitis, unspecified: Secondary | ICD-10-CM | POA: Diagnosis not present

## 2013-07-14 DIAGNOSIS — J019 Acute sinusitis, unspecified: Secondary | ICD-10-CM | POA: Diagnosis not present

## 2013-07-14 DIAGNOSIS — Z131 Encounter for screening for diabetes mellitus: Secondary | ICD-10-CM | POA: Diagnosis not present

## 2013-08-10 ENCOUNTER — Emergency Department (HOSPITAL_COMMUNITY): Payer: Medicare Other

## 2013-08-10 ENCOUNTER — Emergency Department (HOSPITAL_COMMUNITY)
Admission: EM | Admit: 2013-08-10 | Discharge: 2013-08-10 | Disposition: A | Discharge status: Home / Self Care | Payer: Medicare Other | Attending: Emergency Medicine | Admitting: Emergency Medicine

## 2013-08-10 ENCOUNTER — Encounter (HOSPITAL_COMMUNITY): Payer: Self-pay | Admitting: Emergency Medicine

## 2013-08-10 DIAGNOSIS — Z8742 Personal history of other diseases of the female genital tract: Secondary | ICD-10-CM | POA: Diagnosis not present

## 2013-08-10 DIAGNOSIS — R42 Dizziness and giddiness: Secondary | ICD-10-CM | POA: Diagnosis not present

## 2013-08-10 DIAGNOSIS — Z8719 Personal history of other diseases of the digestive system: Secondary | ICD-10-CM | POA: Diagnosis not present

## 2013-08-10 DIAGNOSIS — E669 Obesity, unspecified: Secondary | ICD-10-CM | POA: Diagnosis not present

## 2013-08-10 DIAGNOSIS — Z79899 Other long term (current) drug therapy: Secondary | ICD-10-CM | POA: Insufficient documentation

## 2013-08-10 DIAGNOSIS — F17200 Nicotine dependence, unspecified, uncomplicated: Secondary | ICD-10-CM | POA: Diagnosis not present

## 2013-08-10 DIAGNOSIS — J45901 Unspecified asthma with (acute) exacerbation: Secondary | ICD-10-CM | POA: Diagnosis not present

## 2013-08-10 DIAGNOSIS — Z8659 Personal history of other mental and behavioral disorders: Secondary | ICD-10-CM | POA: Insufficient documentation

## 2013-08-10 DIAGNOSIS — R079 Chest pain, unspecified: Secondary | ICD-10-CM | POA: Diagnosis not present

## 2013-08-10 DIAGNOSIS — J209 Acute bronchitis, unspecified: Secondary | ICD-10-CM | POA: Diagnosis not present

## 2013-08-10 DIAGNOSIS — J40 Bronchitis, not specified as acute or chronic: Secondary | ICD-10-CM

## 2013-08-10 MED ORDER — ALBUTEROL SULFATE (5 MG/ML) 0.5% IN NEBU
5.0000 mg | INHALATION_SOLUTION | Freq: Once | RESPIRATORY_TRACT | Status: AC
  Administered 2013-08-10: 5 mg via RESPIRATORY_TRACT
  Filled 2013-08-10: qty 1

## 2013-08-10 MED ORDER — PREDNISONE 20 MG PO TABS: 60.0000 mg | ORAL_TABLET | Freq: Every day | ORAL | Status: DC

## 2013-08-10 MED ORDER — MECLIZINE HCL 25 MG PO TABS: 25.0000 mg | ORAL_TABLET | Freq: Three times a day (TID) | ORAL | Status: DC | PRN

## 2013-08-10 MED ORDER — ASPIRIN 325 MG PO TABS: 325.0000 mg | ORAL_TABLET | ORAL | Status: DC

## 2013-08-10 MED ORDER — MECLIZINE HCL 25 MG PO TABS
25.0000 mg | ORAL_TABLET | Freq: Once | ORAL | Status: AC
  Administered 2013-08-10: 25 mg via ORAL
  Filled 2013-08-10: qty 1

## 2013-08-10 NOTE — ED Notes (Signed)
Pt returned from radiology. No complaints at this time.

## 2013-08-10 NOTE — ED Notes (Addendum)
Pt reports when she woke up she took some cough medicine and did a breathing treatment. Reports she has had a cough and cold the past couple of days, then went to the grocery store and started coughing so hard she thought she was going to pass out. Pt c/o SOB currently. Pt sts she is having some central chest pain that radiates to left arm. Nad, skin warm and dry, resp e/u.

## 2013-08-10 NOTE — ED Provider Notes (Signed)
CSN: [id number]     Arrival date & time 08/10/13  9147 History   First MD Initiated Contact with Patient 08/10/13 0900     Chief Complaint  Patient presents with  . Chest Pain  . Cough  . Dizziness   (Consider location/radiation/quality/duration/timing/severity/associated sxs/prior Treatment) Patient is a 32 y.o. female presenting with chest pain and cough.  Chest Pain Associated symptoms: cough   Cough Associated symptoms: chest pain    Pt with history of asthma vertigo, usually worsened when she gets URI symptoms, reports she had cough and nasal congestion for the last few days. Cough is productive of green sputum, associated with increased wheezing but no fever and no vomiting. She has been using home nebs with good improvement. Reports an episode of room-spinning dizziness while at the grocery store earlier today. She denies any symptoms at the time of my eval.   Past Medical History  Diagnosis Date  . Asthma   . Vertigo   . Acid reflux   . Obesity   . Depression     doing good  . Ovarian cyst    Past Surgical History  Procedure Laterality Date  . Cystectomy     Family History  Problem Relation Age of Onset  . Asthma Mother   . Diabetes Mother   . Heart disease Mother     CHF  . Asthma Sister   . Diabetes Sister   . Heart disease Sister   . Hearing loss Neg Hx    History  Substance Use Topics  . Smoking status: Current Every Day Smoker -- 0.25 packs/day for 10 years    Types: Cigarettes  . Smokeless tobacco: Never Used  . Alcohol Use: No   OB History   Grav Para Term Preterm Abortions TAB SAB Ect Mult Living   1 1 1  0 0 0 0 0 0 1     Review of Systems  Respiratory: Positive for cough.   Cardiovascular: Positive for chest pain.   All other systems reviewed and are negative except as noted in HPI.   Allergies  Amoxicillin  Home Medications   Current Outpatient Rx  Name  Route  Sig  Dispense  Refill  . albuterol (PROVENTIL HFA;VENTOLIN HFA) 108  (90 BASE) MCG/ACT inhaler   Inhalation   Inhale 2 puffs into the lungs every 6 (six) hours as needed for wheezing or shortness of breath.         Marland Kitchen albuterol (PROVENTIL) (5 MG/ML) 0.5% nebulizer solution   Nebulization   Take 2.5 mg by nebulization every 6 (six) hours as needed for wheezing or shortness of breath.         . guaiFENesin-codeine (ROBITUSSIN AC) 100-10 MG/5ML syrup   Oral   Take 10 mLs by mouth 3 (three) times daily as needed for cough.          BP 125/65  Pulse 94  Temp(Src) 98.3 F (36.8 C) (Oral)  Resp 20  SpO2 100%  LMP 07/15/2013 Physical Exam  Nursing note and vitals reviewed. Constitutional: She is oriented to person, place, and time. She appears well-developed and well-nourished.  HENT:  Head: Normocephalic and atraumatic.  Eyes: EOM are normal. Pupils are equal, round, and reactive to light.  Neck: Normal range of motion. Neck supple.  Cardiovascular: Normal rate, normal heart sounds and intact distal pulses.   Pulmonary/Chest: Effort normal. No respiratory distress. She has wheezes. She has no rales.  Abdominal: Bowel sounds are normal. She exhibits no distension.  There is no tenderness.  Musculoskeletal: Normal range of motion. She exhibits no edema and no tenderness.  Neurological: She is alert and oriented to person, place, and time. She has normal strength. No cranial nerve deficit or sensory deficit.  Skin: Skin is warm and dry. No rash noted.  Psychiatric: She has a normal mood and affect.    ED Course  Procedures (including critical care time) Labs Review Labs Reviewed - No data to display Imaging Review Dg Chest 2 View  08/10/2013   CLINICAL DATA:  Chest pain. Cough and dizziness.  EXAM: CHEST  2 VIEW  COMPARISON:  05/25/2013.  FINDINGS: Cardiopericardial silhouette within normal limits. Mediastinal contours normal. Trachea midline. No airspace disease or effusion. Monitoring leads project over the chest.  IMPRESSION: No active  cardiopulmonary disease.   Electronically Signed   By: Andreas Newport M.D.   On: 08/10/2013 10:16    EKG Interpretation     Ventricular Rate:  88 PR Interval:  134 QRS Duration: 81 QT Interval:  359 QTC Calculation: 435 R Axis:   45 Text Interpretation:  Sinus rhythm Borderline T wave abnormalities No significant change since last tracing            MDM   1. Bronchitis   2. Vertigo     Pt feeling better after neb. No further dizziness. CXR clear. No concern for cardiac etiology. Will d/c with prednisone, Meclizine, PCP followup.     Keshara Kiger B. Bernette Mayers, MD 08/10/13 1120

## 2013-08-10 NOTE — ED Notes (Signed)
Pt ambulated to bathroom and back without assistance and no difficulty.

## 2013-08-10 NOTE — ED Notes (Signed)
Pt went to [employer] this morning, felt dizzy and broke out into a sweat, feels a little short of breath. Chest pain when coughing, coughing up greenish-brownish phlegm. Pain in chest only when coughing. Pt in NAD

## 2013-08-10 NOTE — ED Notes (Signed)
Pt sts she has been feeling dizzy with standing but has vertigo and sts it feels the same way, started a few months ago.

## 2013-09-10 ENCOUNTER — Emergency Department (HOSPITAL_COMMUNITY)
Admission: EM | Admit: 2013-09-10 | Discharge: 2013-09-10 | Disposition: A | Discharge status: Home / Self Care | Payer: Medicare Other | Attending: Emergency Medicine | Admitting: Emergency Medicine

## 2013-09-10 ENCOUNTER — Emergency Department (HOSPITAL_COMMUNITY): Payer: Medicare Other

## 2013-09-10 DIAGNOSIS — F17200 Nicotine dependence, unspecified, uncomplicated: Secondary | ICD-10-CM | POA: Diagnosis not present

## 2013-09-10 DIAGNOSIS — Z8639 Personal history of other endocrine, nutritional and metabolic disease: Secondary | ICD-10-CM | POA: Insufficient documentation

## 2013-09-10 DIAGNOSIS — Z862 Personal history of diseases of the blood and blood-forming organs and certain disorders involving the immune mechanism: Secondary | ICD-10-CM | POA: Diagnosis not present

## 2013-09-10 DIAGNOSIS — Z79899 Other long term (current) drug therapy: Secondary | ICD-10-CM | POA: Insufficient documentation

## 2013-09-10 DIAGNOSIS — E669 Obesity, unspecified: Secondary | ICD-10-CM | POA: Insufficient documentation

## 2013-09-10 DIAGNOSIS — J45901 Unspecified asthma with (acute) exacerbation: Secondary | ICD-10-CM | POA: Diagnosis not present

## 2013-09-10 DIAGNOSIS — IMO0002 Reserved for concepts with insufficient information to code with codable children: Secondary | ICD-10-CM | POA: Insufficient documentation

## 2013-09-10 DIAGNOSIS — R0602 Shortness of breath: Secondary | ICD-10-CM | POA: Diagnosis not present

## 2013-09-10 DIAGNOSIS — Z8719 Personal history of other diseases of the digestive system: Secondary | ICD-10-CM | POA: Insufficient documentation

## 2013-09-10 DIAGNOSIS — Z8659 Personal history of other mental and behavioral disorders: Secondary | ICD-10-CM | POA: Insufficient documentation

## 2013-09-10 DIAGNOSIS — Z880 Allergy status to penicillin: Secondary | ICD-10-CM | POA: Insufficient documentation

## 2013-09-10 DIAGNOSIS — J45909 Unspecified asthma, uncomplicated: Secondary | ICD-10-CM | POA: Diagnosis not present

## 2013-09-10 DIAGNOSIS — J40 Bronchitis, not specified as acute or chronic: Secondary | ICD-10-CM

## 2013-09-10 MED ORDER — PREDNISONE 20 MG PO TABS
60.0000 mg | ORAL_TABLET | Freq: Once | ORAL | Status: AC
  Administered 2013-09-10: 60 mg via ORAL
  Filled 2013-09-10: qty 3

## 2013-09-10 MED ORDER — PREDNISONE 20 MG PO TABS: ORAL_TABLET | ORAL | Status: DC

## 2013-09-10 MED ORDER — ALBUTEROL SULFATE (5 MG/ML) 0.5% IN NEBU: 2.5000 mg | INHALATION_SOLUTION | RESPIRATORY_TRACT | Status: DC | PRN

## 2013-09-10 MED ORDER — AZITHROMYCIN 250 MG PO TABS: ORAL_TABLET | ORAL | Status: DC

## 2013-09-10 MED ORDER — IPRATROPIUM BROMIDE 0.02 % IN SOLN
0.5000 mg | Freq: Once | RESPIRATORY_TRACT | Status: AC
  Administered 2013-09-10: 0.5 mg via RESPIRATORY_TRACT
  Filled 2013-09-10: qty 2.5

## 2013-09-10 MED ORDER — ALBUTEROL SULFATE (5 MG/ML) 0.5% IN NEBU
5.0000 mg | INHALATION_SOLUTION | Freq: Once | RESPIRATORY_TRACT | Status: AC
  Administered 2013-09-10: 5 mg via RESPIRATORY_TRACT
  Filled 2013-09-10: qty 1

## 2013-09-10 NOTE — ED Notes (Signed)
Pt ambulated with pulse ox. O2 sats 97% while ambulating. Pt tolerated ambulating well.

## 2013-09-10 NOTE — ED Notes (Signed)
Pt c/o productive cough and wheezing. Pt has hx of bronchitis. States symptoms started 2 days ago. Pt states she has chest tightness when she coughs. Pt denies pain. Also states she has nasal congestion. Pt with no acute distress.

## 2013-09-10 NOTE — ED Provider Notes (Signed)
CSN: [id number]     Arrival date & time 09/10/13  1750 History  This chart was scribed for non-physician practitioner, Junious Silk, PA-C,working with Suzi Roots, MD, by Karle Plumber, ED Scribe.  This patient was seen in room WTR9/WTR9 and the patient's care was started at 7:29 PM.  Chief Complaint  Patient presents with  . Wheezing  . Cough   The history is provided by the patient. No language interpreter was used.   HPI Comments:  Stacy Blackwell is a 32 y.o. female wwho presents to the Emergency Department complaining of chest tightness onset today, cough and wheezing onset two days. Pt states the chest tightness and pain worsen with inspiration. Pt states her symptoms are all similar to her past asthma exacerbations. Pt states she had a nebulizer treatment this morning with moderate relief. She denies fever or hemoptysis. Pt denies any recent travel. She denies any recent surgeries.  Past Medical History  Diagnosis Date  . Asthma   . Vertigo   . Acid reflux   . Obesity   . Depression     doing good  . Ovarian cyst    Past Surgical History  Procedure Laterality Date  . Cystectomy     Family History  Problem Relation Age of Onset  . Asthma Mother   . Diabetes Mother   . Heart disease Mother     CHF  . Asthma Sister   . Diabetes Sister   . Heart disease Sister   . Hearing loss Neg Hx    History  Substance Use Topics  . Smoking status: Current Every Day Smoker -- 0.25 packs/day for 10 years    Types: Cigarettes  . Smokeless tobacco: Never Used  . Alcohol Use: No   OB History   Grav Para Term Preterm Abortions TAB SAB Ect Mult Living   1 1 1  0 0 0 0 0 0 1     Review of Systems  Constitutional: Negative for fever.  HENT: Positive for congestion (nasal).   Respiratory: Positive for cough, chest tightness and wheezing.   All other systems reviewed and are negative.    Allergies  Amoxicillin  Home Medications   Current Outpatient Rx  Name   Route  Sig  Dispense  Refill  . albuterol (PROVENTIL HFA;VENTOLIN HFA) 108 (90 BASE) MCG/ACT inhaler   Inhalation   Inhale 2 puffs into the lungs every 6 (six) hours as needed for wheezing or shortness of breath.         Marland Kitchen albuterol (PROVENTIL) (5 MG/ML) 0.5% nebulizer solution   Nebulization   Take 2.5 mg by nebulization every 6 (six) hours as needed for wheezing or shortness of breath.         . guaiFENesin-codeine (ROBITUSSIN AC) 100-10 MG/5ML syrup   Oral   Take 10 mLs by mouth 3 (three) times daily as needed for cough.         . meclizine (ANTIVERT) 25 MG tablet   Oral   Take 1 tablet (25 mg total) by mouth 3 (three) times daily as needed for dizziness.   30 tablet   0   . predniSONE (DELTASONE) 20 MG tablet   Oral   Take 3 tablets (60 mg total) by mouth daily.   15 tablet   0    Triage Vitals: BP 140/76  Pulse 84  Temp(Src) 98.6 F (37 C) (Oral)  Resp 16  SpO2 100% Physical Exam  Nursing note and vitals reviewed. Constitutional: She  is oriented to person, place, and time. She appears well-developed and well-nourished. No distress.  HENT:  Head: Normocephalic and atraumatic.  Right Ear: External ear normal.  Left Ear: External ear normal.  Nose: Nose normal.  Mouth/Throat: Oropharynx is clear and moist.  Eyes: Conjunctivae are normal.  Neck: Normal range of motion.  Cardiovascular: Normal rate, regular rhythm and normal heart sounds.   Pulmonary/Chest: Effort normal. No stridor. No respiratory distress. She has wheezes. She has no rales.  Abdominal: Soft. She exhibits no distension.  Musculoskeletal: Normal range of motion.  Neurological: She is alert and oriented to person, place, and time. She has normal strength.  Skin: Skin is warm and dry. She is not diaphoretic. No erythema.  Psychiatric: She has a normal mood and affect. Her behavior is normal.    ED Course  Procedures (including critical care time) DIAGNOSTIC STUDIES: Oxygen Saturation is 100%  on RA, normal by my interpretation.   COORDINATION OF CARE: 7:37 PM- Will treat with antibiotics and steroids. Will give nebulizer treatment in ED. Pt verbalizes understanding and agrees to plan.  Medications  albuterol (PROVENTIL) (5 MG/ML) 0.5% nebulizer solution 5 mg (5 mg Nebulization Given 09/10/13 1940)  ipratropium (ATROVENT) nebulizer solution 0.5 mg (0.5 mg Nebulization Given 09/10/13 1940)  predniSONE (DELTASONE) tablet 60 mg (60 mg Oral Given 09/10/13 1957)    Labs Review Labs Reviewed - No data to display Imaging Review Dg Chest 2 View  09/10/2013   CLINICAL DATA:  Shortness of breath.  EXAM: CHEST  2 VIEW  COMPARISON:  August 10, 2013.  FINDINGS: The heart size and mediastinal contours are within normal limits. Stable central bronchitic changes. No acute pulmonary disease is noted. No pneumothorax or pleural effusion is noted. The visualized skeletal structures are unremarkable.  IMPRESSION: No acute cardiopulmonary abnormality seen.   Electronically Signed   By: Roque Lias M.D.   On: 09/10/2013 19:16    EKG Interpretation   None       MDM   1. Asthma   2. Bronchitis    Patient ambulated in ED with O2 saturations maintained >90, no current signs of respiratory distress. Lung exam improved after nebulizer treatment. Prednisone given in the ED and pt will bd dc with 5 day burst. Pt states they are breathing at baseline. Pt has been instructed to continue using prescribed medications and to speak with PCP about today's exacerbation.   I personally performed the services described in this documentation, which was scribed in my presence. The recorded information has been reviewed and is accurate.     Mora Bellman, PA-C 09/10/13 2023

## 2013-09-10 NOTE — ED Notes (Signed)
RT at bedside.

## 2013-09-11 NOTE — ED Provider Notes (Signed)
Medical screening examination/treatment/procedure(s) were conducted as a shared visit with non-physician practitioner(s) and myself.  I personally evaluated the patient during the encounter.  EKG Interpretation   None       Pt c/o wheezing c/w prior asthma.  Alb neb. Recheck no increased wob. No distress. cxr without acute infiltrate.    Suzi Roots, MD 09/11/13 1754

## 2013-09-20 DIAGNOSIS — E669 Obesity, unspecified: Secondary | ICD-10-CM | POA: Diagnosis not present

## 2013-09-20 DIAGNOSIS — J45902 Unspecified asthma with status asthmaticus: Secondary | ICD-10-CM | POA: Diagnosis not present

## 2013-09-20 DIAGNOSIS — S335XXA Sprain of ligaments of lumbar spine, initial encounter: Secondary | ICD-10-CM | POA: Diagnosis not present

## 2013-09-26 ENCOUNTER — Inpatient Hospital Stay (HOSPITAL_COMMUNITY): Payer: Medicare Other

## 2013-09-26 ENCOUNTER — Inpatient Hospital Stay (HOSPITAL_COMMUNITY)
Admission: AD | Admit: 2013-09-26 | Discharge: 2013-09-26 | Disposition: A | Discharge status: Home / Self Care | Payer: Medicare Other | Source: Ambulatory Visit | Attending: Obstetrics & Gynecology | Admitting: Obstetrics & Gynecology

## 2013-09-26 ENCOUNTER — Encounter (HOSPITAL_COMMUNITY): Payer: Self-pay | Admitting: Radiology

## 2013-09-26 DIAGNOSIS — K5900 Constipation, unspecified: Secondary | ICD-10-CM | POA: Diagnosis not present

## 2013-09-26 DIAGNOSIS — N760 Acute vaginitis: Secondary | ICD-10-CM

## 2013-09-26 DIAGNOSIS — A499 Bacterial infection, unspecified: Secondary | ICD-10-CM | POA: Insufficient documentation

## 2013-09-26 DIAGNOSIS — R1031 Right lower quadrant pain: Secondary | ICD-10-CM | POA: Diagnosis not present

## 2013-09-26 DIAGNOSIS — B9689 Other specified bacterial agents as the cause of diseases classified elsewhere: Secondary | ICD-10-CM | POA: Insufficient documentation

## 2013-09-26 LAB — URINALYSIS, ROUTINE W REFLEX MICROSCOPIC
Leukocytes, UA: NEGATIVE
Nitrite: NEGATIVE
Protein, ur: NEGATIVE mg/dL
Specific Gravity, Urine: >1.03 — ABNORMAL HIGH (ref 1.005–1.030)
Urobilinogen, UA: 0.2 mg/dL (ref 0.0–1.0)
pH: 6 (ref 5.0–8.0)

## 2013-09-26 LAB — COMPREHENSIVE METABOLIC PANEL
ALT: 24 U/L (ref 0–35)
AST: 15 U/L (ref 0–37)
Alkaline Phosphatase: 53 U/L (ref 39–117)
CO2: 24 mEq/L (ref 19–32)
Calcium: 9 mg/dL (ref 8.4–10.5)
Chloride: 101 mEq/L (ref 96–112)
GFR calc Af Amer: >90 mL/min (ref >90)
GFR calc non Af Amer: 90 mL/min — ABNORMAL LOW (ref >90)
Potassium: 3.9 mEq/L (ref 3.5–5.1)
Sodium: 136 mEq/L (ref 135–145)
Total Protein: 6.9 g/dL (ref 6.0–8.3)

## 2013-09-26 LAB — WET PREP, GENITAL: Trich, Wet Prep: NONE SEEN

## 2013-09-26 LAB — CBC
MCH: 28.7 pg (ref 26.0–34.0)
Platelets: 212 10*3/uL (ref 150–400)
RBC: 4.56 MIL/uL (ref 3.87–5.11)
RDW: 13.9 % (ref 11.5–15.5)
WBC: 8.1 10*3/uL (ref 4.0–10.5)

## 2013-09-26 MED ORDER — IOHEXOL 300 MG/ML  SOLN
50.0000 mL | INTRAMUSCULAR | Status: AC
  Administered 2013-09-26: 50 mL via ORAL

## 2013-09-26 MED ORDER — POLYETHYLENE GLYCOL 3350 17 G PO PACK: 17.0000 g | PACK | Freq: Every day | ORAL | Status: DC

## 2013-09-26 MED ORDER — METRONIDAZOLE 500 MG PO TABS: 500.0000 mg | ORAL_TABLET | Freq: Two times a day (BID) | ORAL | Status: DC

## 2013-09-26 MED ORDER — HYDROMORPHONE HCL PF 1 MG/ML IJ SOLN
1.0000 mg | Freq: Once | INTRAMUSCULAR | Status: AC
  Administered 2013-09-26: 1 mg via INTRAMUSCULAR
  Filled 2013-09-26: qty 1

## 2013-09-26 MED ORDER — IOHEXOL 300 MG/ML  SOLN
100.0000 mL | Freq: Once | INTRAMUSCULAR | Status: AC | PRN
  Administered 2013-09-26: 100 mL via INTRAVENOUS

## 2013-09-26 MED ORDER — HYDROMORPHONE HCL PF 1 MG/ML IJ SOLN: 1.0000 mg | Freq: Once | INTRAMUSCULAR | Status: DC

## 2013-09-26 NOTE — MAU Provider Note (Signed)
Attestation of Attending Supervision of Advanced Practitioner (CNM/NP): Evaluation and management procedures were performed by the Advanced Practitioner under my supervision and collaboration.  I have reviewed the Advanced Practitioner's note and chart, and I agree with the management and plan.  HARRAWAY-SMITH, Haili Donofrio 10:39 PM

## 2013-09-26 NOTE — MAU Provider Note (Signed)
History     CSN: [id number]  Arrival date and time: 09/26/13 1038   None     Chief Complaint  Patient presents with  . Abdominal Pain   HPI  [name] is a 32 y.o. female G1P1001 who presents with severe right lower quadrant pain that started 3 days ago. The pain came on all of a sudden; she had one episode of vomiting this morning. When she gets in the fetal position it makes the pain better, however nothing else makes it better. She has not had anything to eat today. Pt said she had difficulty riding in the car on the way here due to the pain. Pt has a primary care Dr.   Sheral Blackwell History   Grav Para Term Preterm Abortions TAB SAB Ect Mult Living   1 1 1  0 0 0 0 0 0 1      Past Medical History  Diagnosis Date  . Asthma   . Vertigo   . Acid reflux   . Obesity   . Depression     doing good  . Ovarian cyst     Past Surgical History  Procedure Laterality Date  . Cystectomy      Family History  Problem Relation Age of Onset  . Asthma Mother   . Diabetes Mother   . Heart disease Mother     CHF  . Asthma Sister   . Diabetes Sister   . Heart disease Sister   . Hearing loss Neg Hx     History  Substance Use Topics  . Smoking status: Current Every Day Smoker -- 0.25 packs/day for 10 years    Types: Cigarettes  . Smokeless tobacco: Never Used  . Alcohol Use: No    Allergies:  Allergies  Allergen Reactions  . Amoxicillin Hives and Swelling    Swelling in the eyes    Prescriptions prior to admission  Medication Sig Dispense Refill  . albuterol (PROVENTIL HFA;VENTOLIN HFA) 108 (90 BASE) MCG/ACT inhaler Inhale 2 puffs into the lungs every 6 (six) hours as needed for wheezing or shortness of breath.      Marland Kitchen albuterol (PROVENTIL) (5 MG/ML) 0.5% nebulizer solution Take 2.5 mg by nebulization every 6 (six) hours as needed for wheezing or shortness of breath.      . docusate sodium (COLACE) 100 MG capsule Take 100 mg by mouth 2 (two) times daily.        Results for orders placed during the hospital encounter of 09/26/13 (from the past 24 hour(s))  URINALYSIS, ROUTINE W REFLEX MICROSCOPIC     Status: Abnormal   Collection Time    09/26/13 10:42 AM      Result Value Range   Color, Urine YELLOW  YELLOW   APPearance CLEAR  CLEAR   Specific Gravity, Urine >1.030 (*) 1.005 - 1.030   pH 6.0  5.0 - 8.0   Glucose, UA NEGATIVE  NEGATIVE mg/dL   Hgb urine dipstick NEGATIVE  NEGATIVE   Bilirubin Urine NEGATIVE  NEGATIVE   Ketones, ur NEGATIVE  NEGATIVE mg/dL   Protein, ur NEGATIVE  NEGATIVE mg/dL   Urobilinogen, UA 0.2  0.0 - 1.0 mg/dL   Nitrite NEGATIVE  NEGATIVE   Leukocytes, UA NEGATIVE  NEGATIVE  POCT PREGNANCY, URINE     Status: None   Collection Time    09/26/13 10:55 AM      Result Value Range   Preg Test, Ur NEGATIVE  NEGATIVE  CBC  Status: None   Collection Time    09/26/13 11:04 AM      Result Value Range   WBC 8.1  4.0 - 10.5 K/uL   RBC 4.56  3.87 - 5.11 MIL/uL   Hemoglobin 13.1  12.0 - 15.0 g/dL   HCT 16.1  09.6 - 04.5 %   MCV 85.5  78.0 - 100.0 fL   MCH 28.7  26.0 - 34.0 pg   MCHC 33.6  30.0 - 36.0 g/dL   RDW 40.9  81.1 - 91.4 %   Platelets 212  150 - 400 K/uL  COMPREHENSIVE METABOLIC PANEL     Status: Abnormal   Collection Time    09/26/13 11:04 AM      Result Value Range   Sodium 136  135 - 145 mEq/L   Potassium 3.9  3.5 - 5.1 mEq/L   Chloride 101  96 - 112 mEq/L   CO2 24  19 - 32 mEq/L   Glucose, Bld 92  70 - 99 mg/dL   BUN 9  6 - 23 mg/dL   Creatinine, Ser 7.82  0.50 - 1.10 mg/dL   Calcium 9.0  8.4 - 95.6 mg/dL   Total Protein 6.9  6.0 - 8.3 g/dL   Albumin 3.6  3.5 - 5.2 g/dL   AST 15  0 - 37 U/L   ALT 24  0 - 35 U/L   Alkaline Phosphatase 53  39 - 117 U/L   Total Bilirubin 0.9  0.3 - 1.2 mg/dL   GFR calc non Af Amer 90 (*) >90 mL/min   GFR calc Af Amer >90  >90 mL/min  WET PREP, GENITAL     Status: Abnormal   Collection Time    09/26/13 11:35 AM      Result Value Range   Yeast Wet Prep HPF POC  NONE SEEN  NONE SEEN   Trich, Wet Prep NONE SEEN  NONE SEEN   Clue Cells Wet Prep HPF POC MODERATE (*) NONE SEEN   WBC, Wet Prep HPF POC FEW (*) NONE SEEN   Ct Abdomen Pelvis W Contrast  09/26/2013   CLINICAL DATA:  Right lower quadrant pain  EXAM: CT ABDOMEN AND PELVIS WITH CONTRAST  TECHNIQUE: Multidetector CT imaging of the abdomen and pelvis was performed using the standard protocol following bolus administration of intravenous contrast.  CONTRAST:  OMNIPAQUE IOHEXOL 300 MG/ML SOLN, 1 OMNIPAQUE IOHEXOL 300 MG/ML SOLN  COMPARISON:  None.  FINDINGS: Lung bases are clear.  Liver, spleen, pancreas, and adrenal glands are within normal limits.  Gallbladder is unremarkable. No intrahepatic or extrahepatic ductal dilatation.  Kidneys are within normal limits.  No hydronephrosis.  No evidence of bowel obstruction. Normal appendix. Moderate colonic stool burden.  No evidence of abdominal aortic aneurysm.  Trace pelvic ascites.  Uterus and bilateral ovaries are grossly unremarkable by CT.  Bladder is within normal limits.  Visualized osseous structures are within normal limits.  IMPRESSION: Normal appendix.  No evidence of bowel obstruction.  Moderate colonic stool burden, raising the possibility of constipation.   Electronically Signed   By: Charline Bills M.D.   On: 09/26/2013 15:33    Review of Systems  Constitutional: Negative for fever and chills.  Gastrointestinal: Positive for nausea, vomiting and abdominal pain. Negative for diarrhea and constipation.  Genitourinary: Negative for dysuria, urgency, frequency and hematuria.       No vaginal discharge. No vaginal bleeding. No dysuria.   Musculoskeletal: Positive for back pain.  Neurological:  Negative for headaches.   Physical Exam   Blood pressure 126/58, pulse 88, temperature 98.3 F (36.8 C), temperature source Oral, resp. rate 18, last menstrual period 08/13/2013.  Physical Exam  Constitutional: She is oriented to person, place,  and time. She appears well-developed and well-nourished. She appears distressed.  HENT:  Head: Normocephalic.  Eyes: Pupils are equal, round, and reactive to light.  Neck: Neck supple.  Respiratory: Effort normal.  GI: Soft. Normal appearance. There is tenderness in the right lower quadrant and suprapubic area. There is rigidity and guarding. There is no rebound, no CVA tenderness, no tenderness at McBurney's point and negative Murphy's sign.  Genitourinary: Vaginal discharge found.  Speculum exam: Vagina - Small amount of creamy discharge, mild odor Cervix - No contact bleeding Bimanual exam: Cervix closed. + CMT  Uterus non tender, normal size Adnexa non tender, no masses bilaterally GC/Chlam, wet prep done Chaperone present for exam.   Musculoskeletal: Normal range of motion.  Neurological: She is alert and oriented to person, place, and time.  Skin: Skin is warm. She is not diaphoretic.  Psychiatric: Her behavior is normal.    MAU Course  Procedures None  MDM CT of abdomen with contrast  Consulted with Dr. Erin Blackwell  12:15 pt rates her pain 4/10 following dilaudid 12:40 pt rates her pain 8/10, addition dose of dilaudid ordered. Pt is awaiting a CT scan  1515: Pt returned from CT scan; awaiting results.  1545: Normal CT, normal appendix. Consulted with Dr. Erin Blackwell  Assessment and Plan   A: Constipation Bacterial vaginosis   P: Discharge home Pt is to follow up with her primary care Dr.  Malena Blackwell: Flagyl        Miralalax: mix with Gatorade Return to MAU as needed, if symptoms worsen    Stacy Hansen Rasch, NP 09/26/2013, 8:03 PM

## 2013-09-26 NOTE — MAU Note (Signed)
32 yo, G1P1, presents to MAU with c/o RLQ pain x 3 days; reports progressively worsening today. Tried laxative 1 hour ago; normal BM this morning. Denies sick contacts. Has appendix. Nausea/Vomiting - one emesis occurrence this morning.  Patient standing at bedside leaning on forearms; pain relieved in this position. Aggravated by car ride over; sitting makes it worse.

## 2013-09-27 LAB — GC/CHLAMYDIA PROBE AMP
CT Probe RNA: NEGATIVE
GC Probe RNA: NEGATIVE

## 2013-10-06 ENCOUNTER — Encounter (HOSPITAL_COMMUNITY): Payer: Self-pay | Admitting: Emergency Medicine

## 2013-10-06 ENCOUNTER — Emergency Department (INDEPENDENT_AMBULATORY_CARE_PROVIDER_SITE_OTHER)
Admission: EM | Admit: 2013-10-06 | Discharge: 2013-10-06 | Disposition: A | Discharge status: Home / Self Care | Payer: Medicare Other | Source: Home / Self Care | Attending: Family Medicine | Admitting: Family Medicine

## 2013-10-06 DIAGNOSIS — N644 Mastodynia: Secondary | ICD-10-CM | POA: Diagnosis not present

## 2013-10-06 LAB — POCT URINALYSIS DIP (DEVICE)
Ketones, ur: NEGATIVE mg/dL
Leukocytes, UA: NEGATIVE
Nitrite: NEGATIVE
Protein, ur: NEGATIVE mg/dL
Urobilinogen, UA: 1 mg/dL (ref 0.0–1.0)
pH: 7 (ref 5.0–8.0)

## 2013-10-06 LAB — POCT PREGNANCY, URINE: Preg Test, Ur: NEGATIVE

## 2013-10-06 MED ORDER — IBUPROFEN 600 MG PO TABS: 600.0000 mg | ORAL_TABLET | Freq: Three times a day (TID) | ORAL | Status: DC | PRN

## 2013-10-06 NOTE — ED Notes (Signed)
Patient c/o of severe pain in her breast for 1 week. 7/10. States she has tried cold and heat packs and Tylenol with no relief. Denies any discharge from breast. Written by: Marga Melnick

## 2013-10-06 NOTE — ED Provider Notes (Signed)
CSN: [id number]     Arrival date & time 10/06/13  0945 History   First MD Initiated Contact with Patient 10/06/13 1108     Chief Complaint  Patient presents with  . Breast Pain   (Consider location/radiation/quality/duration/timing/severity/associated sxs/prior Treatment) HPI Comments: Patient presents for evaluation of 1 week of bilateral breast pain without reports of recent illness or injury. Denies previous episodes. Denies skin changes, fever or nipple discharge.   The history is provided by the patient.    Past Medical History  Diagnosis Date  . Asthma   . Vertigo   . Acid reflux   . Obesity   . Depression     doing good  . Ovarian cyst    Past Surgical History  Procedure Laterality Date  . Cystectomy     Family History  Problem Relation Age of Onset  . Asthma Mother   . Diabetes Mother   . Heart disease Mother     CHF  . Asthma Sister   . Diabetes Sister   . Heart disease Sister   . Hearing loss Neg Hx    History  Substance Use Topics  . Smoking status: Current Every Day Smoker -- 0.25 packs/day for 10 years    Types: Cigarettes  . Smokeless tobacco: Never Used  . Alcohol Use: No   OB History   Grav Para Term Preterm Abortions TAB SAB Ect Mult Living   1 1 1  0 0 0 0 0 0 1     Review of Systems  All other systems reviewed and are negative.    Allergies  Amoxicillin  Home Medications   Current Outpatient Rx  Name  Route  Sig  Dispense  Refill  . albuterol (PROVENTIL HFA;VENTOLIN HFA) 108 (90 BASE) MCG/ACT inhaler   Inhalation   Inhale 2 puffs into the lungs every 6 (six) hours as needed for wheezing or shortness of breath.         Marland Kitchen albuterol (PROVENTIL) (5 MG/ML) 0.5% nebulizer solution   Nebulization   Take 2.5 mg by nebulization every 6 (six) hours as needed for wheezing or shortness of breath.         . docusate sodium (COLACE) 100 MG capsule   Oral   Take 100 mg by mouth 2 (two) times daily.         Marland Kitchen ibuprofen (ADVIL,MOTRIN)  600 MG tablet   Oral   Take 1 tablet (600 mg total) by mouth every 8 (eight) hours as needed. For pain   30 tablet   0   . metroNIDAZOLE (FLAGYL) 500 MG tablet   Oral   Take 1 tablet (500 mg total) by mouth 2 (two) times daily.   14 tablet   0   . polyethylene glycol (MIRALAX / GLYCOLAX) packet   Oral   Take 17 g by mouth daily.   14 each   0    BP 138/57  Pulse 82  Temp(Src) 97.9 F (36.6 C) (Oral)  Resp 20  SpO2 99%  LMP 09/13/2013 Physical Exam  Constitutional: She is oriented to person, place, and time. She appears well-developed and well-nourished. No distress.  +obese  HENT:  Head: Normocephalic and atraumatic.  Neck: Normal range of motion. Neck supple.  Cardiovascular: Normal rate, regular rhythm and normal heart sounds.   Pulmonary/Chest: Effort normal and breath sounds normal. Right breast exhibits tenderness. Right breast exhibits no inverted nipple, no mass, no nipple discharge and no skin change. Left breast exhibits tenderness. Left  breast exhibits no inverted nipple, no mass, no nipple discharge and no skin change. Breasts are symmetrical.  Patient has very large pendulous breasts. No axillary lymphadenopathy. Reports tenderness to palpation over entire surface of both breasts.   Abdominal: There is no tenderness.  Neurological: She is alert and oriented to person, place, and time.  Skin: Skin is warm and dry. No rash noted. No erythema. No pallor.  Psychiatric: She has a normal mood and affect. Her behavior is normal.    ED Course  Procedures (including critical care time) Labs Review Labs Reviewed  POCT URINALYSIS DIP (DEVICE)  POCT PREGNANCY, URINE   Imaging Review No results found.  EKG Interpretation    Date/Time:    Ventricular Rate:    PR Interval:    QRS Duration:   QT Interval:    QTC Calculation:   R Axis:     Text Interpretation:              MDM   exam was without worrisome finding, however,  needs to follow up with  primary care provider for re-examination and to have either a breast ultrasound or mammogram scheduled, especially if symptoms persist. Monitor skin over breast area and should any rash develop, please have herself re-evaluated. urine studies, including your pregnancy test, were negative.  use ibuprofen as prescribed for discomfort.  may continue to use either warm or cold compresses if they provide some relief. Should symptoms become suddenly worse or severe, report to  nearest ER for assistance    Jess Barters [place], [place] 10/06/13 1259

## 2013-10-07 NOTE — ED Provider Notes (Signed)
Medical screening examination/treatment/procedure(s) were performed by a resident physician or non-physician practitioner and as the supervising physician I was immediately available for consultation/collaboration.  Evan Corey, MD    Evan S Corey, MD 10/07/13 0742 

## 2013-10-08 ENCOUNTER — Other Ambulatory Visit: Payer: Self-pay | Admitting: Internal Medicine

## 2013-10-08 DIAGNOSIS — N644 Mastodynia: Secondary | ICD-10-CM

## 2013-10-10 IMAGING — CR DG CHEST 2V
2 series · 2 of 2 positions shown · non-contrast
Comparison: PA and lateral chest 02/05/2013 and 11/02/2011.

CLINICAL DATA: Chest pain and shortness of breath.

CHEST - 2 VIEW

[w chest pa]
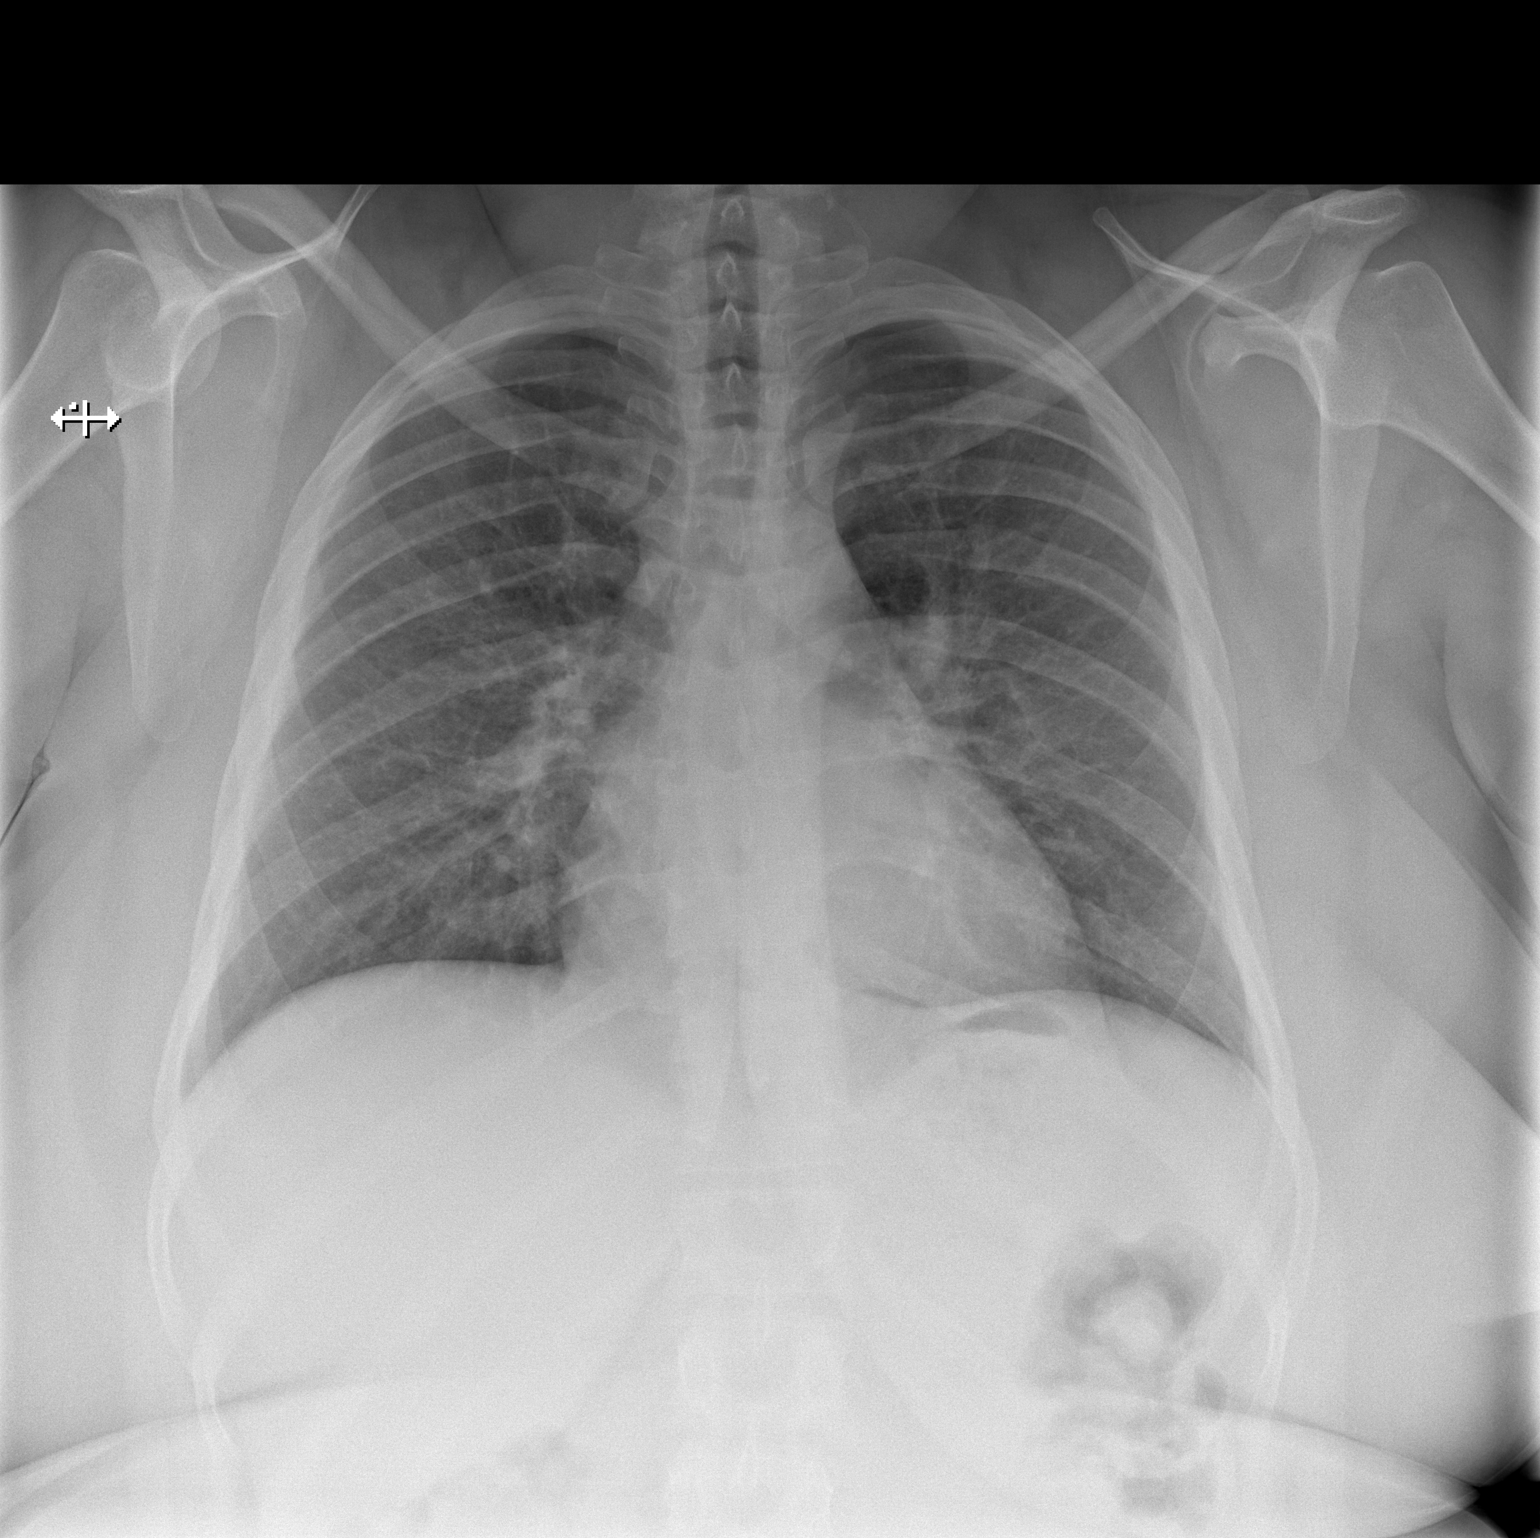

[w chest lat]
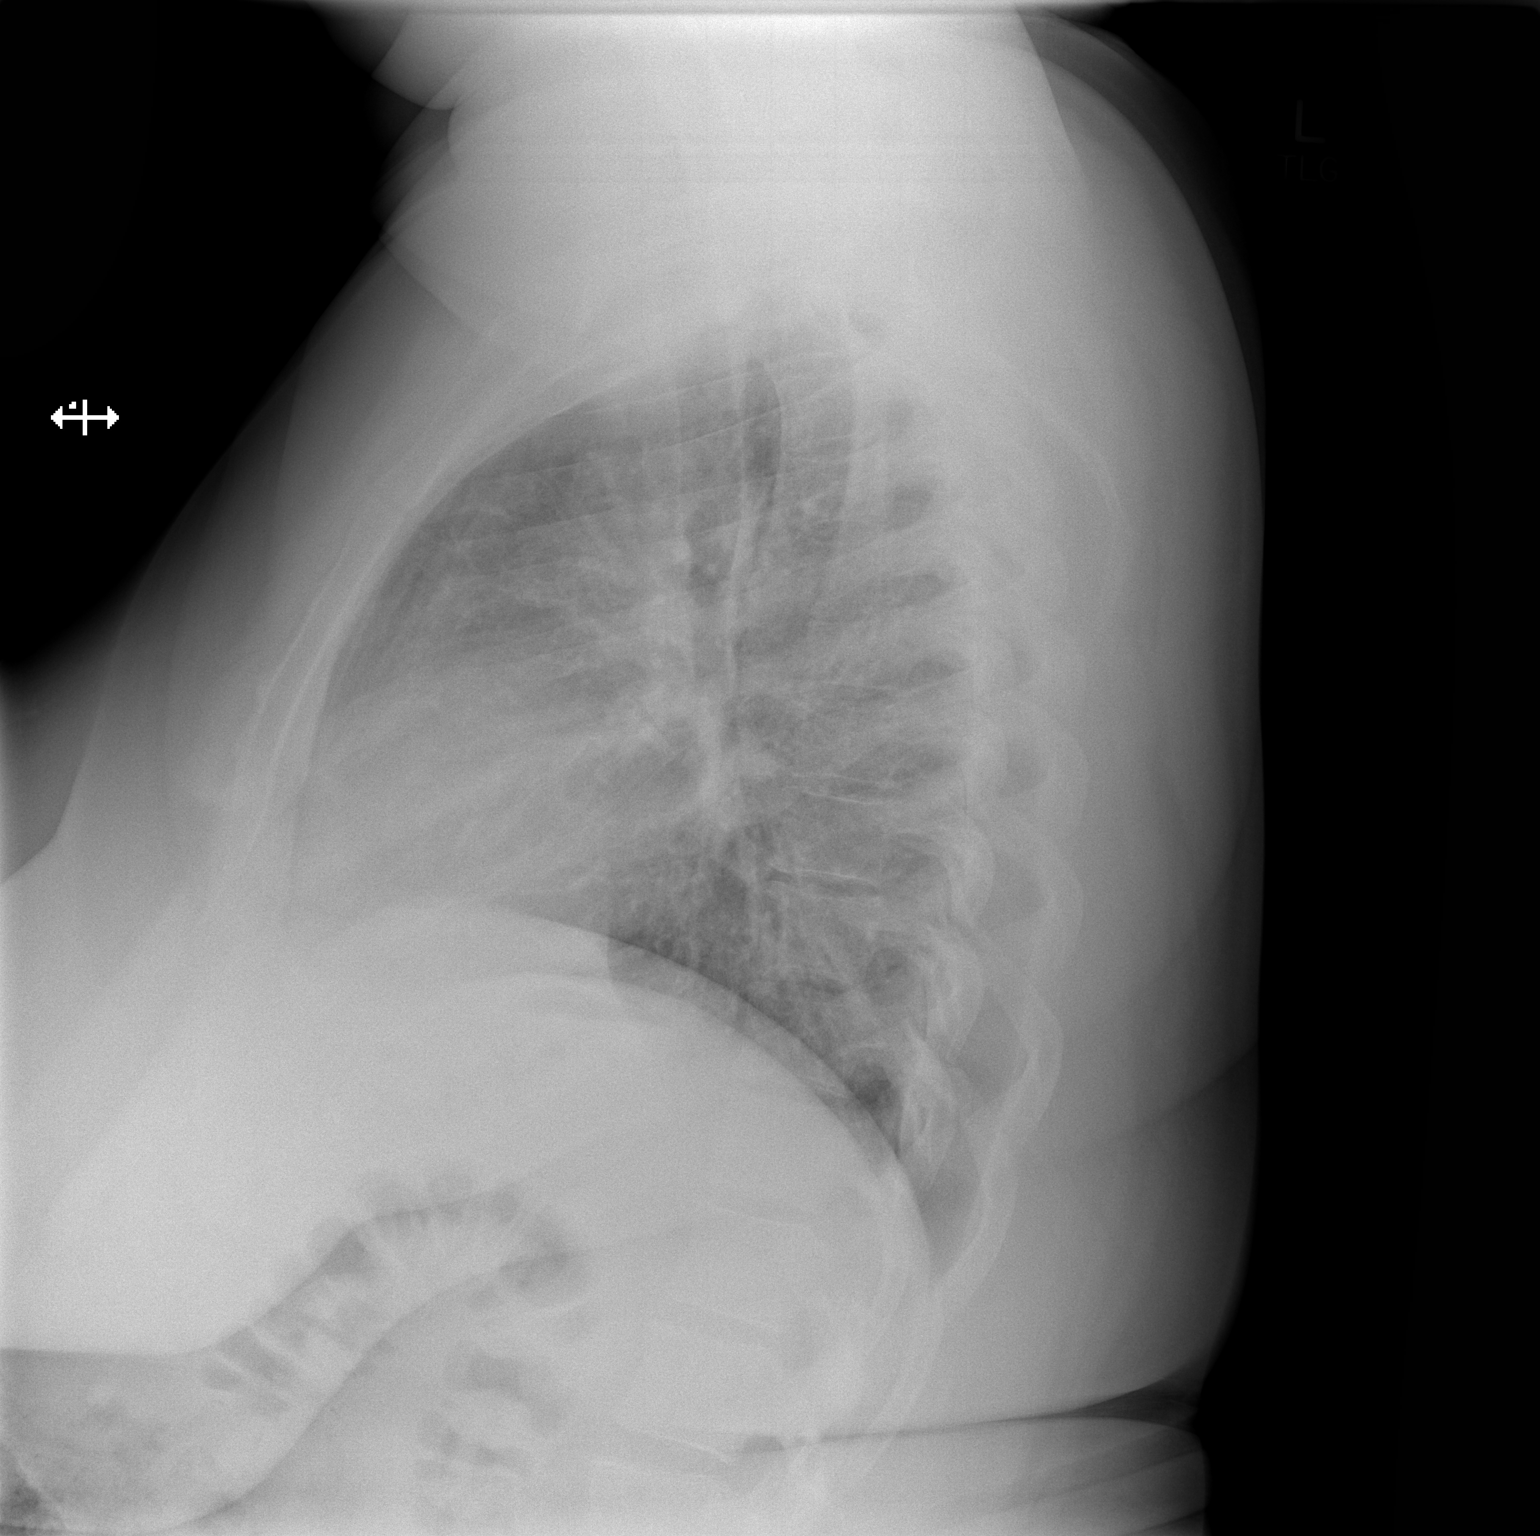

[2 of 2 positions shown; findings below may reference images not displayed]

FINDINGS: Lungs are clear.  Heart size is normal.  No pneumothorax
or pleural fluid.
IMPRESSION: No acute disease.

## 2013-10-20 ENCOUNTER — Ambulatory Visit
Admission: RE | Admit: 2013-10-20 | Discharge: 2013-10-20 | Disposition: A | Discharge status: Home / Self Care | Payer: Medicare Other | Source: Ambulatory Visit | Attending: Internal Medicine | Admitting: Internal Medicine

## 2013-10-20 DIAGNOSIS — N644 Mastodynia: Secondary | ICD-10-CM | POA: Diagnosis not present

## 2013-10-26 ENCOUNTER — Encounter (HOSPITAL_COMMUNITY): Payer: Self-pay | Admitting: Emergency Medicine

## 2013-10-26 ENCOUNTER — Emergency Department (HOSPITAL_COMMUNITY)
Admission: EM | Admit: 2013-10-26 | Discharge: 2013-10-26 | Disposition: A | Discharge status: Home / Self Care | Payer: Medicare Other | Attending: Emergency Medicine | Admitting: Emergency Medicine

## 2013-10-26 ENCOUNTER — Emergency Department (HOSPITAL_COMMUNITY): Payer: Medicare Other

## 2013-10-26 DIAGNOSIS — Z8659 Personal history of other mental and behavioral disorders: Secondary | ICD-10-CM | POA: Diagnosis not present

## 2013-10-26 DIAGNOSIS — Z79899 Other long term (current) drug therapy: Secondary | ICD-10-CM | POA: Diagnosis not present

## 2013-10-26 DIAGNOSIS — Z8719 Personal history of other diseases of the digestive system: Secondary | ICD-10-CM | POA: Insufficient documentation

## 2013-10-26 DIAGNOSIS — F17200 Nicotine dependence, unspecified, uncomplicated: Secondary | ICD-10-CM | POA: Insufficient documentation

## 2013-10-26 DIAGNOSIS — J45909 Unspecified asthma, uncomplicated: Secondary | ICD-10-CM | POA: Diagnosis not present

## 2013-10-26 DIAGNOSIS — R12 Heartburn: Secondary | ICD-10-CM | POA: Insufficient documentation

## 2013-10-26 DIAGNOSIS — Z8742 Personal history of other diseases of the female genital tract: Secondary | ICD-10-CM | POA: Diagnosis not present

## 2013-10-26 DIAGNOSIS — R072 Precordial pain: Secondary | ICD-10-CM | POA: Diagnosis not present

## 2013-10-26 DIAGNOSIS — E669 Obesity, unspecified: Secondary | ICD-10-CM | POA: Insufficient documentation

## 2013-10-26 DIAGNOSIS — R079 Chest pain, unspecified: Secondary | ICD-10-CM | POA: Diagnosis not present

## 2013-10-26 NOTE — ED Provider Notes (Signed)
CSN: [id number]     Arrival date & time 10/26/13  1555 History   First MD Initiated Contact with Patient 10/26/13 2038     Chief Complaint  Patient presents with  . Chest Pain   Patient is a 33 y.o. female presenting with chest pain. The history is provided by the patient.  Chest Pain Pain location:  Substernal area Pain quality: dull   Pain radiates to:  Does not radiate Pain radiates to the back: no   Pain severity:  Mild Onset quality:  Gradual Duration:  1 day Timing:  Constant Progression:  Unchanged Chronicity:  New Relieved by:  None tried Worsened by:  Nothing tried Associated symptoms: heartburn   Associated symptoms: no abdominal pain, no cough, no lower extremity edema, no shortness of breath, no syncope, not vomiting and no weakness   Risk factors: smoking   Risk factors: no birth control and no prior DVT/PE    Pt presents for CP that started 1 day ago It is substernal, she feels there is a "knot" there No sob/weakness She reports mild heartburn symptoms No h/o DVT/PE No new LE edema  Past Medical History  Diagnosis Date  . Asthma   . Vertigo   . Acid reflux   . Obesity   . Depression     doing good  . Ovarian cyst    Past Surgical History  Procedure Laterality Date  . Cystectomy     Family History  Problem Relation Age of Onset  . Asthma Mother   . Diabetes Mother   . Heart disease Mother     CHF  . Asthma Sister   . Diabetes Sister   . Heart disease Sister   . Hearing loss Neg Hx    History  Substance Use Topics  . Smoking status: Current Every Day Smoker -- 0.25 packs/day for 10 years    Types: Cigarettes  . Smokeless tobacco: Never Used  . Alcohol Use: No   OB History   Grav Para Term Preterm Abortions TAB SAB Ect Mult Living   1 1 1  0 0 0 0 0 0 1     Review of Systems  Respiratory: Negative for cough and shortness of breath.   Cardiovascular: Positive for chest pain. Negative for syncope.  Gastrointestinal: Positive for  heartburn. Negative for vomiting and abdominal pain.  Neurological: Negative for weakness.  All other systems reviewed and are negative.    Allergies  Amoxicillin  Home Medications   Current Outpatient Rx  Name  Route  Sig  Dispense  Refill  . albuterol (PROVENTIL HFA;VENTOLIN HFA) 108 (90 BASE) MCG/ACT inhaler   Inhalation   Inhale 2 puffs into the lungs every 6 (six) hours as needed for wheezing or shortness of breath.         Marland Kitchen. albuterol (PROVENTIL) (5 MG/ML) 0.5% nebulizer solution   Nebulization   Take 2.5 mg by nebulization every 6 (six) hours as needed for wheezing or shortness of breath.         . docusate sodium (COLACE) 100 MG capsule   Oral   Take 100 mg by mouth daily as needed for mild constipation.           BP 132/78  Pulse 91  Temp(Src) 99 F (37.2 C) (Oral)  Resp 20  SpO2 100%  LMP 10/13/2013 Physical Exam CONSTITUTIONAL: Well developed/well nourished HEAD: Normocephalic/atraumatic EYES: EOMI/PERRL ENMT: Mucous membranes moist NECK: supple no meningeal signs CV: S1/S2 noted, no murmurs/rubs/gallops noted Chest -  mild tenderness to xiphoid process.  No bruising/crepitance noted.   LUNGS: Lungs are clear to auscultation bilaterally, no apparent distress ABDOMEN: soft, nontender, no rebound or guarding GU:no cva tenderness NEURO: Pt is awake/alert, moves all extremitiesx4 EXTREMITIES: pulses normal, full ROM, no calf tenderness or edema noted SKIN: warm, color normal PSYCH: no abnormalities of mood noted  ED Course  Procedures (including critical care time) Labs Review Labs Reviewed - No data to display Imaging Review Dg Chest 2 View  10/26/2013   CLINICAL DATA:  Chest pain.  Smoker.  EXAM: CHEST  2 VIEW  COMPARISON:  09/10/2013.  FINDINGS: The heart size and mediastinal contours are within normal limits. Both lungs are clear. The visualized skeletal structures are unremarkable.  IMPRESSION: No active cardiopulmonary disease.   Electronically  Signed   By: Loralie Champagne M.D.   On: 10/26/2013 17:19    EKG Interpretation    Date/Time:  Tuesday October 26 2013 16:02:38 EST Ventricular Rate:  85 PR Interval:  132 QRS Duration: 82 QT Interval:  358 QTC Calculation: 426 R Axis:   72 Text Interpretation:  Normal sinus rhythm with sinus arrhythmia Normal ECG Confirmed by Bebe Shaggy  MD, Fermon Ureta [id number]) on 10/26/2013 8:38:52 PM            MDM   1. Chest pain    Nursing notes including past medical history and social history reviewed and considered in documentation xrays reviewed and considered  Pt well appearing, no distress, reproducible CP, I doubt ACS/Dissection, low risk for PE and appears PERC negative Stable for d/c home    Joya Gaskins, MD 10/26/13 2229

## 2013-10-26 NOTE — Discharge Instructions (Signed)

## 2013-10-26 NOTE — ED Notes (Signed)
Pt in c/o chest pain to epigastric area, pt states she has a knot in this area and that is the source of her pain, denies shortness of breath or other symptoms, states area is tender to palpation

## 2013-11-19 ENCOUNTER — Inpatient Hospital Stay (HOSPITAL_COMMUNITY)
Admission: AD | Admit: 2013-11-19 | Discharge: 2013-11-19 | Disposition: A | Discharge status: Home / Self Care | Payer: Medicare Other | Source: Ambulatory Visit | Attending: Obstetrics & Gynecology | Admitting: Obstetrics & Gynecology

## 2013-11-19 ENCOUNTER — Encounter (HOSPITAL_COMMUNITY): Payer: Self-pay

## 2013-11-19 DIAGNOSIS — N760 Acute vaginitis: Secondary | ICD-10-CM | POA: Diagnosis not present

## 2013-11-19 DIAGNOSIS — F17200 Nicotine dependence, unspecified, uncomplicated: Secondary | ICD-10-CM | POA: Diagnosis not present

## 2013-11-19 DIAGNOSIS — A499 Bacterial infection, unspecified: Secondary | ICD-10-CM | POA: Insufficient documentation

## 2013-11-19 DIAGNOSIS — Z202 Contact with and (suspected) exposure to infections with a predominantly sexual mode of transmission: Secondary | ICD-10-CM | POA: Insufficient documentation

## 2013-11-19 DIAGNOSIS — R109 Unspecified abdominal pain: Secondary | ICD-10-CM | POA: Diagnosis not present

## 2013-11-19 DIAGNOSIS — B9689 Other specified bacterial agents as the cause of diseases classified elsewhere: Secondary | ICD-10-CM | POA: Insufficient documentation

## 2013-11-19 LAB — WET PREP, GENITAL
Trich, Wet Prep: NONE SEEN
YEAST WET PREP: NONE SEEN

## 2013-11-19 LAB — URINALYSIS, ROUTINE W REFLEX MICROSCOPIC
GLUCOSE, UA: NEGATIVE mg/dL
HGB URINE DIPSTICK: NEGATIVE
Ketones, ur: 15 mg/dL — AB
LEUKOCYTES UA: NEGATIVE
Nitrite: NEGATIVE
PH: 6 (ref 5.0–8.0)
Protein, ur: NEGATIVE mg/dL
Urobilinogen, UA: 0.2 mg/dL (ref 0.0–1.0)

## 2013-11-19 LAB — POCT PREGNANCY, URINE: Preg Test, Ur: NEGATIVE

## 2013-11-19 MED ORDER — ONDANSETRON 8 MG PO TBDP
8.0000 mg | ORAL_TABLET | ORAL | Status: AC
  Administered 2013-11-19: 8 mg via ORAL
  Filled 2013-11-19: qty 1

## 2013-11-19 MED ORDER — METRONIDAZOLE 500 MG PO TABS: 500.0000 mg | ORAL_TABLET | Freq: Two times a day (BID) | ORAL | Status: DC

## 2013-11-19 MED ORDER — METRONIDAZOLE 500 MG PO TABS
2000.0000 mg | ORAL_TABLET | Freq: Once | ORAL | Status: AC
  Administered 2013-11-19: 2000 mg via ORAL
  Filled 2013-11-19: qty 4

## 2013-11-19 NOTE — MAU Note (Signed)
Intercourse was 1 wk ago

## 2013-11-19 NOTE — MAU Provider Note (Signed)
Chief Complaint: Vaginal Discharge and Abdominal Cramping   None    SUBJECTIVE HPI: Stacy BeechamStephanie R Blackwell is a 33 y.o. G1P1001 who presents to maternity admissions reporting her recent sexual partner informed her that he has trichomonas.  She reports she has frequent bacterial vaginosis but has had no change in discharge or itching.  She denies vaginal bleeding, urinary symptoms, h/a, dizziness, n/v, or fever/chills.     Past Medical History  Diagnosis Date  . Asthma   . Vertigo   . Acid reflux   . Obesity   . Depression     doing good  . Ovarian cyst    Past Surgical History  Procedure Laterality Date  . Cystectomy     History   Social History  . Marital Status: Single    Spouse Name: N/A    Number of Children: N/A  . Years of Education: N/A   Occupational History  . Not on file.   Social History Main Topics  . Smoking status: Current Every Day Smoker -- 0.25 packs/day for 10 years    Types: Cigarettes  . Smokeless tobacco: Never Used  . Alcohol Use: No  . Drug Use: No  . Sexual Activity: Yes    Birth Control/ Protection: None   Other Topics Concern  . Not on file   Social History Narrative  . No narrative on file   No current facility-administered medications on file prior to encounter.   Current Outpatient Prescriptions on File Prior to Encounter  Medication Sig Dispense Refill  . albuterol (PROVENTIL HFA;VENTOLIN HFA) 108 (90 BASE) MCG/ACT inhaler Inhale 2 puffs into the lungs every 6 (six) hours as needed for wheezing or shortness of breath.      Marland Kitchen. albuterol (PROVENTIL) (5 MG/ML) 0.5% nebulizer solution Take 2.5 mg by nebulization every 6 (six) hours as needed for wheezing or shortness of breath.       Allergies  Allergen Reactions  . Amoxicillin Hives and Swelling    Swelling in the eyes    ROS: Pertinent items in HPI  OBJECTIVE Blood pressure 128/57, pulse 80, temperature 98 F (36.7 C), temperature source Oral, resp. rate 18, height 5\' 2"  (1.575  m), weight 127.914 kg (282 lb), last menstrual period 10/13/2013. GENERAL: Well-developed, well-nourished female in no acute distress.  HEENT: Normocephalic HEART: normal rate RESP: normal effort ABDOMEN: Soft, non-tender EXTREMITIES: Nontender, no edema NEURO: Alert and oriented Pelvic exam: Cervix pink, visually closed, without lesion, scant white creamy discharge, vaginal walls and external genitalia normal   LAB RESULTS Results for orders placed during the hospital encounter of 11/19/13 (from the past 24 hour(s))  URINALYSIS, ROUTINE W REFLEX MICROSCOPIC     Status: Abnormal   Collection Time    11/19/13 12:04 PM      Result Value Range   Color, Urine YELLOW  YELLOW   APPearance CLEAR  CLEAR   Specific Gravity, Urine >1.030 (*) 1.005 - 1.030   pH 6.0  5.0 - 8.0   Glucose, UA NEGATIVE  NEGATIVE mg/dL   Hgb urine dipstick NEGATIVE  NEGATIVE   Bilirubin Urine SMALL (*) NEGATIVE   Ketones, ur 15 (*) NEGATIVE mg/dL   Protein, ur NEGATIVE  NEGATIVE mg/dL   Urobilinogen, UA 0.2  0.0 - 1.0 mg/dL   Nitrite NEGATIVE  NEGATIVE   Leukocytes, UA NEGATIVE  NEGATIVE  POCT PREGNANCY, URINE     Status: None   Collection Time    11/19/13 12:07 PM      Result  Value Range   Preg Test, Ur NEGATIVE  NEGATIVE  WET PREP, GENITAL     Status: Abnormal   Collection Time    11/19/13  1:35 PM      Result Value Range   Yeast Wet Prep HPF POC NONE SEEN  NONE SEEN   Trich, Wet Prep NONE SEEN  NONE SEEN   Clue Cells Wet Prep HPF POC MODERATE (*) NONE SEEN   WBC, Wet Prep HPF POC FEW (*) NONE SEEN     ASSESSMENT 1. Exposure to STD   2. BV (bacterial vaginosis)     PLAN Flagyl 2000 mg x1 dose with Zofran 8 mg ODT in MAU Discharge home [facility] pending Flagyl 500 mg BID x 7 days with 1 refill Recommend probiotic, including yogurt daily, no perfumes, soaps, douches to prevent BV F/U with gyn provider Return to MAU as needed    Medication List         albuterol 108 (90 BASE) MCG/ACT inhaler   Commonly known as:  PROVENTIL HFA;VENTOLIN HFA  Inhale 2 puffs into the lungs every 6 (six) hours as needed for wheezing or shortness of breath.     albuterol (5 MG/ML) 0.5% nebulizer solution  Commonly known as:  PROVENTIL  Take 2.5 mg by nebulization every 6 (six) hours as needed for wheezing or shortness of breath.     calcium carbonate 500 MG chewable tablet  Commonly known as:  TUMS - dosed in mg elemental calcium  Chew 1 tablet by mouth daily as needed for indigestion or heartburn.     metroNIDAZOLE 500 MG tablet  Commonly known as:  FLAGYL  Take 1 tablet (500 mg total) by mouth 2 (two) times daily.     polyethylene glycol packet  Commonly known as:  MIRALAX / GLYCOLAX  Take 17 g by mouth daily as needed for mild constipation.       Follow-up Information   Schedule an appointment as soon as possible for a visit with [facility]   Specialty:  Obstetrics and Gynecology   Contact information:   [address] [place] [address] [phone]      Please follow up. (Return to MAU  as needed)       Sharen Counter Certified Nurse-Midwife 11/19/2013  3:24 PM

## 2013-11-19 NOTE — MAU Provider Note (Signed)
Attestation of Attending Supervision of Advanced Practitioner (CNM/NP): Evaluation and management procedures were performed by the Advanced Practitioner under my supervision and collaboration. I have reviewed the Advanced Practitioner's note and chart, and I agree with the management and plan.  Shaune Westfall H. 5:06 PM

## 2013-11-19 NOTE — MAU Note (Signed)
Pt states is here for eval of abdominal cramping that is mild and intermittent. Pt's one time partner told her he has an STD, did not specify which one, and she is here for eval of that as well. Notes vaginal odor however no discharge is noted.

## 2013-11-19 NOTE — Discharge Instructions (Signed)
Trichomoniasis Trichomoniasis is an infection, caused by the Trichomonas organism, that affects both women and men. In women, the outer female genitalia and the vagina are affected. In men, the penis is mainly affected, but the prostate and other reproductive organs can also be involved. Trichomoniasis is a sexually transmitted disease (STD) and is most often passed to another person through sexual contact. The majority of people who get trichomoniasis do so from a sexual encounter and are also at risk for other STDs. CAUSES   Sexual intercourse with an infected partner.  It can be present in swimming pools or hot tubs. SYMPTOMS   Abnormal gray-green frothy vaginal discharge in women.  Vaginal itching and irritation in women.  Itching and irritation of the area outside the vagina in women.  Penile discharge with or without pain in males.  Inflammation of the urethra (urethritis), causing painful urination.  Bleeding after sexual intercourse. RELATED COMPLICATIONS  Pelvic inflammatory disease.  Infection of the uterus (endometritis).  Infertility.  Tubal (ectopic) pregnancy.  It can be associated with other STDs, including gonorrhea and chlamydia, hepatitis B, and HIV. COMPLICATIONS DURING PREGNANCY  Early (premature) delivery.  Premature rupture of the membranes (PROM).  Low birth weight. DIAGNOSIS   Visualization of Trichomonas under the microscope from the vagina discharge.  Ph of the vagina greater than 4.5, tested with a test tape.  Trich Rapid Test.  Culture of the organism, but this is not usually needed.  It may be found on a Pap test.  Having a "strawberry cervix,"which means the cervix looks very red like a strawberry. TREATMENT   You may be given medication to fight the infection. Inform your caregiver if you could be or are pregnant. Some medications used to treat the infection should not be taken during pregnancy.  Over-the-counter medications or  creams to decrease itching or irritation may be recommended.  Your sexual partner will need to be treated if infected. HOME CARE INSTRUCTIONS   Take all medication prescribed by your caregiver.  Take over-the-counter medication for itching or irritation as directed by your caregiver.  Do not have sexual intercourse while you have the infection.  Do not douche or wear tampons.  Discuss your infection with your partner, as your partner may have acquired the infection from you. Or, your partner may have been the person who transmitted the infection to you.  Have your sex partner examined and treated if necessary.  Practice safe, informed, and protected sex.  See your caregiver for other STD testing. SEEK MEDICAL CARE IF:   You still have symptoms after you finish the medication.  You have an oral temperature above 102 F (38.9 C).  You develop belly (abdominal) pain.  You have pain when you urinate.  You have bleeding after sexual intercourse.  You develop a rash.  The medication makes you sick or makes you throw up (vomit). Document Released: 04/02/2001 Document Revised: 12/30/2011 Document Reviewed: 04/28/2009 [facility] Patient Information 2014 [place], [place]. Sexually Transmitted Disease A sexually transmitted disease (STD) is a disease or infection that may be passed (transmitted) from person to person, usually during sexual activity. This may happen by way of saliva, semen, blood, vaginal mucus, or urine. Common STDs include:   Gonorrhea.   Chlamydia.   Syphilis.   HIV and AIDS.   Genital herpes.   Hepatitis B and C.   Trichomonas.   Human papillomavirus (HPV).   Pubic lice.   Scabies.  Mites.  Bacterial vaginosis. WHAT ARE CAUSES OF STDs?  An STD may be caused by bacteria, a virus, or parasites. STDs are often transmitted during sexual activity if one person is infected. However, they may also be transmitted through nonsexual means. STDs may  be transmitted after:   Sexual intercourse with an infected person.   Sharing sex toys with an infected person.   Sharing needles with an infected person or using unclean piercing or tattoo needles.  Having intimate contact with the genitals, mouth, or rectal areas of an infected person.   Exposure to infected fluids during birth. WHAT ARE THE SIGNS AND SYMPTOMS OF STDs? Different STDs have different symptoms. Some people may not have any symptoms. If symptoms are present, they may include:   Painful or bloody urination.   Pain in the pelvis, abdomen, vagina, anus, throat, or eyes.   Skin rash, itching, irritation, growths, sores (lesions), ulcerations, or warts in the genital or anal area.  Abnormal vaginal discharge with or without bad odor.   Penile discharge in men.   Fever.   Pain or bleeding during sexual intercourse.   Swollen glands in the groin area.   Yellow skin and eyes (jaundice). This is seen with hepatitis.   Swollen testicles.  Infertility.  Sores and blisters in the mouth. HOW ARE STDs DIAGNOSED? To make a diagnosis, your health care provider may:   Take a medical history.   Perform a physical exam.   Take a sample of any discharge for examination.  Swab the throat, cervix, opening to the penis, rectum, or vagina for testing.  Test a sample of your first morning urine.   Perform blood tests.   Perform a Pap smear, if this applies.   Perform a colposcopy.   Perform a laparoscopy.  HOW ARE STDs TREATED? Treatment depends on the STD. Some STDs may be treated but not cured.   Chlamydia, gonorrhea, trichomonas, and syphilis can be cured with antibiotics.   Genital herpes, hepatitis, and HIV can be treated, but not cured, with prescribed medicines. The medicines lessen symptoms.   Genital warts from HPV can be treated with medicine or by freezing, burning (electrocautery), or surgery. Warts may come back.   HPV cannot  be cured with medicine or surgery. However, abnormal areas may be removed from the cervix, vagina, or vulva.   If your diagnosis is confirmed, your recent sexual partners need treatment. This is true even if they are symptom-free or have a negative culture or evaluation. They should not have sex until their health care providers say it is OK. HOW CAN I REDUCE MY RISK OF GETTING AN STD?  Use latex condoms, dental dams, and water-soluble lubricants during sexual activity. Do not use petroleum jelly or oils.  Get vaccinated for HPV and hepatitis. If you have not received these vaccines in the past, talk to your health care provider about whether one or both might be right for you.   Avoid risky sex practices that can break the skin.  WHAT SHOULD I DO IF I THINK I HAVE AN STD?  See your health care provider.   Inform all sexual partners. They should be tested and treated for any STDs.  Do not have sex until your health care provider says it is OK. WHEN SHOULD I GET HELP? Seek immediate medical care if:  You develop severe abdominal pain.  You are a man and notice swelling or pain in the testicles.  You are a woman and notice swelling or pain in your vagina. Document Released: 12/28/2002 Document  Revised: 07/28/2013 Document Reviewed: 04/27/2013 [facility] Patient Information 2014 [place], [place].  Bacterial Vaginosis Bacterial vaginosis is a vaginal infection that occurs when the normal balance of bacteria in the vagina is disrupted. It results from an overgrowth of certain bacteria. This is the most common vaginal infection in women of childbearing age. Treatment is important to prevent complications, especially in pregnant women, as it can cause a premature delivery. CAUSES  Bacterial vaginosis is caused by an increase in harmful bacteria that are normally present in smaller amounts in the vagina. Several different kinds of bacteria can cause bacterial vaginosis. However, the reason  that the condition develops is not fully understood. RISK FACTORS Certain activities or behaviors can put you at an increased risk of developing bacterial vaginosis, including:  Having a new sex partner or multiple sex partners.  Douching.  Using an intrauterine device (IUD) for contraception. Women do not get bacterial vaginosis from toilet seats, bedding, swimming pools, or contact with objects around them. SIGNS AND SYMPTOMS  Some women with bacterial vaginosis have no signs or symptoms. Common symptoms include:  Grey vaginal discharge.  A fishlike odor with discharge, especially after sexual intercourse.  Itching or burning of the vagina and vulva.  Burning or pain with urination. DIAGNOSIS  Your health care provider will take a medical history and examine the vagina for signs of bacterial vaginosis. A sample of vaginal fluid may be taken. Your health care provider will look at this sample under a microscope to check for bacteria and abnormal cells. A vaginal pH test may also be done.  TREATMENT  Bacterial vaginosis may be treated with antibiotic medicines. These may be given in the form of a pill or a vaginal cream. A second round of antibiotics may be prescribed if the condition comes back after treatment.  HOME CARE INSTRUCTIONS   Only take over-the-counter or prescription medicines as directed by your health care provider.  If antibiotic medicine was prescribed, take it as directed. Make sure you finish it even if you start to feel better.  Do not have sex until treatment is completed.  Tell all sexual partners that you have a vaginal infection. They should see their health care provider and be treated if they have problems, such as a mild rash or itching.  Practice safe sex by using condoms and only having one sex partner. SEEK MEDICAL CARE IF:   Your symptoms are not improving after 3 days of treatment.  You have increased discharge or pain.  You have a fever. MAKE  SURE YOU:   Understand these instructions.  Will watch your condition.  Will get help right away if you are not doing well or get worse. FOR MORE INFORMATION  Centers for Disease Control and Prevention, Division of STD Prevention: [employer] American Sexual Health Association (ASHA): www.ashastd.org  Document Released: 10/07/2005 Document Revised: 07/28/2013 Document Reviewed: 05/19/2013 [facility] Patient Information 2014 [place], [place].

## 2013-11-20 LAB — GC/CHLAMYDIA PROBE AMP
CT PROBE, AMP APTIMA: NEGATIVE
GC PROBE AMP APTIMA: NEGATIVE

## 2013-11-24 DIAGNOSIS — Z6841 Body Mass Index (BMI) 40.0 and over, adult: Secondary | ICD-10-CM | POA: Diagnosis not present

## 2013-11-24 DIAGNOSIS — J209 Acute bronchitis, unspecified: Secondary | ICD-10-CM | POA: Diagnosis not present

## 2013-11-24 DIAGNOSIS — F17200 Nicotine dependence, unspecified, uncomplicated: Secondary | ICD-10-CM | POA: Diagnosis not present

## 2013-11-24 DIAGNOSIS — J019 Acute sinusitis, unspecified: Secondary | ICD-10-CM | POA: Diagnosis not present

## 2013-12-06 DIAGNOSIS — R252 Cramp and spasm: Secondary | ICD-10-CM | POA: Diagnosis not present

## 2013-12-06 DIAGNOSIS — J3089 Other allergic rhinitis: Secondary | ICD-10-CM | POA: Diagnosis not present

## 2013-12-06 DIAGNOSIS — J45902 Unspecified asthma with status asthmaticus: Secondary | ICD-10-CM | POA: Diagnosis not present

## 2013-12-06 DIAGNOSIS — Z6841 Body Mass Index (BMI) 40.0 and over, adult: Secondary | ICD-10-CM | POA: Diagnosis not present

## 2013-12-06 DIAGNOSIS — E669 Obesity, unspecified: Secondary | ICD-10-CM | POA: Diagnosis not present

## 2014-01-03 DIAGNOSIS — Z6841 Body Mass Index (BMI) 40.0 and over, adult: Secondary | ICD-10-CM | POA: Diagnosis not present

## 2014-01-03 DIAGNOSIS — J45902 Unspecified asthma with status asthmaticus: Secondary | ICD-10-CM | POA: Diagnosis not present

## 2014-01-03 DIAGNOSIS — M25569 Pain in unspecified knee: Secondary | ICD-10-CM | POA: Diagnosis not present

## 2014-01-03 DIAGNOSIS — L02429 Furuncle of limb, unspecified: Secondary | ICD-10-CM | POA: Diagnosis not present

## 2014-01-03 DIAGNOSIS — E669 Obesity, unspecified: Secondary | ICD-10-CM | POA: Diagnosis not present

## 2014-01-21 ENCOUNTER — Encounter (HOSPITAL_COMMUNITY): Payer: Self-pay | Admitting: Emergency Medicine

## 2014-01-21 ENCOUNTER — Emergency Department (HOSPITAL_COMMUNITY)
Admission: EM | Admit: 2014-01-21 | Discharge: 2014-01-21 | Disposition: A | Discharge status: Home / Self Care | Payer: Medicare Other | Attending: Emergency Medicine | Admitting: Emergency Medicine

## 2014-01-21 ENCOUNTER — Emergency Department (HOSPITAL_COMMUNITY): Payer: Medicare Other

## 2014-01-21 DIAGNOSIS — Z881 Allergy status to other antibiotic agents status: Secondary | ICD-10-CM | POA: Diagnosis not present

## 2014-01-21 DIAGNOSIS — F329 Major depressive disorder, single episode, unspecified: Secondary | ICD-10-CM | POA: Diagnosis not present

## 2014-01-21 DIAGNOSIS — N83209 Unspecified ovarian cyst, unspecified side: Secondary | ICD-10-CM | POA: Diagnosis not present

## 2014-01-21 DIAGNOSIS — Z79899 Other long term (current) drug therapy: Secondary | ICD-10-CM | POA: Diagnosis not present

## 2014-01-21 DIAGNOSIS — F17200 Nicotine dependence, unspecified, uncomplicated: Secondary | ICD-10-CM | POA: Diagnosis not present

## 2014-01-21 DIAGNOSIS — R42 Dizziness and giddiness: Secondary | ICD-10-CM | POA: Diagnosis not present

## 2014-01-21 DIAGNOSIS — R209 Unspecified disturbances of skin sensation: Secondary | ICD-10-CM | POA: Insufficient documentation

## 2014-01-21 DIAGNOSIS — E669 Obesity, unspecified: Secondary | ICD-10-CM | POA: Insufficient documentation

## 2014-01-21 DIAGNOSIS — M79646 Pain in unspecified finger(s): Secondary | ICD-10-CM

## 2014-01-21 DIAGNOSIS — K219 Gastro-esophageal reflux disease without esophagitis: Secondary | ICD-10-CM | POA: Insufficient documentation

## 2014-01-21 DIAGNOSIS — M7989 Other specified soft tissue disorders: Secondary | ICD-10-CM | POA: Diagnosis not present

## 2014-01-21 DIAGNOSIS — J45909 Unspecified asthma, uncomplicated: Secondary | ICD-10-CM | POA: Insufficient documentation

## 2014-01-21 DIAGNOSIS — M79609 Pain in unspecified limb: Secondary | ICD-10-CM | POA: Insufficient documentation

## 2014-01-21 DIAGNOSIS — F3289 Other specified depressive episodes: Secondary | ICD-10-CM | POA: Insufficient documentation

## 2014-01-21 MED ORDER — MELOXICAM 7.5 MG PO TABS: 7.5000 mg | ORAL_TABLET | Freq: Every day | ORAL | Status: DC

## 2014-01-21 NOTE — Discharge Instructions (Signed)
Please read and follow all provided instructions.  Your diagnoses today include:  1. Finger pain    Tests performed today include:  An x-ray of the affected area - does NOT show any broken bones  Vital signs. See below for your results today.   Medications prescribed:   Meloxicam - anti-inflammatory pain medication  You have been prescribed an anti-inflammatory medication or NSAID. Take with food. Do not take aspirin, ibuprofen, or naproxen if taking this medication. Take smallest effective dose for the shortest duration needed for your pain. Stop taking if you experience stomach pain or vomiting.   Take any prescribed medications only as directed.  Home care instructions:   Follow any educational materials contained in this packet  Follow R.I.C.E. Protocol:  R - rest your injury   I  - use ice on injury without applying directly to skin  C - compress injury with bandage or splint  E - elevate the injury as much as possible  Follow-up instructions: Please follow-up with your primary care provider if you continue to have significant pain 1 week.   If you do not have a primary care doctor -- see below for referral information.   Return instructions:   Please return if your toes are numb or tingling, appear gray or blue, or you have severe pain (also elevate leg and loosen splint or wrap if you were given one)  Return if your finger becomes more swollen, red, or more painful to move  Please return to the Emergency Department if you experience worsening symptoms.   Please return if you have any other emergent concerns.  Additional Information:  Your vital signs today were: BP 129/83   Pulse 72   Temp(Src) 97.9 F (36.6 C) (Oral)   Resp 18   Ht 5\' 2"  (1.575 m)   Wt 272 lb (123.378 kg)   BMI 49.74 kg/m2   SpO2 100%   LMP 01/01/2014 If your blood pressure (BP) was elevated above 135/85 this visit, please have this repeated by your doctor within one  month. --------------

## 2014-01-21 NOTE — ED Provider Notes (Signed)
CSN: [id number]     Arrival date & time 01/21/14  1043 History   First MD Initiated Contact with Patient 01/21/14 1051    This chart was scribed for Stacy Blackwell, a non-physician practitioner working with Flint Melter, MD by Lewanda Rife, ED Scribe. This patient was seen in room TR08C/TR08C and the patient's care was started at 11:04 AM      Chief Complaint  Patient presents with  . Hand Pain     (Consider location/radiation/quality/duration/timing/severity/associated sxs/prior Treatment) The history is provided by the patient. No language interpreter was used.   HPI Comments:  Stacy Blackwell is a 33 y.o. female who presents to the Emergency Department complaining of constant right index finger pain from MCP joint to proximal interphalangeal joint onset 2 days. Describes pain as gradually worsening in severity. Reports associated mild swelling, and limited active ROM secondary to pain. Reports pain is exacerbated by touch and by movement. Reports trying hydrocodone, and ibuprofen with no relief of symptoms. Denies associated recent injury/cuts, fever, and numbness. Denies significant PMHx.   Past Medical History  Diagnosis Date  . Asthma   . Vertigo   . Acid reflux   . Obesity   . Depression     doing good  . Ovarian cyst    Past Surgical History  Procedure Laterality Date  . Cystectomy     Family History  Problem Relation Age of Onset  . Asthma Mother   . Diabetes Mother   . Heart disease Mother     CHF  . Asthma Sister   . Diabetes Sister   . Heart disease Sister   . Hearing loss Neg Hx    History  Substance Use Topics  . Smoking status: Current Every Day Smoker -- 0.25 packs/day for 10 years    Types: Cigarettes  . Smokeless tobacco: Never Used  . Alcohol Use: Yes   OB History   Grav Para Term Preterm Abortions TAB SAB Ect Mult Living   1 1 1  0 0 0 0 0 0 1     Review of Systems  Constitutional: Negative for fever and activity change.   Musculoskeletal: Positive for arthralgias and myalgias. Negative for back pain, joint swelling and neck pain.  Skin: Negative for wound.  Neurological: Negative for weakness and numbness.  Psychiatric/Behavioral: Negative for confusion.      Allergies  Amoxicillin  Home Medications   Current Outpatient Rx  Name  Route  Sig  Dispense  Refill  . albuterol (PROVENTIL HFA;VENTOLIN HFA) 108 (90 BASE) MCG/ACT inhaler   Inhalation   Inhale 2 puffs into the lungs every 6 (six) hours as needed for wheezing or shortness of breath.         Marland Kitchen albuterol (PROVENTIL) (5 MG/ML) 0.5% nebulizer solution   Nebulization   Take 2.5 mg by nebulization every 6 (six) hours as needed for wheezing or shortness of breath.         . calcium carbonate (TUMS - DOSED IN MG ELEMENTAL CALCIUM) 500 MG chewable tablet   Oral   Chew 1 tablet by mouth daily as needed for indigestion or heartburn.         . metroNIDAZOLE (FLAGYL) 500 MG tablet   Oral   Take 1 tablet (500 mg total) by mouth 2 (two) times daily.   14 tablet   1   . polyethylene glycol (MIRALAX / GLYCOLAX) packet   Oral   Take 17 g by mouth daily as needed  for mild constipation.          BP 129/83  Pulse 72  Temp(Src) 97.9 F (36.6 C) (Oral)  Resp 18  Ht 5\' 2"  (1.575 m)  Wt 272 lb (123.378 kg)  BMI 49.74 kg/m2  SpO2 100%  LMP 01/01/2014 Physical Exam  Nursing note and vitals reviewed. Constitutional: She is oriented to person, place, and time. She appears well-developed and well-nourished. No distress.  HENT:  Head: Normocephalic and atraumatic.  Eyes: EOM are normal. Pupils are equal, round, and reactive to light.  Neck: Normal range of motion. Neck supple. No tracheal deviation present.  Cardiovascular: Normal rate.  Exam reveals no decreased pulses.   Cap refill <2 seconds   Pulmonary/Chest: Effort normal. No respiratory distress.  Musculoskeletal: Normal range of motion. She exhibits tenderness. She exhibits no edema.        Right wrist: Normal.       Right hand: She exhibits tenderness and swelling. She exhibits normal capillary refill.       Hands: Neurological: She is alert and oriented to person, place, and time. No sensory deficit.  Sensation intact   Skin: Skin is warm and dry. No erythema.  Right second digit:  TTP of MCP to PIP joint with mild swelling noted.  No erythema appreciated  [place] 0/4 signs   Psychiatric: She has a normal mood and affect. Her behavior is normal.    ED Course  Procedures (including critical care time) COORDINATION OF CARE:  Nursing notes reviewed. Vital signs reviewed. Initial pt interview and examination performed.   Filed Vitals:   01/21/14 1049  BP: 129/83  Pulse: 72  Temp: 97.9 F (36.6 C)  TempSrc: Oral  Resp: 18  Height: 5\' 2"  (1.575 m)  Weight: 272 lb (123.378 kg)  SpO2: 100%    11:04 AM-Discussed work up plan with pt at bedside, which includes  Orders Placed This Encounter  Procedures  . DG Hand Complete Right    Standing Status: Standing     Number of Occurrences: 1     Standing Expiration Date:     Order Specific Question:  Reason for exam:    Answer:  HAND PAIN  . Pt agrees with plan.  Treatment plan initiated:Medications - No data to display  Initial diagnostic testing ordered.    Labs Review Labs Reviewed - No data to display Imaging Review Dg Hand Complete Right  01/21/2014   CLINICAL DATA:  Pain and swelling in the thumb and index finger.  EXAM: RIGHT HAND - COMPLETE 3+ VIEW  COMPARISON:  None.  FINDINGS: Mild soft tissue swelling is evident in the thumb and index finger. There is no radiopaque foreign body. The bones are within normal limits. The joints are located. The soft tissue swelling is diffuse. The remainder the hand is unremarkable.  IMPRESSION: Diffuse soft tissue swelling over the thumb and index finger without acute or focal osseous abnormality.  No radiopaque foreign body.   Electronically Signed   By: Gennette Pachris   Mattern M.D.   On: 01/21/2014 11:14   11:24 AM Nursing Notes Reviewed/ Care Coordinated Applicable Imaging Reviewed and incorporated into ED treatment Discussed results and treatment plan with pt. Pt demonstrates understanding and agrees with plan. Pt informed of return precautions and is comfortable with discharge at this time.    Return with worsening swelling. Discussed flexor synovitis and need to return with these signs.   Finger splint by ortho tech.   Patient was counseled on RICE protocol and  told to rest injury, use ice for no longer than 15 minutes every hour, compress the area, and elevate above the level of their heart as much as possible to reduce swelling. Questions answered. Patient verbalized understanding.     EKG Interpretation None      MDM   Final diagnoses:  Finger pain   Finger swelling, unclear etiology. Do not suspect flexor tenosynovitis given this presentation. NSAIDs, immobilization at this point. Clear return instructions given.   I personally performed the services described in this documentation, which was scribed in my presence. The recorded information has been reviewed and is accurate.    Renne Crigler, PA-C 01/21/14 1152

## 2014-01-21 NOTE — ED Notes (Signed)
Patient states R hand pain started 2 x days ago.  Patient denies injury of any kind.   Patient does have redness and swelling to R 1st finger.

## 2014-01-21 NOTE — ED Provider Notes (Signed)
Medical screening examination/treatment/procedure(s) were performed by non-physician practitioner and as supervising physician I was immediately available for consultation/collaboration.  Aivah Putman L Birdie Fetty, MD 01/21/14 1456 

## 2014-03-13 ENCOUNTER — Emergency Department (HOSPITAL_COMMUNITY)
Admission: EM | Admit: 2014-03-13 | Discharge: 2014-03-13 | Disposition: A | Discharge status: Home / Self Care | Payer: Medicare Other | Attending: Emergency Medicine | Admitting: Emergency Medicine

## 2014-03-13 ENCOUNTER — Encounter (HOSPITAL_COMMUNITY): Payer: Self-pay | Admitting: Emergency Medicine

## 2014-03-13 DIAGNOSIS — Z880 Allergy status to penicillin: Secondary | ICD-10-CM | POA: Insufficient documentation

## 2014-03-13 DIAGNOSIS — J029 Acute pharyngitis, unspecified: Secondary | ICD-10-CM | POA: Insufficient documentation

## 2014-03-13 DIAGNOSIS — Z8719 Personal history of other diseases of the digestive system: Secondary | ICD-10-CM | POA: Diagnosis not present

## 2014-03-13 DIAGNOSIS — E669 Obesity, unspecified: Secondary | ICD-10-CM | POA: Insufficient documentation

## 2014-03-13 DIAGNOSIS — Z8742 Personal history of other diseases of the female genital tract: Secondary | ICD-10-CM | POA: Diagnosis not present

## 2014-03-13 DIAGNOSIS — F17200 Nicotine dependence, unspecified, uncomplicated: Secondary | ICD-10-CM | POA: Insufficient documentation

## 2014-03-13 DIAGNOSIS — B9789 Other viral agents as the cause of diseases classified elsewhere: Secondary | ICD-10-CM

## 2014-03-13 DIAGNOSIS — Z8659 Personal history of other mental and behavioral disorders: Secondary | ICD-10-CM | POA: Diagnosis not present

## 2014-03-13 DIAGNOSIS — Z79899 Other long term (current) drug therapy: Secondary | ICD-10-CM | POA: Insufficient documentation

## 2014-03-13 DIAGNOSIS — IMO0002 Reserved for concepts with insufficient information to code with codable children: Secondary | ICD-10-CM | POA: Diagnosis not present

## 2014-03-13 DIAGNOSIS — J45909 Unspecified asthma, uncomplicated: Secondary | ICD-10-CM | POA: Insufficient documentation

## 2014-03-13 DIAGNOSIS — J028 Acute pharyngitis due to other specified organisms: Secondary | ICD-10-CM

## 2014-03-13 LAB — RAPID STREP SCREEN (MED CTR MEBANE ONLY): Streptococcus, Group A Screen (Direct): NEGATIVE

## 2014-03-13 MED ORDER — MAGIC MOUTHWASH W/LIDOCAINE: 5.0000 mL | Freq: Three times a day (TID) | ORAL | Status: DC | PRN

## 2014-03-13 NOTE — ED Provider Notes (Signed)
CSN: [id number]     Arrival date & time 03/13/14  [id number] History  This chart was scribed for non-physician practitioner, Fayrene HelperBowie Caeli Linehan , working with Richardean Canalavid H Yao, MD, by Tana ConchStephen Methvin ED Scribe. This patient was seen in TR07C/TR07C and the patient's care was started at 10:07 AM.    Chief Complaint  Patient presents with  . Sore Throat     The history is provided by the patient. No language interpreter was used.    HPI Comments: Stacy Blackwell is a 33 y.o. female who presents to the Emergency Department complaining of a worsening sore throat that has been present for 2 days, she first thought it was just her allergies. Pt reports that she feels like "my throat is closing up" and that her neck is swollen.  She reports that she is able to swallow normally. She has treated with her allergy medication, Nyquil, salt water, and other OTC to no effect. She denies any SOB, cough, fever, chills, rash, injuries, and ear pain. Pt is a current everyday smoker, PPD 0.25.    Past Medical History  Diagnosis Date  . Asthma   . Vertigo   . Acid reflux   . Obesity   . Depression     doing good  . Ovarian cyst    Past Surgical History  Procedure Laterality Date  . Cystectomy     Family History  Problem Relation Age of Onset  . Asthma Mother   . Diabetes Mother   . Heart disease Mother     CHF  . Asthma Sister   . Diabetes Sister   . Heart disease Sister   . Hearing loss Neg Hx    History  Substance Use Topics  . Smoking status: Current Every Day Smoker -- 0.25 packs/day for 10 years    Types: Cigarettes  . Smokeless tobacco: Never Used  . Alcohol Use: Yes   OB History   Grav Para Term Preterm Abortions TAB SAB Ect Mult Living   1 1 1  0 0 0 0 0 0 1     Review of Systems  Constitutional: Negative for fever, chills, activity change and appetite change.  HENT: Positive for sore throat. Negative for ear discharge, ear pain, sinus pressure, sneezing and trouble swallowing.   Eyes:  Negative for pain, discharge, redness and itching.  Respiratory: Negative for cough and shortness of breath.   Cardiovascular: Negative for chest pain.  Gastrointestinal: Negative for abdominal pain.  Musculoskeletal: Positive for neck pain. Negative for arthralgias and back pain.      Allergies  Amoxicillin  Home Medications   Prior to Admission medications   Medication Sig Start Date End Date Taking? Authorizing Provider  albuterol (PROVENTIL HFA;VENTOLIN HFA) 108 (90 BASE) MCG/ACT inhaler Inhale 2 puffs into the lungs every 6 (six) hours as needed for wheezing or shortness of breath.   Yes Historical Provider, MD  albuterol (PROVENTIL) (5 MG/ML) 0.5% nebulizer solution Take 2.5 mg by nebulization every 6 (six) hours as needed for wheezing or shortness of breath.   Yes Historical Provider, MD  mometasone-formoterol (DULERA) 200-5 MCG/ACT AERO Inhale 2 puffs into the lungs 2 (two) times daily.   Yes Historical Provider, MD  polyethylene glycol (MIRALAX / GLYCOLAX) packet Take 17 g by mouth daily as needed for mild constipation.   Yes Historical Provider, MD   BP 92/62  Pulse 98  Temp(Src) 98.4 F (36.9 C) (Oral)  Resp 17  Ht 5\' 2"  (1.575 m)  Wt  267 lb (121.11 kg)  BMI 48.82 kg/m2  SpO2 100%  LMP 02/18/2014 Physical Exam  Nursing note and vitals reviewed. Constitutional: She is oriented to person, place, and time. She appears well-developed and well-nourished.  HENT:  Head: Normocephalic and atraumatic.  Right Ear: External ear normal.  Left Ear: External ear normal.  Mouth/Throat: Uvula is midline. No trismus in the jaw. No oropharyngeal exudate, posterior oropharyngeal edema or tonsillar abscesses.  Eyes: Conjunctivae and EOM are normal. Pupils are equal, round, and reactive to light. No scleral icterus.  Neck: Normal range of motion. Neck supple. No thyromegaly present.  Cardiovascular: Normal rate and regular rhythm.  Exam reveals no gallop and no friction rub.   No  murmur heard. Pulmonary/Chest: Effort normal. No stridor. She has no wheezes. She has no rales. She exhibits no tenderness.  Abdominal: She exhibits no distension. There is no tenderness. There is no rebound.  Musculoskeletal: Normal range of motion. She exhibits no edema.  Lymphadenopathy:    She has no cervical adenopathy.  No evidence of deep tissue infection. Pt is sore on the left side.  Neurological: She is alert and oriented to person, place, and time. She exhibits normal muscle tone. Coordination normal.  Skin: No rash noted. No erythema.  Psychiatric: She has a normal mood and affect. Her behavior is normal.    ED Course  Procedures (including critical care time)  DIAGNOSTIC STUDIES: Oxygen Saturation is 100% on RA, normal by my interpretation.    COORDINATION OF CARE:   10:13 AM-sore throat without evidence of airway compromise.  Strep test negative.  Pt without difficulty talking.  No evidence of parotitis.  Will give magic mouthwash.   Labs Review Labs Reviewed  RAPID STREP SCREEN    Imaging Review No results found.   EKG Interpretation None      MDM   Final diagnoses:  Sore throat (viral)    BP 92/62  Pulse 98  Temp(Src) 98.4 F (36.9 C) (Oral)  Resp 17  Ht 5\' 2"  (1.575 m)  Wt 267 lb (121.11 kg)  BMI 48.82 kg/m2  SpO2 100%  LMP 02/18/2014   I personally performed the services described in this documentation, which was scribed in my presence. The recorded information has been reviewed and is accurate.     Fayrene Helper, PA-C 03/13/14 1046

## 2014-03-13 NOTE — ED Notes (Signed)
shes had sore throat and "my neck feels swollen" x 2 days. No fevers. She tried several otc medications with no relief

## 2014-03-13 NOTE — Discharge Instructions (Signed)
Sore Throat A sore throat is pain, burning, irritation, or scratchiness of the throat. There is often pain or tenderness when swallowing or talking. A sore throat may be accompanied by other symptoms, such as coughing, sneezing, fever, and swollen neck glands. A sore throat is often the first sign of another sickness, such as a cold, flu, strep throat, or mononucleosis (commonly known as mono). Most sore throats go away without medical treatment. CAUSES  The most common causes of a sore throat include:  A viral infection, such as a cold, flu, or mono.  A bacterial infection, such as strep throat, tonsillitis, or whooping cough.  Seasonal allergies.  Dryness in the air.  Irritants, such as smoke or pollution.  Gastroesophageal reflux disease (GERD). HOME CARE INSTRUCTIONS   Only take over-the-counter medicines as directed by your caregiver.  Drink enough fluids to keep your urine clear or pale yellow.  Rest as needed.  Try using throat sprays, lozenges, or sucking on hard candy to ease any pain (if older than 4 years or as directed).  Sip warm liquids, such as broth, herbal tea, or warm water with honey to relieve pain temporarily. You may also eat or drink cold or frozen liquids such as frozen ice pops.  Gargle with salt water (mix 1 tsp salt with 8 oz of water).  Do not smoke and avoid secondhand smoke.  Put a cool-mist humidifier in your bedroom at night to moisten the air. You can also turn on a hot shower and sit in the bathroom with the door closed for 5 10 minutes. SEEK IMMEDIATE MEDICAL CARE IF:  You have difficulty breathing.  You are unable to swallow fluids, soft foods, or your saliva.  You have increased swelling in the throat.  Your sore throat does not get better in 7 days.  You have nausea and vomiting.  You have a fever or persistent symptoms for more than 2 3 days.  You have a fever and your symptoms suddenly get worse. MAKE SURE YOU:   Understand  these instructions.  Will watch your condition.  Will get help right away if you are not doing well or get worse. Document Released: 11/14/2004 Document Revised: 09/23/2012 Document Reviewed: 06/14/2012 [employer] Patient Information 2014 [employer].  

## 2014-03-14 NOTE — ED Provider Notes (Signed)
Medical screening examination/treatment/procedure(s) were performed by non-physician practitioner and as supervising physician I was immediately available for consultation/collaboration.   EKG Interpretation None        Richardean Canal, MD 03/14/14 [phone]

## 2014-03-15 LAB — CULTURE, GROUP A STREP

## 2014-03-17 ENCOUNTER — Encounter (HOSPITAL_COMMUNITY): Payer: Self-pay | Admitting: Emergency Medicine

## 2014-03-17 ENCOUNTER — Emergency Department (INDEPENDENT_AMBULATORY_CARE_PROVIDER_SITE_OTHER)
Admission: EM | Admit: 2014-03-17 | Discharge: 2014-03-17 | Disposition: A | Discharge status: Home / Self Care | Payer: Medicare Other | Source: Home / Self Care | Attending: Emergency Medicine | Admitting: Emergency Medicine

## 2014-03-17 DIAGNOSIS — H1045 Other chronic allergic conjunctivitis: Secondary | ICD-10-CM | POA: Diagnosis not present

## 2014-03-17 DIAGNOSIS — J309 Allergic rhinitis, unspecified: Secondary | ICD-10-CM | POA: Diagnosis not present

## 2014-03-17 DIAGNOSIS — J029 Acute pharyngitis, unspecified: Secondary | ICD-10-CM

## 2014-03-17 DIAGNOSIS — H1010 Acute atopic conjunctivitis, unspecified eye: Secondary | ICD-10-CM

## 2014-03-17 LAB — POCT RAPID STREP A: Streptococcus, Group A Screen (Direct): NEGATIVE

## 2014-03-17 MED ORDER — FLUTICASONE PROPIONATE 50 MCG/ACT NA SUSP: 2.0000 | Freq: Two times a day (BID) | NASAL | Status: DC

## 2014-03-17 MED ORDER — OLOPATADINE HCL 0.2 % OP SOLN: OPHTHALMIC | Status: DC

## 2014-03-17 MED ORDER — CLINDAMYCIN HCL 300 MG PO CAPS: 300.0000 mg | ORAL_CAPSULE | Freq: Three times a day (TID) | ORAL | Status: DC

## 2014-03-17 MED ORDER — IPRATROPIUM BROMIDE 0.06 % NA SOLN: 2.0000 | NASAL | Status: DC | PRN

## 2014-03-17 MED ORDER — CHLORPHENIRAMINE-PSE-IBUPROFEN 2-30-200 MG PO TABS: ORAL_TABLET | ORAL | Status: DC

## 2014-03-17 MED ORDER — FLUCONAZOLE 150 MG PO TABS: 150.0000 mg | ORAL_TABLET | Freq: Once | ORAL | Status: DC

## 2014-03-17 NOTE — ED Notes (Signed)
Pt  Reports  Pain     sorethroat         Hurts  To  Swallow            Seen  Er  4  Days  Ago        Pt   Reports    Neck  Glands        Are  Swollen  As  Well

## 2014-03-17 NOTE — ED Provider Notes (Signed)
CSN: [id number]     Arrival date & time 03/17/14  4076 History   First MD Initiated Contact with Patient 03/17/14 [phone]     Chief Complaint  Patient presents with  . Sore Throat   (Consider location/radiation/quality/duration/timing/severity/associated sxs/prior Treatment) HPI Comments: 33 year old female presents for evaluation of sore throat. She has had this for 2 weeks now. She went to the emergency department 4 days ago and was told that she has allergies, given Magic mouthwash, but her pain continues to worsen. She was also told to take Zyrtec which is not helping. She also admits to nasal congestion, rhinorrhea, sneezing, itching watering eyes. She has a slight cough also. Dry, nonproductive. Worse at night. No fever, chills, NVD.  Patient is a 33 y.o. female presenting with pharyngitis.  Sore Throat Pertinent negatives include no shortness of breath.    Past Medical History  Diagnosis Date  . Asthma   . Vertigo   . Acid reflux   . Obesity   . Depression     doing good  . Ovarian cyst    Past Surgical History  Procedure Laterality Date  . Cystectomy     Family History  Problem Relation Age of Onset  . Asthma Mother   . Diabetes Mother   . Heart disease Mother     CHF  . Asthma Sister   . Diabetes Sister   . Heart disease Sister   . Hearing loss Neg Hx    History  Substance Use Topics  . Smoking status: Current Every Day Smoker -- 0.25 packs/day for 10 years    Types: Cigarettes  . Smokeless tobacco: Never Used  . Alcohol Use: Yes   OB History   Grav Para Term Preterm Abortions TAB SAB Ect Mult Living   1 1 1  0 0 0 0 0 0 1     Review of Systems  HENT: Positive for congestion, postnasal drip, rhinorrhea and sore throat. Negative for ear pain and sinus pressure.   Respiratory: Positive for cough. Negative for chest tightness, shortness of breath and wheezing.   All other systems reviewed and are negative.   Allergies  Amoxicillin  Home Medications    Prior to Admission medications   Medication Sig Start Date End Date Taking? Authorizing Provider  albuterol (PROVENTIL HFA;VENTOLIN HFA) 108 (90 BASE) MCG/ACT inhaler Inhale 2 puffs into the lungs every 6 (six) hours as needed for wheezing or shortness of breath.    Historical Provider, MD  albuterol (PROVENTIL) (5 MG/ML) 0.5% nebulizer solution Take 2.5 mg by nebulization every 6 (six) hours as needed for wheezing or shortness of breath.    Historical Provider, MD  Alum & Mag Hydroxide-Simeth (MAGIC MOUTHWASH W/LIDOCAINE) SOLN Take 5 mLs by mouth 3 (three) times daily as needed for mouth pain. Swish and swallow three times daily as needed for sore throat 03/13/14   Fayrene Helper, PA-C  mometasone-formoterol (DULERA) 200-5 MCG/ACT AERO Inhale 2 puffs into the lungs 2 (two) times daily.    Historical Provider, MD  polyethylene glycol (MIRALAX / GLYCOLAX) packet Take 17 g by mouth daily as needed for mild constipation.    Historical Provider, MD   BP 124/81  Pulse 74  Temp(Src) 98.7 F (37.1 C) (Oral)  Resp 16  SpO2 100%  LMP 02/18/2014 Physical Exam  Nursing note and vitals reviewed. Constitutional: She is oriented to person, place, and time. Vital signs are normal. She appears well-developed and well-nourished. No distress.  HENT:  Head: Normocephalic and atraumatic.  Nose: Nose normal. Right sinus exhibits no maxillary sinus tenderness and no frontal sinus tenderness. Left sinus exhibits no maxillary sinus tenderness and no frontal sinus tenderness.  Mouth/Throat: Uvula is midline, oropharynx is clear and moist and mucous membranes are normal.  Mild tonsillar enlargement bilaterally  Eyes:  Mildly injected conjunctiva bilaterally  Neck: Normal range of motion. Neck supple.  Pulmonary/Chest: Effort normal and breath sounds normal. No respiratory distress.  Lymphadenopathy:    She has no cervical adenopathy.  Neurological: She is alert and oriented to person, place, and time. She has  normal strength. Coordination normal.  Skin: Skin is warm and dry. No rash noted. She is not diaphoretic.  Psychiatric: She has a normal mood and affect. Judgment normal.    ED Course  Procedures (including critical care time) Labs Review Labs Reviewed  POCT RAPID STREP A ([facility] CARE ONLY)    Imaging Review No results found.   MDM   1. Pharyngitis   2. Allergic rhinitis   3. Allergic conjunctivitis    Given duration of treatment, we will try an antibiotic to control symptoms. Also treating for allergies. Followup as needed if no improvement in a few days.  Meds ordered this encounter  Medications  . clindamycin (CLEOCIN) 300 MG capsule    Sig: Take 1 capsule (300 mg total) by mouth 3 (three) times daily.    Dispense:  21 capsule    Refill:  0    Order Specific Question:  Supervising Provider    Answer:  Linna HoffKINDL, JAMES D [phone][[id number]]  . fluticasone (FLONASE) 50 MCG/ACT nasal spray    Sig: Place 2 sprays into both nostrils 2 (two) times daily. Decrease to 2 sprays/nostril daily after 5 days    Dispense:  16 g    Refill:  2    Order Specific Question:  Supervising Provider    Answer:  Linna HoffKINDL, JAMES D [phone][[id number]]  . ipratropium (ATROVENT) 0.06 % nasal spray    Sig: Place 2 sprays into both nostrils every 4 (four) hours as needed for rhinitis.    Dispense:  15 mL    Refill:  1    Order Specific Question:  Supervising Provider    Answer:  Bradd CanaryKINDL, JAMES D [id number]]  . Olopatadine HCl (PATADAY) 0.2 % SOLN    Sig: 1 drop per eye once daily as needed for redness, itching, or irritation    Dispense:  2.5 mL    Refill:  0    Order Specific Question:  Supervising Provider    Answer:  Linna HoffKINDL, JAMES D [phone][[id number]]  . Chlorpheniramine-PSE-Ibuprofen (ADVIL ALLERGY SINUS) 2-30-200 MG TABS    Sig: 1-2 tabs PO Q4-6 hrs PRN    Dispense:  30 each    Refill:  1    Order Specific Question:  Supervising Provider    Answer:  Bradd CanaryKINDL, JAMES D [[id number]]        Graylon GoodZachary H Evalene Vath, PA-C 03/17/14 1003

## 2014-03-17 NOTE — ED Provider Notes (Signed)
Medical screening examination/treatment/procedure(s) were performed by non-physician practitioner and as supervising physician I was immediately available for consultation/collaboration.  Abie Cheek, M.D.  Keithon Mccoin C Eran Mistry, MD 03/17/14 1146 

## 2014-03-17 NOTE — Discharge Instructions (Signed)
Pharyngitis Pharyngitis is redness, pain, and swelling (inflammation) of your pharynx.  CAUSES  Pharyngitis is usually caused by infection. Most of the time, these infections are from viruses (viral) and are part of a cold. However, sometimes pharyngitis is caused by bacteria (bacterial). Pharyngitis can also be caused by allergies. Viral pharyngitis may be spread from person to person by coughing, sneezing, and personal items or utensils (cups, forks, spoons, toothbrushes). Bacterial pharyngitis may be spread from person to person by more intimate contact, such as kissing.  SIGNS AND SYMPTOMS  Symptoms of pharyngitis include:   Sore throat.   Tiredness (fatigue).   Low-grade fever.   Headache.  Joint pain and muscle aches.  Skin rashes.  Swollen lymph nodes.  Plaque-like film on throat or tonsils (often seen with bacterial pharyngitis). DIAGNOSIS  Your health care provider will ask you questions about your illness and your symptoms. Your medical history, along with a physical exam, is often all that is needed to diagnose pharyngitis. Sometimes, a rapid strep test is done. Other lab tests may also be done, depending on the suspected cause.  TREATMENT  Viral pharyngitis will usually get better in 3 4 days without the use of medicine. Bacterial pharyngitis is treated with medicines that kill germs (antibiotics).  HOME CARE INSTRUCTIONS   Drink enough water and fluids to keep your urine clear or pale yellow.   Only take over-the-counter or prescription medicines as directed by your health care provider:   If you are prescribed antibiotics, make sure you finish them even if you start to feel better.   Do not take aspirin.   Get lots of rest.   Gargle with 8 oz of salt water ( tsp of salt per 1 qt of water) as often as every 1 2 hours to soothe your throat.   Throat lozenges (if you are not at risk for choking) or sprays may be used to soothe your throat. SEEK MEDICAL  CARE IF:   You have large, tender lumps in your neck.  You have a rash.  You cough up green, yellow-brown, or bloody spit. SEEK IMMEDIATE MEDICAL CARE IF:   Your neck becomes stiff.  You drool or are unable to swallow liquids.  You vomit or are unable to keep medicines or liquids down.  You have severe pain that does not go away with the use of recommended medicines.  You have trouble breathing (not caused by a stuffy nose). MAKE SURE YOU:   Understand these instructions.  Will watch your condition.  Will get help right away if you are not doing well or get worse. Document Released: 10/07/2005 Document Revised: 07/28/2013 Document Reviewed: 06/14/2013 [facility] Patient Information 2014 [place], [place].  Hay Fever Hay fever is an allergic reaction to particles in the air. It cannot be passed from person to person. It cannot be cured, but it can be controlled. CAUSES  Hay fever is caused by something that triggers an allergic reaction (allergens). The following are examples of allergens:  Ragweed.  Feathers.  Animal dander.  Grass and tree pollens.  Cigarette smoke.  House dust.  Pollution. SYMPTOMS   Sneezing.  Runny or stuffy nose.  Tearing eyes.  Itchy eyes, nose, mouth, throat, skin, or other area.  Sore throat.  Headache.  Decreased sense of smell or taste. DIAGNOSIS Your caregiver will perform a physical exam and ask questions about the symptoms you are having.Allergy testing may be done to determine exactly what triggers your hay fever.  TREATMENT  Over-the-counter medicines may help symptoms. These include:  Antihistamines.  Decongestants. These may help with nasal congestion.  Your caregiver may prescribe medicines if over-the-counter medicines do not work.  Some people benefit from allergy shots when other medicines are not helpful. HOME CARE INSTRUCTIONS   Avoid the allergen that is causing your symptoms, if possible.  Take all  medicine as told by your caregiver. SEEK MEDICAL CARE IF:   You have severe allergy symptoms and your current medicines are not helping.  Your treatment was working at one time, but you are now experiencing symptoms.  You have sinus congestion and pressure.  You develop a fever or headache.  You have thick nasal discharge.  You have asthma and have a worsening cough and wheezing. SEEK IMMEDIATE MEDICAL CARE IF:   You have swelling of your tongue or lips.  You have trouble breathing.  You feel lightheaded or like you are going to faint.  You have cold sweats.  You have a fever. Document Released: 10/07/2005 Document Revised: 12/30/2011 Document Reviewed: 01/02/2011 [facility] Patient Information 2014 [place]

## 2014-03-19 LAB — CULTURE, GROUP A STREP

## 2014-04-05 ENCOUNTER — Encounter (HOSPITAL_COMMUNITY): Payer: Self-pay | Admitting: Emergency Medicine

## 2014-04-05 ENCOUNTER — Emergency Department (HOSPITAL_COMMUNITY)
Admission: EM | Admit: 2014-04-05 | Discharge: 2014-04-05 | Disposition: A | Discharge status: Home / Self Care | Payer: Medicare Other | Attending: Emergency Medicine | Admitting: Emergency Medicine

## 2014-04-05 ENCOUNTER — Emergency Department (HOSPITAL_COMMUNITY): Payer: Medicare Other

## 2014-04-05 DIAGNOSIS — Y929 Unspecified place or not applicable: Secondary | ICD-10-CM | POA: Diagnosis not present

## 2014-04-05 DIAGNOSIS — Z880 Allergy status to penicillin: Secondary | ICD-10-CM | POA: Diagnosis not present

## 2014-04-05 DIAGNOSIS — Z8742 Personal history of other diseases of the female genital tract: Secondary | ICD-10-CM | POA: Insufficient documentation

## 2014-04-05 DIAGNOSIS — E669 Obesity, unspecified: Secondary | ICD-10-CM | POA: Insufficient documentation

## 2014-04-05 DIAGNOSIS — R062 Wheezing: Secondary | ICD-10-CM | POA: Diagnosis not present

## 2014-04-05 DIAGNOSIS — W230XXA Caught, crushed, jammed, or pinched between moving objects, initial encounter: Secondary | ICD-10-CM | POA: Insufficient documentation

## 2014-04-05 DIAGNOSIS — Y939 Activity, unspecified: Secondary | ICD-10-CM | POA: Diagnosis not present

## 2014-04-05 DIAGNOSIS — R05 Cough: Secondary | ICD-10-CM

## 2014-04-05 DIAGNOSIS — Z8659 Personal history of other mental and behavioral disorders: Secondary | ICD-10-CM | POA: Insufficient documentation

## 2014-04-05 DIAGNOSIS — S6990XA Unspecified injury of unspecified wrist, hand and finger(s), initial encounter: Secondary | ICD-10-CM | POA: Insufficient documentation

## 2014-04-05 DIAGNOSIS — M79643 Pain in unspecified hand: Secondary | ICD-10-CM

## 2014-04-05 DIAGNOSIS — IMO0002 Reserved for concepts with insufficient information to code with codable children: Secondary | ICD-10-CM | POA: Diagnosis not present

## 2014-04-05 DIAGNOSIS — M79609 Pain in unspecified limb: Secondary | ICD-10-CM | POA: Diagnosis not present

## 2014-04-05 DIAGNOSIS — Z792 Long term (current) use of antibiotics: Secondary | ICD-10-CM | POA: Diagnosis not present

## 2014-04-05 DIAGNOSIS — F17200 Nicotine dependence, unspecified, uncomplicated: Secondary | ICD-10-CM | POA: Diagnosis not present

## 2014-04-05 DIAGNOSIS — M7989 Other specified soft tissue disorders: Secondary | ICD-10-CM | POA: Diagnosis not present

## 2014-04-05 DIAGNOSIS — J45901 Unspecified asthma with (acute) exacerbation: Secondary | ICD-10-CM | POA: Diagnosis not present

## 2014-04-05 DIAGNOSIS — Z8719 Personal history of other diseases of the digestive system: Secondary | ICD-10-CM | POA: Insufficient documentation

## 2014-04-05 DIAGNOSIS — R059 Cough, unspecified: Secondary | ICD-10-CM

## 2014-04-05 DIAGNOSIS — Z79899 Other long term (current) drug therapy: Secondary | ICD-10-CM | POA: Insufficient documentation

## 2014-04-05 MED ORDER — PREDNISONE 20 MG PO TABS: 40.0000 mg | ORAL_TABLET | Freq: Every day | ORAL | Status: DC

## 2014-04-05 MED ORDER — IPRATROPIUM BROMIDE 0.02 % IN SOLN
0.5000 mg | Freq: Once | RESPIRATORY_TRACT | Status: AC
  Administered 2014-04-05: 0.5 mg via RESPIRATORY_TRACT
  Filled 2014-04-05: qty 2.5

## 2014-04-05 MED ORDER — ALBUTEROL SULFATE (2.5 MG/3ML) 0.083% IN NEBU
5.0000 mg | INHALATION_SOLUTION | Freq: Once | RESPIRATORY_TRACT | Status: AC
  Administered 2014-04-05: 5 mg via RESPIRATORY_TRACT
  Filled 2014-04-05: qty 6

## 2014-04-05 MED ORDER — HYDROCODONE-ACETAMINOPHEN 5-325 MG PO TABS: 1.0000 | ORAL_TABLET | Freq: Four times a day (QID) | ORAL | Status: DC | PRN

## 2014-04-05 NOTE — ED Provider Notes (Signed)
CSN: [id number]     Arrival date & time 04/05/14  0932 History  This chart was scribed for non-physician practitioner, Ivar Drape, working with Joya Gaskins, MD by Milly Jakob, ED Scribe. The patient was seen in room TR10C/TR10C. Patient's care was started at 10:45 AM.     Chief Complaint  Patient presents with  . Hand Injury  . Cough    The history is provided by the patient. No language interpreter was used.   HPI Comments: Stacy Blackwell is a 33 y.o. female who presents to the Emergency Department complaining of a right hand injury that occurred yesterday. She states that she slammed her hand in the car door after which she began having sudden onset, constant, right hand pain. She states that there is associated swelling. She has applied ice and taken ibuprofen with minimal relief.   She is additionally complaining of a intermittent cough productive of green sputum for the past 3 days. She states she has used her inhaler without relief. She states that she has a history of asthma. She denies fever. Patient is a smoker and is trying to quit.  Past Medical History  Diagnosis Date  . Asthma   . Vertigo   . Acid reflux   . Obesity   . Depression     doing good  . Ovarian cyst    Past Surgical History  Procedure Laterality Date  . Cystectomy     Family History  Problem Relation Age of Onset  . Asthma Mother   . Diabetes Mother   . Heart disease Mother     CHF  . Asthma Sister   . Diabetes Sister   . Heart disease Sister   . Hearing loss Neg Hx    History  Substance Use Topics  . Smoking status: Current Every Day Smoker -- 0.25 packs/day for 10 years    Types: Cigarettes  . Smokeless tobacco: Never Used  . Alcohol Use: Yes   OB History   Grav Para Term Preterm Abortions TAB SAB Ect Mult Living   1 1 1  0 0 0 0 0 0 1     Review of Systems  Constitutional: Negative for fever.  Respiratory: Positive for cough.   Musculoskeletal: Positive for  arthralgias (Right Hand).  Neurological: Negative for numbness.      Allergies  Amoxicillin  Home Medications   Prior to Admission medications   Medication Sig Start Date End Date Taking? Authorizing Seiji Wiswell  albuterol (PROVENTIL HFA;VENTOLIN HFA) 108 (90 BASE) MCG/ACT inhaler Inhale 2 puffs into the lungs every 6 (six) hours as needed for wheezing or shortness of breath.    Historical Prisha Hiley, MD  albuterol (PROVENTIL) (5 MG/ML) 0.5% nebulizer solution Take 2.5 mg by nebulization every 6 (six) hours as needed for wheezing or shortness of breath.    Historical Quantarius Genrich, MD  Alum & Mag Hydroxide-Simeth (MAGIC MOUTHWASH W/LIDOCAINE) SOLN Take 5 mLs by mouth 3 (three) times daily as needed for mouth pain. Swish and swallow three times daily as needed for sore throat 03/13/14   Fayrene Helper, PA-C  Chlorpheniramine-PSE-Ibuprofen (ADVIL ALLERGY SINUS) 2-30-200 MG TABS 1-2 tabs PO Q4-6 hrs PRN 03/17/14   Graylon Good, PA-C  clindamycin (CLEOCIN) 300 MG capsule Take 1 capsule (300 mg total) by mouth 3 (three) times daily. 03/17/14   Graylon Good, PA-C  fluconazole (DIFLUCAN) 150 MG tablet Take 1 tablet (150 mg total) by mouth once. Pick up the refill and and take second  dose in 5 days if symptoms have not resolved 03/17/14   Graylon GoodZachary H Baker, PA-C  fluticasone (FLONASE) 50 MCG/ACT nasal spray Place 2 sprays into both nostrils 2 (two) times daily. Decrease to 2 sprays/nostril daily after 5 days 03/17/14   Graylon GoodZachary H Baker, PA-C  ipratropium (ATROVENT) 0.06 % nasal spray Place 2 sprays into both nostrils every 4 (four) hours as needed for rhinitis. 03/17/14   Adrian BlackwaterZachary H Baker, PA-C  mometasone-formoterol (DULERA) 200-5 MCG/ACT AERO Inhale 2 puffs into the lungs 2 (two) times daily.    Historical Wilmon Conover, MD  Olopatadine HCl (PATADAY) 0.2 % SOLN 1 drop per eye once daily as needed for redness, itching, or irritation 03/17/14   Graylon GoodZachary H Baker, PA-C  polyethylene glycol (MIRALAX / GLYCOLAX) packet Take 17 g  by mouth daily as needed for mild constipation.    Historical Britania Shreeve, MD   Triage Vitals: BP 144/74  Pulse 92  Temp(Src) 98.8 F (37.1 C)  Resp 18  SpO2 98%  LMP 03/21/2014 Physical Exam  Nursing note and vitals reviewed. Constitutional: She is oriented to person, place, and time. She appears well-developed and well-nourished. No distress.  HENT:  Head: Normocephalic and atraumatic.  Eyes: Conjunctivae and EOM are normal.  Neck: Neck supple. No tracheal deviation present.  Cardiovascular: Normal rate, regular rhythm and normal heart sounds.  Exam reveals no gallop and no friction rub.   No murmur heard. Intact distal pulses with brisk capillary refill.  Pulmonary/Chest: Effort normal. No respiratory distress. She has wheezes.  Diffuse and expiratory wheezes.  Musculoskeletal: Normal range of motion.  Right hand moderately swollen diffusley, tender to palpation over the metacarpals with palpation at the anatomical snuff box. ROM and strength limited secondary to pain. No bony abnormality or deformity.   Neurological: She is alert and oriented to person, place, and time.  Sensation intact.  Skin: Skin is warm and dry.  Psychiatric: She has a normal mood and affect. Her behavior is normal.    ED Course  Procedures (including critical care time) DIAGNOSTIC STUDIES: Oxygen Saturation is 98% on room air, normal by my interpretation.    COORDINATION OF CARE: 10:49 AM-Discussed treatment plan which includes hand x-ray with pt at bedside and pt agreed to plan.   Labs Review Labs Reviewed - No data to display  Imaging Review Dg Chest 2 View  04/05/2014   CLINICAL DATA:  Cough for 3 days.  EXAM: CHEST  2 VIEW  COMPARISON:  10/26/2013 and 09/10/2013.  FINDINGS: The heart size and mediastinal contours are stable. There is stable chronic accentuation of the bronchovascular markings. No edema, confluent airspace opacity or significant pleural effusion is seen. The osseous structures.   IMPRESSION: Stable chronic interstitial prominence. No acute cardiopulmonary process.   Electronically Signed   By: Roxy HorsemanBill  Veazey M.D.   On: 04/05/2014 11:55   Dg Hand Complete Right  04/05/2014   CLINICAL DATA:  Pain and injury. Smashed hand in door yesterday. Pain and swelling.  EXAM: RIGHT HAND - COMPLETE 3+ VIEW  COMPARISON:  01/21/2014  FINDINGS: There is no evidence of acute fracture or dislocation. Bone mineralization appears grossly normal. Joint spaces are preserved. No lytic or blastic osseous lesion is identified. No focal soft tissue abnormality or radiopaque foreign body is seen.  IMPRESSION: No acute osseous abnormality identified.   Electronically Signed   By: Sebastian AcheAllen  Grady   On: 04/05/2014 11:56     EKG Interpretation None      MDM   Final  diagnoses:  Hand pain  Cough  Wheeze    Patient with right hand pain after slamming the hand in the door. Plain films are negative. Will treat like hand sprain with a splint. Patient does have some snuff box tenderness, recommend hand followup in one week for repeat imaging.  Patient also having mild asthma exacerbation, treated wheezing with nebulizer treatment. Repeat assessment, lungs are clear to auscultation. Will give a short course of prednisone. Patient has inhalers and breathing treatments at home.  I personally performed the services described in this documentation, which was scribed in my presence. The recorded information has been reviewed and is accurate.     Roxy Horsemanobert Browning, PA-C 04/05/14 1208

## 2014-04-05 NOTE — Discharge Instructions (Signed)
Intermetacarpal Sprain The intermetacarpal ligaments run between the knuckles, at the base of the fingers. These ligaments are vulnerable to sprain and injury, in which the ligament becomes over stretched or torn. Intermetacarpal sprains are classified into 3 categories. Grade 1 sprains cause pain, but the tendon is not lengthened. Grade 2 sprains include a lengthened ligament, due to the ligament being stretched or partially ruptured. With grade 2 sprains there is still function, although function may be decreased. Grade 3 sprains include a complete tear of the ligament, and the joint usually displays a loss of function.  SYMPTOMS   Severe pain at the time of injury.  Often, a feeling of popping or tearing inside the hand.  Tenderness and inflammation at the knuckles.  Bruising within a couple days of injury.  Impaired ability to use the hand. CAUSES  This condition occurs when the intermeatacarpal ligaments are subjected to a greater stress than they can handle. This causes the ligaments to become stretched or torn. RISK INCREASES WITH:  Previous hand injury.  Fighting sports (boxing, wrestling, martial arts).  Sports in which you could fall on an outstretched hand (soccer, basketball, volleyball).  Other sports with repeated hand trauma (water polo, gymnastics).  Poor hand strength and flexibility.  Inadequate or poorly fitted protective equipment. PREVENTION   Warm up and stretch properly before activity.  Maintain appropriate conditioning:  Hand flexibility.  Muscle strength and endurance.  Applying tape, protective strapping, or a brace may help prevent injury.  Provide the hand with support during sports and practice activities, for 6 to 12 months following injury. PROGNOSIS  With proper treatment, healing should occur without impairment. The length of healing varies from 2 to 12 weeks, depending on the severity of injury. RELATED COMPLICATIONS   Longer healing  time, if activities are resumed too soon.  Recurring symptoms or repeated injury, resulting in a chronic problem.  Injury to other nearby structures (bone, cartilage, tendon).  Arthritis of the knuckle (intermetacarpal) joint, with repeated sprains.  Prolonged disability (sometimes).  Hand and finger stiffness or weakness. TREATMENT Treatment first involves ice and medicine, to reduce pain and inflammation. An elastic compression bandage may be worn, to reduce discomfort and to protect the area. Depending on the severity of injury, you may be required to restrain the area with a cast, splint, or brace. After the ligament has been allowed to heal, strengthening and stretching exercises may be needed, to regain strength and a full range of motion. Exercises may be completed at home or with a therapist. Surgery is rarely needed. MEDICATION   If pain medicine is needed, nonsteroidal anti-inflammatory medicines (aspirin and ibuprofen), or other minor pain relievers (acetaminophen), are often advised.  Do not take pain medicine for 7 days before surgery.  Stronger pain relievers may be prescribed, if your caregiver thinks they are needed. Use only as directed and only as much as you need. HEAT AND COLD  Cold treatment (icing) should be applied for 10 to 15 minutes every 2 to 3 hours for inflammation and pain, and immediately after activity that aggravates your symptoms. Use ice packs or an ice massage.  Heat treatment may be used before performing stretching and strengthening activities prescribed by your caregiver, physical therapist, or athletic trainer. Use a heat pack or a warm water soak. SEEK MEDICAL CARE IF:   Symptoms remain or get worse, despite treatment for longer than 2 to 4 weeks.  You experience pain, numbness, discoloration, or coldness in the hand or fingers.  You develop blue, gray, or dark fingernails.  Any of the following occur after surgery: increased pain, swelling,  redness, drainage of fluids, bleeding in the affected area, or signs of infection, including fever.  New, unexplained symptoms develop. (Drugs used in treatment may produce side effects.) Document Released: 10/07/2005 Document Revised: 12/30/2011 Document Reviewed: 01/19/2009 [facility] Patient Information 2014 [place], [place]. Asthma Attack Prevention Although there is no way to prevent asthma from starting, you can take steps to control the disease and reduce its symptoms. Learn about your asthma and how to control it. Take an active role to control your asthma by working with your health care provider to create and follow an asthma action plan. An asthma action plan guides you in:  Taking your medicines properly.  Avoiding things that set off your asthma or make your asthma worse (asthma triggers).  Tracking your level of asthma control.  Responding to worsening asthma.  Seeking emergency care when needed. To track your asthma, keep records of your symptoms, check your peak flow number using a handheld device that shows how well air moves out of your lungs (peak flow meter), and get regular asthma checkups.  WHAT ARE SOME WAYS TO PREVENT AN ASTHMA ATTACK?  Take medicines as directed by your health care provider.  Keep track of your asthma symptoms and level of control.  With your health care provider, write a detailed plan for taking medicines and managing an asthma attack. Then be sure to follow your action plan. Asthma is an ongoing condition that needs regular monitoring and treatment.  Identify and avoid asthma triggers. Many outdoor allergens and irritants (such as pollen, mold, cold air, and air pollution) can trigger asthma attacks. Find out what your asthma triggers are and take steps to avoid them.  Monitor your breathing. Learn to recognize warning signs of an attack, such as coughing, wheezing, or shortness of breath. Your lung function may decrease before you notice any signs  or symptoms, so regularly measure and record your peak airflow with a home peak flow meter.  Identify and treat attacks early. If you act quickly, you are less likely to have a severe attack. You will also need less medicine to control your symptoms. When your peak flow measurements decrease and alert you to an upcoming attack, take your medicine as instructed and immediately stop any activity that may have triggered the attack. If your symptoms do not improve, get medical help.  Pay attention to increasing quick-relief inhaler use. If you find yourself relying on your quick-relief inhaler, your asthma is not under control. See your health care provider about adjusting your treatment. WHAT CAN MAKE MY SYMPTOMS WORSE? A number of common things can set off or make your asthma symptoms worse and cause temporary increased inflammation of your airways. Keep track of your asthma symptoms for several weeks, detailing all the environmental and emotional factors that are linked with your asthma. When you have an asthma attack, go back to your asthma diary to see which factor, or combination of factors, might have contributed to it. Once you know what these factors are, you can take steps to control many of them. If you have allergies and asthma, it is important to take asthma prevention steps at home. Minimizing contact with the substance to which you are allergic will help prevent an asthma attack. Some triggers and ways to avoid these triggers are: Animal Dander:  Some people are allergic to the flakes of skin or dried saliva from animals  with fur or feathers.   There is no such thing as a hypoallergenic dog or cat breed. All dogs or cats can cause allergies, even if they don't shed.  Keep these pets out of your home.  If you are not able to keep a pet outdoors, keep the pet out of your bedroom and other sleeping areas at all times, and keep the door closed.  Remove carpets and furniture covered with cloth  from your home. If that is not possible, keep the pet away from fabric-covered furniture and carpets. Dust Mites: Many people with asthma are allergic to dust mites. Dust mites are tiny bugs that are found in every home in mattresses, pillows, carpets, fabric-covered furniture, bedcovers, clothes, stuffed toys, and other fabric-covered items.   Cover your mattress in a special dust-proof cover.  Cover your pillow in a special dust-proof cover, or wash the pillow each week in hot water. Water must be hotter than 130 F (54.4 C) to kill dust mites. Cold or warm water used with detergent and bleach can also be effective.  Wash the sheets and blankets on your bed each week in hot water.  Try not to sleep or lie on cloth-covered cushions.  Call ahead when traveling and ask for a smoke-free hotel room. Bring your own bedding and pillows in case the hotel only supplies feather pillows and down comforters, which may contain dust mites and cause asthma symptoms.  Remove carpets from your bedroom and those laid on concrete, if you can.  Keep stuffed toys out of the bed, or wash the toys weekly in hot water or cooler water with detergent and bleach. Cockroaches: Many people with asthma are allergic to the droppings and remains of cockroaches.   Keep food and garbage in closed containers. Never leave food out.  Use poison baits, traps, powders, gels, or paste (for example, boric acid).  If a spray is used to kill cockroaches, stay out of the room until the odor goes away. Indoor Mold:  Fix leaky faucets, pipes, or other sources of water that have mold around them.  Clean floors and moldy surfaces with a fungicide or diluted bleach.  Avoid using humidifiers, vaporizers, or swamp coolers. These can spread molds through the air. Pollen and Outdoor Mold:  When pollen or mold spore counts are high, try to keep your windows closed.  Stay indoors with windows closed from late morning to afternoon.  Pollen and some mold spore counts are highest at that time.  Ask your health care provider whether you need to take anti-inflammatory medicine or increase your dose of the medicine before your allergy season starts. Other Irritants to Avoid:  Tobacco smoke is an irritant. If you smoke, ask your health care provider how you can quit. Ask family members to quit smoking too. Do not allow smoking in your home or car.  If possible, do not use a wood-burning stove, kerosene heater, or fireplace. Minimize exposure to all sources of smoke, including to incense, candles, fires, and fireworks.  Try to stay away from strong odors and sprays, such as perfume, talcum powder, hair spray, and paints.  Decrease humidity in your home and use an indoor air cleaning device. Reduce indoor humidity to below 60%. Dehumidifiers or central air conditioners can do this.  Decrease house dust exposure by changing furnace and air cooler filters frequently.  Try to have someone else vacuum for you once or twice a week. Stay out of rooms while they are being vacuumed  and for a short while afterward.  If you vacuum, use a dust mask from a hardware store, a double-layered or microfilter vacuum cleaner bag, or a vacuum cleaner with a HEPA filter.  Sulfites in foods and beverages can be irritants. Do not drink beer or wine or eat dried fruit, processed potatoes, or shrimp if they cause asthma symptoms.  Cold air can trigger an asthma attack. Cover your nose and mouth with a scarf on cold or windy days.  Several health conditions can make asthma more difficult to manage, including a runny nose, sinus infections, reflux disease, psychological stress, and sleep apnea. Work with your health care provider to manage these conditions.  Avoid close contact with people who have a respiratory infection such as a cold or the flu, since your asthma symptoms may get worse if you catch the infection. Wash your hands thoroughly after  touching items that may have been handled by people with a respiratory infection.  Get a flu shot every year to protect against the flu virus, which often makes asthma worse for days or weeks. Also get a pneumonia shot if you have not previously had one. Unlike the flu shot, the pneumonia shot does not need to be given yearly. Medicines:  Talk to your health care provider about whether it is safe for you to take aspirin or non-steroidal anti-inflammatory medicines (NSAIDs). In a small number of people with asthma, aspirin and NSAIDs can cause asthma attacks. These medicines must be avoided by people who have known aspirin-sensitive asthma. It is important that people with aspirin-sensitive asthma read labels of all over-the-counter medicines used to treat pain, colds, coughs, and fever.  Beta blockers and ACE inhibitors are other medicines you should discuss with your health care provider. HOW CAN I FIND OUT WHAT I AM ALLERGIC TO? Ask your asthma health care provider about allergy skin testing or blood testing (the RAST test) to identify the allergens to which you are sensitive. If you are found to have allergies, the most important thing to do is to try to avoid exposure to any allergens that you are sensitive to as much as possible. Other treatments for allergies, such as medicines and allergy shots (immunotherapy) are available.  CAN I EXERCISE? Follow your health care provider's advice regarding asthma treatment before exercising. It is important to maintain a regular exercise program, but vigorous exercise, or exercise in cold, humid, or dry environments can cause asthma attacks, especially for those people who have exercise-induced asthma. Document Released: 09/25/2009 Document Revised: 06/09/2013 Document Reviewed: 04/14/2013 [facility] Patient Information 2014 [place], [place].

## 2014-04-05 NOTE — ED Notes (Signed)
Per pt sts her hand was closed in the door yesterday. Right hand pain and swelling. sts also productive cough x 3 days. sts OTC meds not working

## 2014-04-05 NOTE — ED Notes (Signed)
Patient declined wheelchair at discharge.  RN escorted patient to lobby.  

## 2014-04-06 NOTE — ED Provider Notes (Signed)
Medical screening examination/treatment/procedure(s) were performed by non-physician practitioner and as supervising physician I was immediately available for consultation/collaboration.   EKG Interpretation None        Joya Gaskinsonald W Wickline, MD 04/06/14 2021

## 2014-05-19 ENCOUNTER — Encounter (HOSPITAL_COMMUNITY): Payer: Self-pay | Admitting: Emergency Medicine

## 2014-05-19 ENCOUNTER — Emergency Department (INDEPENDENT_AMBULATORY_CARE_PROVIDER_SITE_OTHER)
Admission: EM | Admit: 2014-05-19 | Discharge: 2014-05-19 | Disposition: A | Discharge status: Home / Self Care | Payer: Medicare Other | Source: Home / Self Care | Attending: Family Medicine | Admitting: Family Medicine

## 2014-05-19 ENCOUNTER — Other Ambulatory Visit (HOSPITAL_COMMUNITY)
Admission: RE | Admit: 2014-05-19 | Discharge: 2014-05-19 | Disposition: A | Discharge status: Home / Self Care | Payer: Medicare Other | Source: Ambulatory Visit | Attending: Family Medicine | Admitting: Family Medicine

## 2014-05-19 DIAGNOSIS — Z113 Encounter for screening for infections with a predominantly sexual mode of transmission: Secondary | ICD-10-CM | POA: Diagnosis not present

## 2014-05-19 DIAGNOSIS — B9689 Other specified bacterial agents as the cause of diseases classified elsewhere: Secondary | ICD-10-CM | POA: Diagnosis not present

## 2014-05-19 DIAGNOSIS — A499 Bacterial infection, unspecified: Secondary | ICD-10-CM | POA: Diagnosis not present

## 2014-05-19 DIAGNOSIS — N760 Acute vaginitis: Secondary | ICD-10-CM | POA: Insufficient documentation

## 2014-05-19 DIAGNOSIS — Z331 Pregnant state, incidental: Secondary | ICD-10-CM

## 2014-05-19 DIAGNOSIS — J45901 Unspecified asthma with (acute) exacerbation: Secondary | ICD-10-CM | POA: Diagnosis not present

## 2014-05-19 LAB — POCT URINALYSIS DIP (DEVICE)
Bilirubin Urine: NEGATIVE
GLUCOSE, UA: NEGATIVE mg/dL
Hgb urine dipstick: NEGATIVE
Ketones, ur: NEGATIVE mg/dL
Nitrite: NEGATIVE
Protein, ur: NEGATIVE mg/dL
Specific Gravity, Urine: 1.02 (ref 1.005–1.030)
UROBILINOGEN UA: 0.2 mg/dL (ref 0.0–1.0)
pH: 6 (ref 5.0–8.0)

## 2014-05-19 LAB — POCT PREGNANCY, URINE: PREG TEST UR: POSITIVE — AB

## 2014-05-19 MED ORDER — ALBUTEROL SULFATE (2.5 MG/3ML) 0.083% IN NEBU
5.0000 mg | INHALATION_SOLUTION | Freq: Once | RESPIRATORY_TRACT | Status: AC
  Administered 2014-05-19: 5 mg via RESPIRATORY_TRACT

## 2014-05-19 MED ORDER — ALBUTEROL SULFATE (2.5 MG/3ML) 0.083% IN NEBU
INHALATION_SOLUTION | RESPIRATORY_TRACT | Status: AC
  Filled 2014-05-19: qty 3

## 2014-05-19 MED ORDER — METRONIDAZOLE 0.75 % VA GEL: 1.0000 | Freq: Every day | VAGINAL | Status: DC

## 2014-05-19 MED ORDER — ALBUTEROL SULFATE HFA 108 (90 BASE) MCG/ACT IN AERS: 2.0000 | INHALATION_SPRAY | Freq: Four times a day (QID) | RESPIRATORY_TRACT | Status: DC | PRN

## 2014-05-19 NOTE — Discharge Instructions (Signed)
See your ob doctor for prenatal care. Use medicine as prescribed.

## 2014-05-19 NOTE — ED Notes (Addendum)
Pt  Has  Two   Complaints         She  Reports  Symptoms    Of  Cough  Congestion   And  Shortness  Of      Breath          History of  asthma   Not  releived  By  Albuterol  Inhaler    Symptoms  X  3  Days             Pt   Also has  A  Vaginal  Discharge       X   3 days  As  Well

## 2014-05-19 NOTE — ED Provider Notes (Signed)
CSN: [id number]     Arrival date & time 05/19/14  1440 History   First MD Initiated Contact with Patient 05/19/14 [phone]     Chief Complaint  Patient presents with  . Shortness of Breath   (Consider location/radiation/quality/duration/timing/severity/associated sxs/prior Treatment) Patient is a 33 y.o. female presenting with shortness of breath and vaginal discharge. The history is provided by the patient.  Shortness of Breath Severity:  Moderate Onset quality:  Gradual Duration:  3 days Progression:  Unchanged Chronicity:  Recurrent Ineffective treatments:  Inhaler Associated symptoms: wheezing   Associated symptoms: no fever   Risk factors: obesity   Vaginal Discharge Quality:  Malodorous and clear Severity:  Mild Onset quality:  Gradual Duration:  3 days Progression:  Unchanged Chronicity:  New Associated symptoms: no dysuria, no fever, no genital lesions and no vaginal itching   Risk factors comment:  Lmp 6/30.   Past Medical History  Diagnosis Date  . Asthma   . Vertigo   . Acid reflux   . Obesity   . Depression     doing good  . Ovarian cyst    Past Surgical History  Procedure Laterality Date  . Cystectomy     Family History  Problem Relation Age of Onset  . Asthma Mother   . Diabetes Mother   . Heart disease Mother     CHF  . Asthma Sister   . Diabetes Sister   . Heart disease Sister   . Hearing loss Neg Hx    History  Substance Use Topics  . Smoking status: Current Every Day Smoker -- 0.25 packs/day for 10 years    Types: Cigarettes  . Smokeless tobacco: Never Used  . Alcohol Use: Yes   OB History   Grav Para Term Preterm Abortions TAB SAB Ect Mult Living   1 1 1  0 0 0 0 0 0 1     Review of Systems  Constitutional: Negative.  Negative for fever.  HENT: Negative.   Respiratory: Positive for shortness of breath and wheezing.   Genitourinary: Positive for vaginal discharge. Negative for dysuria and pelvic pain.    Allergies   Amoxicillin  Home Medications   Prior to Admission medications   Medication Sig Start Date End Date Taking? Authorizing Provider  albuterol (PROVENTIL HFA;VENTOLIN HFA) 108 (90 BASE) MCG/ACT inhaler Inhale 2 puffs into the lungs every 6 (six) hours as needed for wheezing or shortness of breath.    Historical Provider, MD  albuterol (PROVENTIL HFA;VENTOLIN HFA) 108 (90 BASE) MCG/ACT inhaler Inhale 2 puffs into the lungs every 6 (six) hours as needed for wheezing or shortness of breath. 05/19/14   Linna HoffJames D Kindl, MD  albuterol (PROVENTIL) (5 MG/ML) 0.5% nebulizer solution Take 2.5 mg by nebulization every 6 (six) hours as needed for wheezing or shortness of breath.    Historical Provider, MD  Alum & Mag Hydroxide-Simeth (MAGIC MOUTHWASH W/LIDOCAINE) SOLN Take 5 mLs by mouth 3 (three) times daily as needed for mouth pain. Swish and swallow three times daily as needed for sore throat 03/13/14   Fayrene HelperBowie Tran, PA-C  Chlorpheniramine-PSE-Ibuprofen (ADVIL ALLERGY SINUS) 2-30-200 MG TABS 1-2 tabs PO Q4-6 hrs PRN 03/17/14   Graylon GoodZachary H Baker, PA-C  clindamycin (CLEOCIN) 300 MG capsule Take 1 capsule (300 mg total) by mouth 3 (three) times daily. 03/17/14   Graylon GoodZachary H Baker, PA-C  fluconazole (DIFLUCAN) 150 MG tablet Take 1 tablet (150 mg total) by mouth once. Pick up the refill and and take second dose  in 5 days if symptoms have not resolved 03/17/14   Graylon Good, PA-C  fluticasone (FLONASE) 50 MCG/ACT nasal spray Place 2 sprays into both nostrils 2 (two) times daily. Decrease to 2 sprays/nostril daily after 5 days 03/17/14   Graylon Good, PA-C  HYDROcodone-acetaminophen (NORCO/VICODIN) 5-325 MG per tablet Take 1-2 tablets by mouth every 6 (six) hours as needed. 04/05/14   Roxy Horseman, PA-C  ipratropium (ATROVENT) 0.06 % nasal spray Place 2 sprays into both nostrils every 4 (four) hours as needed for rhinitis. 03/17/14   Adrian Blackwater Baker, PA-C  metroNIDAZOLE (METROGEL VAGINAL) 0.75 % vaginal gel Place 1  Applicatorful vaginally at bedtime. For 5 nights 05/19/14   Linna Hoff, MD  mometasone-formoterol [facility]) 200-5 MCG/ACT AERO Inhale 2 puffs into the lungs 2 (two) times daily.    Historical Provider, MD  Olopatadine HCl (PATADAY) 0.2 % SOLN 1 drop per eye once daily as needed for redness, itching, or irritation 03/17/14   Graylon Good, PA-C  polyethylene glycol (MIRALAX / GLYCOLAX) packet Take 17 g by mouth daily as needed for mild constipation.    Historical Provider, MD  predniSONE (DELTASONE) 20 MG tablet Take 2 tablets (40 mg total) by mouth daily. 04/05/14   Roxy Horseman, PA-C   LMP 04/19/2014 Physical Exam  Nursing note and vitals reviewed. Constitutional: She is oriented to person, place, and time. She appears well-developed and well-nourished. No distress.  Neck: Normal range of motion. Neck supple.  Pulmonary/Chest: She has wheezes.  Abdominal: Soft. Bowel sounds are normal.  Genitourinary: Vagina normal and uterus normal. No vaginal discharge found.  Neurological: She is alert and oriented to person, place, and time.  Skin: Skin is warm.    ED Course  Procedures (including critical care time) Labs Review Labs Reviewed  POCT URINALYSIS DIP (DEVICE) - Abnormal; Notable for the following:    Leukocytes, UA TRACE (*)    All other components within normal limits  POCT PREGNANCY, URINE - Abnormal; Notable for the following:    Preg Test, Ur POSITIVE (*)    All other components within normal limits  CERVICOVAGINAL ANCILLARY ONLY    Imaging Review No results found.   MDM   1. BV (bacterial vaginosis)   2. Asthma exacerbation       Linna Hoff, MD 05/19/14 1549

## 2014-05-22 NOTE — ED Notes (Signed)
GC/Chlamydia neg., Affirm: Candida and Trich neg., Gardnerella pos. Pt. adequately treated with Metrogel. Vassie MoselleYork, Cyle Kenyon M 05/22/2014

## 2014-06-09 ENCOUNTER — Inpatient Hospital Stay (HOSPITAL_COMMUNITY)
Admission: AD | Admit: 2014-06-09 | Discharge: 2014-06-09 | Disposition: A | Discharge status: Home / Self Care | Payer: Medicare Other | Source: Ambulatory Visit | Attending: Family Medicine | Admitting: Family Medicine

## 2014-06-09 ENCOUNTER — Inpatient Hospital Stay (HOSPITAL_COMMUNITY): Payer: Medicare Other

## 2014-06-09 ENCOUNTER — Encounter (HOSPITAL_COMMUNITY): Payer: Self-pay | Admitting: *Deleted

## 2014-06-09 DIAGNOSIS — N760 Acute vaginitis: Secondary | ICD-10-CM | POA: Diagnosis not present

## 2014-06-09 DIAGNOSIS — F3289 Other specified depressive episodes: Secondary | ICD-10-CM | POA: Diagnosis not present

## 2014-06-09 DIAGNOSIS — N831 Corpus luteum cyst of ovary, unspecified side: Secondary | ICD-10-CM | POA: Insufficient documentation

## 2014-06-09 DIAGNOSIS — A499 Bacterial infection, unspecified: Secondary | ICD-10-CM | POA: Diagnosis not present

## 2014-06-09 DIAGNOSIS — O2690 Pregnancy related conditions, unspecified, unspecified trimester: Secondary | ICD-10-CM | POA: Diagnosis not present

## 2014-06-09 DIAGNOSIS — O99330 Smoking (tobacco) complicating pregnancy, unspecified trimester: Secondary | ICD-10-CM | POA: Insufficient documentation

## 2014-06-09 DIAGNOSIS — O26899 Other specified pregnancy related conditions, unspecified trimester: Secondary | ICD-10-CM | POA: Diagnosis not present

## 2014-06-09 DIAGNOSIS — O2390 Unspecified genitourinary tract infection in pregnancy, unspecified trimester: Secondary | ICD-10-CM | POA: Diagnosis not present

## 2014-06-09 DIAGNOSIS — K219 Gastro-esophageal reflux disease without esophagitis: Secondary | ICD-10-CM | POA: Diagnosis not present

## 2014-06-09 DIAGNOSIS — O99340 Other mental disorders complicating pregnancy, unspecified trimester: Secondary | ICD-10-CM | POA: Diagnosis not present

## 2014-06-09 DIAGNOSIS — F329 Major depressive disorder, single episode, unspecified: Secondary | ICD-10-CM | POA: Diagnosis not present

## 2014-06-09 DIAGNOSIS — O34599 Maternal care for other abnormalities of gravid uterus, unspecified trimester: Secondary | ICD-10-CM | POA: Diagnosis not present

## 2014-06-09 DIAGNOSIS — R109 Unspecified abdominal pain: Secondary | ICD-10-CM | POA: Diagnosis not present

## 2014-06-09 DIAGNOSIS — B9689 Other specified bacterial agents as the cause of diseases classified elsewhere: Secondary | ICD-10-CM | POA: Insufficient documentation

## 2014-06-09 LAB — URINALYSIS, ROUTINE W REFLEX MICROSCOPIC
Bilirubin Urine: NEGATIVE
GLUCOSE, UA: NEGATIVE mg/dL
Hgb urine dipstick: NEGATIVE
KETONES UR: NEGATIVE mg/dL
Leukocytes, UA: NEGATIVE
Nitrite: NEGATIVE
Protein, ur: NEGATIVE mg/dL
Specific Gravity, Urine: 1.01 (ref 1.005–1.030)
Urobilinogen, UA: 2 mg/dL — ABNORMAL HIGH (ref 0.0–1.0)
pH: 7 (ref 5.0–8.0)

## 2014-06-09 LAB — WET PREP, GENITAL
Trich, Wet Prep: NONE SEEN
YEAST WET PREP: NONE SEEN

## 2014-06-09 LAB — CBC
HEMATOCRIT: 37 % (ref 36.0–46.0)
Hemoglobin: 12.7 g/dL (ref 12.0–15.0)
MCH: 29.4 pg (ref 26.0–34.0)
MCHC: 34.3 g/dL (ref 30.0–36.0)
MCV: 85.6 fL (ref 78.0–100.0)
Platelets: 218 10*3/uL (ref 150–400)
RBC: 4.32 MIL/uL (ref 3.87–5.11)
RDW: 13.6 % (ref 11.5–15.5)
WBC: 6.9 10*3/uL (ref 4.0–10.5)

## 2014-06-09 LAB — HCG, QUANTITATIVE, PREGNANCY: hCG, Beta Chain, Quant, S: 21547 m[IU]/mL — ABNORMAL HIGH (ref <5)

## 2014-06-09 MED ORDER — METRONIDAZOLE 0.75 % VA GEL: VAGINAL | Status: DC

## 2014-06-09 MED ORDER — METRONIDAZOLE 500 MG PO TABS: 500.0000 mg | ORAL_TABLET | Freq: Two times a day (BID) | ORAL | Status: DC

## 2014-06-09 MED ORDER — ACETAMINOPHEN 500 MG PO TABS
1000.0000 mg | ORAL_TABLET | Freq: Once | ORAL | Status: AC
  Administered 2014-06-09: 1000 mg via ORAL
  Filled 2014-06-09: qty 2

## 2014-06-09 NOTE — MAU Note (Signed)
Pain started last night. In across lower abd. Comes and goes.  Has not been seen anywhere yet with preg

## 2014-06-09 NOTE — MAU Provider Note (Signed)
History     CSN: [id number]  Arrival date and time: 06/09/14 1346   None     Chief Complaint  Patient presents with  . Abdominal Pain   HPIpt is [redacted]w[redacted]d pregnant and presents with lower abdominal pain.  Pt states that her pain comes and goes. The pain started last night and is located in lower abdomen worse on right side.  Pt denies vaginal dischare, spotting, UTI sx or constipation or diarrhea.  RN note: Pain started last night. In across lower abd. Comes and goes. Has not been seen anywhere yet with preg Pt is A POS from previous lab record  Past Medical History  Diagnosis Date  . Asthma   . Vertigo   . Acid reflux   . Obesity   . Depression     doing good  . Ovarian cyst     Past Surgical History  Procedure Laterality Date  . Cystectomy      Family History  Problem Relation Age of Onset  . Asthma Mother   . Diabetes Mother   . Heart disease Mother     CHF  . Asthma Sister   . Diabetes Sister   . Heart disease Sister   . Hearing loss Neg Hx     History  Substance Use Topics  . Smoking status: Current Every Day Smoker -- 0.25 packs/day for 10 years    Types: Cigarettes  . Smokeless tobacco: Never Used  . Alcohol Use: Yes    Allergies:  Allergies  Allergen Reactions  . Amoxicillin Hives and Swelling    Swelling in the eyes    Prescriptions prior to admission  Medication Sig Dispense Refill  . albuterol (PROVENTIL HFA;VENTOLIN HFA) 108 (90 BASE) MCG/ACT inhaler Inhale 2 puffs into the lungs every 6 (six) hours as needed for wheezing or shortness of breath.      Marland Kitchen albuterol (PROVENTIL HFA;VENTOLIN HFA) 108 (90 BASE) MCG/ACT inhaler Inhale 2 puffs into the lungs every 6 (six) hours as needed for wheezing or shortness of breath.  1 Inhaler  0  . albuterol (PROVENTIL) (5 MG/ML) 0.5% nebulizer solution Take 2.5 mg by nebulization every 6 (six) hours as needed for wheezing or shortness of breath.      . Alum & Mag Hydroxide-Simeth (MAGIC MOUTHWASH  W/LIDOCAINE) SOLN Take 5 mLs by mouth 3 (three) times daily as needed for mouth pain. Swish and swallow three times daily as needed for sore throat  60 mL  0  . Chlorpheniramine-PSE-Ibuprofen (ADVIL ALLERGY SINUS) 2-30-200 MG TABS 1-2 tabs PO Q4-6 hrs PRN  30 each  1  . clindamycin (CLEOCIN) 300 MG capsule Take 1 capsule (300 mg total) by mouth 3 (three) times daily.  21 capsule  0  . fluconazole (DIFLUCAN) 150 MG tablet Take 1 tablet (150 mg total) by mouth once. Pick up the refill and and take second dose in 5 days if symptoms have not resolved  1 tablet  1  . fluticasone (FLONASE) 50 MCG/ACT nasal spray Place 2 sprays into both nostrils 2 (two) times daily. Decrease to 2 sprays/nostril daily after 5 days  16 g  2  . HYDROcodone-acetaminophen (NORCO/VICODIN) 5-325 MG per tablet Take 1-2 tablets by mouth every 6 (six) hours as needed.  10 tablet  0  . ipratropium (ATROVENT) 0.06 % nasal spray Place 2 sprays into both nostrils every 4 (four) hours as needed for rhinitis.  15 mL  1  . metroNIDAZOLE (METROGEL VAGINAL) 0.75 % vaginal gel Place  1 Applicatorful vaginally at bedtime. For 5 nights  70 g  0  . mometasone-formoterol (DULERA) 200-5 MCG/ACT AERO Inhale 2 puffs into the lungs 2 (two) times daily.      . Olopatadine HCl (PATADAY) 0.2 % SOLN 1 drop per eye once daily as needed for redness, itching, or irritation  2.5 mL  0  . polyethylene glycol (MIRALAX / GLYCOLAX) packet Take 17 g by mouth daily as needed for mild constipation.      . predniSONE (DELTASONE) 20 MG tablet Take 2 tablets (40 mg total) by mouth daily.  10 tablet  0    Review of Systems  Constitutional: Negative for fever and chills.  Gastrointestinal: Positive for abdominal pain and constipation. Negative for nausea, vomiting and diarrhea.  Genitourinary: Negative for dysuria and urgency.   Physical Exam   Last menstrual period 04/19/2014.  Physical Exam  Nursing note and vitals reviewed. Constitutional: She is oriented to  person, place, and time. She appears well-developed and well-nourished. No distress.  HENT:  Head: Normocephalic.  Eyes: Pupils are equal, round, and reactive to light.  Neck: Normal range of motion. Neck supple.  Cardiovascular: Normal rate.   Respiratory: Effort normal.  GI: Soft. She exhibits no distension. There is no tenderness. There is no rebound and no guarding.  Genitourinary:  Mod amount of frothy white discharge in vault; cervix clean, NT; uterus 6-8 week size, NT; right adnexa tenderness- no rebound- left adnexa without palpable tenderness or enlargement  Musculoskeletal: Normal range of motion.  Neurological: She is alert and oriented to person, place, and time.  Skin: Skin is warm and dry.  Psychiatric: She has a normal mood and affect.    MAU Course  Procedures Results for orders placed during the hospital encounter of 06/09/14 (from the past 24 hour(s))  URINALYSIS, ROUTINE W REFLEX MICROSCOPIC     Status: Abnormal   Collection Time    06/09/14  2:48 PM      Result Value Ref Range   Color, Urine YELLOW  YELLOW   APPearance CLEAR  CLEAR   Specific Gravity, Urine 1.010  1.005 - 1.030   pH 7.0  5.0 - 8.0   Glucose, UA NEGATIVE  NEGATIVE mg/dL   Hgb urine dipstick NEGATIVE  NEGATIVE   Bilirubin Urine NEGATIVE  NEGATIVE   Ketones, ur NEGATIVE  NEGATIVE mg/dL   Protein, ur NEGATIVE  NEGATIVE mg/dL   Urobilinogen, UA 2.0 (*) 0.0 - 1.0 mg/dL   Nitrite NEGATIVE  NEGATIVE   Leukocytes, UA NEGATIVE  NEGATIVE  CBC     Status: None   Collection Time    06/09/14  4:40 PM      Result Value Ref Range   WBC 6.9  4.0 - 10.5 K/uL   RBC 4.32  3.87 - 5.11 MIL/uL   Hemoglobin 12.7  12.0 - 15.0 g/dL   HCT 16.137.0  09.636.0 - 04.546.0 %   MCV 85.6  78.0 - 100.0 fL   MCH 29.4  26.0 - 34.0 pg   MCHC 34.3  30.0 - 36.0 g/dL   RDW 40.913.6  81.111.5 - 91.415.5 %   Platelets 218  150 - 400 K/uL  HCG, QUANTITATIVE, PREGNANCY     Status: Abnormal   Collection Time    06/09/14  4:40 PM      Result Value  Ref Range   hCG, Beta Chain, Quant, S [id number] (*) <5 mIU/mL  WET PREP, GENITAL     Status: Abnormal   Collection  Time    06/09/14  7:46 PM      Result Value Ref Range   Yeast Wet Prep HPF POC NONE SEEN  NONE SEEN   Trich, Wet Prep NONE SEEN  NONE SEEN   Clue Cells Wet Prep HPF POC MODERATE (*) NONE SEEN   WBC, Wet Prep HPF POC FEW (*) NONE SEEN  US Ob Comp Less 14 Wks  06/09/2014   CLINICAL DATA:  Pain since last night at lower abdomen, pregnant, EGA [redacted] weeks 2 days by LMP  EXAM: OBSTETRIC <14 WK [place] AND TRANSVAGINAL OB US  TECHNIQUE: Both transabdominal and transvaginal ultrasound examinations were performed for complete evaluation of the gestation as well as the maternal uterus, adnexal regions, and pelvic cul-de-sac. Transvaginal technique was performed to assess early pregnancy.  COMPARISON:  None for this gestational  FINDINGS: Intrauterine gestational sac: Visualized/normal in shape.  Yolk sac:  Present  Embryo:  Present  Cardiac Activity: Present  Heart Rate:  154 bpm  CRL:   12.9  mm   7 w  4 d                  [place] EDC: 01/21/2014  Maternal uterus/adnexae:  No subchorionic hemorrhage.  RIGHT ovary measures 3.3 x 3.2 x 3.1 cm and contains a corpus luteal cyst.  LEFT ovary normal size and morphology, 2.1 x 1.5 x 1.9 cm.  No adnexal masses or free pelvic fluid.  IMPRESSION: Single live intrauterine gestational measured at 7 weeks 4 days EGA by crown-rump length, correlating well with EGA based on LMP.  No acute abnormalities.   Electronically Signed   By: Ulyses Southward M.D.   On: 06/09/2014 16:45   US Ob Transvaginal  06/09/2014   CLINICAL DATA:  Pain since last night at lower abdomen, pregnant, EGA [redacted] weeks 2 days by LMP  EXAM: OBSTETRIC <14 WK [place] AND TRANSVAGINAL OB US  TECHNIQUE: Both transabdominal and transvaginal ultrasound examinations were performed for complete evaluation of the gestation as well as the maternal uterus, adnexal regions, and pelvic cul-de-sac. Transvaginal technique was performed  to assess early pregnancy.  COMPARISON:  None for this gestational  FINDINGS: Intrauterine gestational sac: Visualized/normal in shape.  Yolk sac:  Present  Embryo:  Present  Cardiac Activity: Present  Heart Rate:  154 bpm  CRL:   12.9  mm   7 w  4 d                  [place] EDC: 01/21/2014  Maternal uterus/adnexae:  No subchorionic hemorrhage.  RIGHT ovary measures 3.3 x 3.2 x 3.1 cm and contains a corpus luteal cyst.  LEFT ovary normal size and morphology, 2.1 x 1.5 x 1.9 cm.  No adnexal masses or free pelvic fluid.  IMPRESSION: Single live intrauterine gestational measured at 7 weeks 4 days EGA by crown-rump length, correlating well with EGA based on LMP.  No acute abnormalities.   Electronically Signed   By: Ulyses Southward M.D.   On: 06/09/2014 16:45      Assessment and Plan  Abdominal pain in pregnancy- right CLC Viable IUP [redacted]w[redacted]d BV- Flagyl 500 mg BID for 7 days F/u with prenatal care  Wednesday Ericsson 06/09/2014, 3:42 PM

## 2014-06-10 LAB — GC/CHLAMYDIA PROBE AMP
CT Probe RNA: NEGATIVE
GC PROBE AMP APTIMA: NEGATIVE

## 2014-06-10 NOTE — MAU Provider Note (Signed)
Attestation of Attending Supervision of Advanced Practitioner (PA/CNM/NP): Evaluation and management procedures were performed by the Advanced Practitioner under my supervision and collaboration.  I have reviewed the Advanced Practitioner's note and chart, and I agree with the management and plan.  Reva BoresPRATT,TANYA S, MD [facility] Attending 06/10/2014 8:39 AM

## 2014-06-16 DIAGNOSIS — F3289 Other specified depressive episodes: Secondary | ICD-10-CM | POA: Diagnosis not present

## 2014-06-16 DIAGNOSIS — F329 Major depressive disorder, single episode, unspecified: Secondary | ICD-10-CM | POA: Diagnosis not present

## 2014-06-16 DIAGNOSIS — D649 Anemia, unspecified: Secondary | ICD-10-CM | POA: Diagnosis not present

## 2014-06-16 DIAGNOSIS — Z124 Encounter for screening for malignant neoplasm of cervix: Secondary | ICD-10-CM | POA: Diagnosis not present

## 2014-06-16 DIAGNOSIS — Z3480 Encounter for supervision of other normal pregnancy, unspecified trimester: Secondary | ICD-10-CM | POA: Diagnosis not present

## 2014-06-16 DIAGNOSIS — F17200 Nicotine dependence, unspecified, uncomplicated: Secondary | ICD-10-CM | POA: Diagnosis not present

## 2014-06-16 DIAGNOSIS — Z23 Encounter for immunization: Secondary | ICD-10-CM | POA: Diagnosis not present

## 2014-06-16 DIAGNOSIS — J45909 Unspecified asthma, uncomplicated: Secondary | ICD-10-CM | POA: Diagnosis not present

## 2014-06-16 LAB — OB RESULTS CONSOLE RUBELLA ANTIBODY, IGM: Rubella: NON-IMMUNE/NOT IMMUNE

## 2014-06-16 LAB — OB RESULTS CONSOLE GC/CHLAMYDIA
Chlamydia: NEGATIVE
Gonorrhea: NEGATIVE

## 2014-06-16 LAB — OB RESULTS CONSOLE HEPATITIS B SURFACE ANTIGEN: Hepatitis B Surface Ag: NEGATIVE

## 2014-06-16 LAB — OB RESULTS CONSOLE RPR: RPR: NONREACTIVE

## 2014-06-16 LAB — OB RESULTS CONSOLE HIV ANTIBODY (ROUTINE TESTING): HIV: NONREACTIVE

## 2014-06-16 LAB — OB RESULTS CONSOLE ANTIBODY SCREEN: ANTIBODY SCREEN: NEGATIVE

## 2014-06-16 LAB — OB RESULTS CONSOLE ABO/RH: RH TYPE: POSITIVE

## 2014-06-23 ENCOUNTER — Encounter (HOSPITAL_COMMUNITY): Payer: Self-pay | Admitting: Emergency Medicine

## 2014-06-23 ENCOUNTER — Emergency Department (HOSPITAL_COMMUNITY)
Admission: EM | Admit: 2014-06-23 | Discharge: 2014-06-23 | Disposition: A | Discharge status: Home / Self Care | Payer: Medicare Other | Attending: Emergency Medicine | Admitting: Emergency Medicine

## 2014-06-23 DIAGNOSIS — R519 Headache, unspecified: Secondary | ICD-10-CM

## 2014-06-23 DIAGNOSIS — Z8719 Personal history of other diseases of the digestive system: Secondary | ICD-10-CM | POA: Insufficient documentation

## 2014-06-23 DIAGNOSIS — R059 Cough, unspecified: Secondary | ICD-10-CM | POA: Insufficient documentation

## 2014-06-23 DIAGNOSIS — R05 Cough: Secondary | ICD-10-CM | POA: Diagnosis present

## 2014-06-23 DIAGNOSIS — J45901 Unspecified asthma with (acute) exacerbation: Secondary | ICD-10-CM | POA: Insufficient documentation

## 2014-06-23 DIAGNOSIS — Z79899 Other long term (current) drug therapy: Secondary | ICD-10-CM | POA: Diagnosis not present

## 2014-06-23 DIAGNOSIS — Z8659 Personal history of other mental and behavioral disorders: Secondary | ICD-10-CM | POA: Diagnosis not present

## 2014-06-23 DIAGNOSIS — E669 Obesity, unspecified: Secondary | ICD-10-CM | POA: Insufficient documentation

## 2014-06-23 DIAGNOSIS — R112 Nausea with vomiting, unspecified: Secondary | ICD-10-CM | POA: Insufficient documentation

## 2014-06-23 DIAGNOSIS — R51 Headache: Secondary | ICD-10-CM

## 2014-06-23 DIAGNOSIS — Z880 Allergy status to penicillin: Secondary | ICD-10-CM | POA: Insufficient documentation

## 2014-06-23 DIAGNOSIS — O210 Mild hyperemesis gravidarum: Secondary | ICD-10-CM | POA: Diagnosis not present

## 2014-06-23 DIAGNOSIS — J069 Acute upper respiratory infection, unspecified: Secondary | ICD-10-CM | POA: Diagnosis not present

## 2014-06-23 DIAGNOSIS — Z8742 Personal history of other diseases of the female genital tract: Secondary | ICD-10-CM | POA: Insufficient documentation

## 2014-06-23 DIAGNOSIS — R110 Nausea: Secondary | ICD-10-CM

## 2014-06-23 DIAGNOSIS — F17200 Nicotine dependence, unspecified, uncomplicated: Secondary | ICD-10-CM | POA: Insufficient documentation

## 2014-06-23 MED ORDER — ONDANSETRON 4 MG PO TBDP: 4.0000 mg | ORAL_TABLET | Freq: Three times a day (TID) | ORAL | Status: DC | PRN

## 2014-06-23 MED ORDER — ALBUTEROL SULFATE (2.5 MG/3ML) 0.083% IN NEBU
5.0000 mg | INHALATION_SOLUTION | Freq: Once | RESPIRATORY_TRACT | Status: AC
  Administered 2014-06-23: 5 mg via RESPIRATORY_TRACT
  Filled 2014-06-23: qty 6

## 2014-06-23 MED ORDER — ACETAMINOPHEN 325 MG PO TABS
650.0000 mg | ORAL_TABLET | Freq: Once | ORAL | Status: AC
  Administered 2014-06-23: 650 mg via ORAL
  Filled 2014-06-23: qty 2

## 2014-06-23 MED ORDER — IPRATROPIUM BROMIDE 0.02 % IN SOLN
0.5000 mg | Freq: Once | RESPIRATORY_TRACT | Status: AC
  Administered 2014-06-23: 0.5 mg via RESPIRATORY_TRACT
  Filled 2014-06-23: qty 2.5

## 2014-06-23 MED ORDER — ALBUTEROL SULFATE HFA 108 (90 BASE) MCG/ACT IN AERS
2.0000 | INHALATION_SPRAY | RESPIRATORY_TRACT | Status: DC | PRN
  Administered 2014-06-23: 2 via RESPIRATORY_TRACT
  Filled 2014-06-23: qty 6.7

## 2014-06-23 MED ORDER — ONDANSETRON 4 MG PO TBDP
8.0000 mg | ORAL_TABLET | Freq: Once | ORAL | Status: AC
  Administered 2014-06-23: 8 mg via ORAL
  Filled 2014-06-23: qty 2

## 2014-06-23 NOTE — ED Provider Notes (Signed)
Medical screening examination/treatment/procedure(s) were performed by non-physician practitioner and as supervising physician I was immediately available for consultation/collaboration.   EKG Interpretation None       Kismet Facemire L Mikal Wisman, MD 06/23/14 2007 

## 2014-06-23 NOTE — ED Notes (Addendum)
Pt here c/o URI sx with cough and congestion; pt sts sore throat; denies fever; pt sts pregnant with LMP 04/19/14; pt denies any complaint of pain or discharge

## 2014-06-23 NOTE — ED Provider Notes (Signed)
CSN: [id number]     Arrival date & time 06/23/14  0757 History   First MD Initiated Contact with Patient 06/23/14 [phone]     Chief Complaint  Patient presents with  . URI  . Cough  . Sore Throat     (Consider location/radiation/quality/duration/timing/severity/associated sxs/prior Treatment) HPI Comments: Patient who is pregnant with history of asthma, presents with complaint of cough, nasal congestion, headache for one day. Patient has had severe paroxysms of cough which lead to vomiting. She states that she feels nauseous as well due to her pregnancy. She denies chest pain or hemoptysis. She denies ear pain or sore throat. She has not had a fever. Patient took over-the-counter sinus medication without relief prior to arrival. No abdominal pain or urinary symptoms. The onset of this condition was acute. The course is constant. Aggravating factors: none. Alleviating factors: none.    Patient is a 33 y.o. female presenting with URI, cough, and pharyngitis. The history is provided by the patient.  URI Presenting symptoms: congestion, cough and rhinorrhea   Presenting symptoms: no ear pain, no fatigue, no fever and no sore throat   Associated symptoms: headaches   Associated symptoms: no myalgias and no wheezing   Cough Associated symptoms: headaches and rhinorrhea   Associated symptoms: no chest pain, no chills, no ear pain, no fever, no myalgias, no rash, no shortness of breath, no sore throat and no wheezing   Sore Throat Associated symptoms include congestion, coughing, headaches, nausea and vomiting. Pertinent negatives include no abdominal pain, chest pain, chills, fatigue, fever, myalgias, rash or sore throat.    Past Medical History  Diagnosis Date  . Asthma   . Vertigo   . Acid reflux   . Obesity   . Depression     doing good  . Ovarian cyst    Past Surgical History  Procedure Laterality Date  . Cyst excision Left 1987   Family History  Problem Relation Age of Onset  .  Asthma Mother   . Diabetes Mother   . Heart disease Mother     CHF  . Asthma Sister   . Diabetes Sister   . Heart disease Sister   . Hearing loss Neg Hx    History  Substance Use Topics  . Smoking status: Current Every Day Smoker -- 0.25 packs/day for 10 years    Types: Cigarettes  . Smokeless tobacco: Never Used  . Alcohol Use: Yes   OB History   Grav Para Term Preterm Abortions TAB SAB Ect Mult Living   0 0 0 0 0 0 1     Review of Systems  Constitutional: Negative for fever, chills and fatigue.  HENT: Positive for congestion, rhinorrhea and sinus pressure. Negative for ear pain and sore throat.   Eyes: Negative for redness.  Respiratory: Positive for cough. Negative for shortness of breath and wheezing.   Cardiovascular: Negative for chest pain.  Gastrointestinal: Positive for nausea and vomiting. Negative for abdominal pain and diarrhea.  Genitourinary: Negative for dysuria.  Musculoskeletal: Negative for myalgias and neck stiffness.  Skin: Negative for rash.  Neurological: Positive for headaches.  Hematological: Negative for adenopathy.   Allergies  Amoxicillin  Home Medications   Prior to Admission medications   Medication Sig Start Date End Date Taking? Authorizing Provider  albuterol (PROVENTIL HFA;VENTOLIN HFA) 108 (90 BASE) MCG/ACT inhaler Inhale 2 puffs into the lungs every 6 (six) hours as needed for wheezing or shortness of breath. 05/19/14   Fayrene Fearing  Sallyanne Kuster, MD  albuterol (PROVENTIL) (5 MG/ML) 0.5% nebulizer solution Take 2.5 mg by nebulization every 6 (six) hours as needed for wheezing or shortness of breath.    Historical Provider, MD  cetirizine (ZYRTEC) 10 MG tablet Take 10 mg by mouth daily.    Historical Provider, MD  metroNIDAZOLE (METROGEL VAGINAL) 0.75 % vaginal gel 1 applicatorful at bedtime 06/09/14   Jean Rosenthal, NP   BP 124/84  Pulse 94  Temp(Src) 98.3 F (36.8 C) (Oral)  Resp 22  Ht  (1.651 m)  Wt 259 lb (117.482 kg)  BMI  43.10 kg/m2  SpO2 100%  LMP 04/19/2014  Physical Exam  Nursing note and vitals reviewed. Constitutional: She appears well-developed and well-nourished.  HENT:  Head: Normocephalic and atraumatic.  Right Ear: Tympanic membrane, external ear and ear canal normal.  Left Ear: Tympanic membrane, external ear and ear canal normal.  Nose: Mucosal edema and rhinorrhea present. No sinus tenderness.  Mouth/Throat: Uvula is midline, oropharynx is clear and moist and mucous membranes are normal. No oropharyngeal exudate, posterior oropharyngeal edema or posterior oropharyngeal erythema.  Eyes: Conjunctivae are normal. Right eye exhibits no discharge. Left eye exhibits no discharge.  Neck: Normal range of motion. Neck supple.  Cardiovascular: Normal rate, regular rhythm and normal heart sounds.   No murmur heard. Pulmonary/Chest: Effort normal. No respiratory distress. She has wheezes (slight scattered expiratory wheezing). She has no rales.  Abdominal: Soft. There is no tenderness.  Neurological: She is alert.  Skin: Skin is warm and dry.  Psychiatric: She has a normal mood and affect.    ED Course  Procedures (including critical care time) Labs Review Labs Reviewed - No data to display  Imaging Review No results found.   EKG Interpretation None      8:33 AM Patient seen and examined. Work-up initiated. Medications ordered.   Vital signs reviewed and are as follows: BP 124/84  Pulse 94  Temp(Src) 98.3 F (36.8 C) (Oral)  Resp 22  Ht  (1.651 m)  Wt 259 lb (117.482 kg)  BMI 43.10 kg/m2  SpO2 100%  LMP 04/19/2014  9:35 AM patient much improved after treatment. Cough improved, wheezing resolved. Headache improved with Tylenol. Patient has albuterol nebulizer at home. She requests albuterol inhaler.  Will discharge to home with Zofran. Patient counseled on supportive care for viral URI and s/s to return including worsening symptoms, persistent fever, persistent vomiting, or  if they have any other concerns. Urged to see PCP if symptoms persist for more than 3 days. Patient verbalizes understanding and agrees with plan.     MDM   Final diagnoses:  URI (upper respiratory infection)  Nausea  Acute nonintractable headache, unspecified headache type   Patient with upper respiratory infection symptoms. Suspect the cough is due to postnasal drip. No fever or other abnormalities on exam to suggest pneumonia. Patient with posttussive emesis. Cannot rule out emesis to pregnancy. She does not have any abdominal pain. Patient treated in emergency department with improvement.  No dangerous or life-threatening conditions suspected or identified by history, physical exam, and by work-up. No indications for hospitalization identified.      Renne Crigler, PA-C 06/23/14 [phone]

## 2014-06-23 NOTE — Discharge Instructions (Signed)
Please read and follow all provided instructions.  Your diagnoses today include:  1. URI (upper respiratory infection)   2. Nausea   3. Acute nonintractable headache, unspecified headache type     You appear to have an upper respiratory infection (URI). An upper respiratory tract infection, or cold, is a viral infection of the air passages leading to the lungs. It should improve gradually after 5-7 days. You may have a lingering cough that lasts for 2- 4 weeks after the infection.  Tests performed today include:  Vital signs. See below for your results today.   Medications prescribed:   Zofran (ondansetron) - for nausea and vomiting   Albuterol inhaler - medication that opens up your airway  Use inhaler as follows: 1-2 puffs with spacer every 4 hours as needed for wheezing, cough, or shortness of breath.   Take any prescribed medications only as directed. Treatment for your infection is aimed at treating the symptoms. There are no medications, such as antibiotics, that will cure your infection.   Home care instructions:  Follow any educational materials contained in this packet.   Your illness is contagious and can be spread to others, especially during the first 3 or 4 days. It cannot be cured by antibiotics or other medicines. Take basic precautions such as washing your hands often, covering your mouth when you cough or sneeze, and avoiding public places where you could spread your illness to others.   Please continue drinking plenty of fluids.  Use over-the-counter medicines as needed as directed on packaging for symptom relief.  You may also use ibuprofen or tylenol as directed on packaging for pain or fever.  Do not take multiple medicines containing Tylenol or acetaminophen to avoid taking too much of this medication.  Follow-up instructions: Please follow-up with your primary care provider in the next 3 days for further evaluation of your symptoms if you are not feeling  better.   Return instructions:   Please return to the Emergency Department if you experience worsening symptoms.   RETURN IMMEDIATELY IF you develop shortness of breath, confusion or altered mental status, a new rash, become dizzy, faint, or poorly responsive, or are unable to be cared for at home.  Please return if you have persistent vomiting and cannot keep down fluids or develop a fever that is not controlled by tylenol or motrin.    Please return if you have any other emergent concerns.  Additional Information:  Your vital signs today were: BP 142/63   Pulse 87   Temp(Src) 98.1 F (36.7 C) (Oral)   Resp 22   Ht  (1.651 m)   Wt 259 lb (117.482 kg)   BMI 43.10 kg/m2   SpO2 100%   LMP 04/19/2014 If your blood pressure (BP) was elevated above 135/85 this visit, please have this repeated by your doctor within one month. --------------

## 2014-07-01 ENCOUNTER — Ambulatory Visit (INDEPENDENT_AMBULATORY_CARE_PROVIDER_SITE_OTHER): Payer: Medicare Other | Admitting: Emergency Medicine

## 2014-07-01 ENCOUNTER — Encounter: Payer: Self-pay | Admitting: Emergency Medicine

## 2014-07-01 VITALS — BP 124/80 | HR 78 | Ht 62.0 in | Wt 263.2 lb

## 2014-07-01 DIAGNOSIS — F17200 Nicotine dependence, unspecified, uncomplicated: Secondary | ICD-10-CM | POA: Insufficient documentation

## 2014-07-01 DIAGNOSIS — J309 Allergic rhinitis, unspecified: Secondary | ICD-10-CM

## 2014-07-01 DIAGNOSIS — J45909 Unspecified asthma, uncomplicated: Secondary | ICD-10-CM | POA: Diagnosis not present

## 2014-07-01 DIAGNOSIS — J4520 Mild intermittent asthma, uncomplicated: Secondary | ICD-10-CM

## 2014-07-01 DIAGNOSIS — K219 Gastro-esophageal reflux disease without esophagitis: Secondary | ICD-10-CM | POA: Diagnosis not present

## 2014-07-01 MED ORDER — FLUTICASONE PROPIONATE 50 MCG/ACT NA SUSP: 2.0000 | Freq: Two times a day (BID) | NASAL | Status: DC

## 2014-07-01 MED ORDER — AEROCHAMBER MV MISC: Status: DC

## 2014-07-01 NOTE — Patient Instructions (Signed)
Please start Asmanex 100, 2 puffs twice a day through a spacer.  Continue to use your albuterol 2 puffs up to every 4 hours if needed for shortness of breath or wheezing Start fluticasone nasal spray, 2 sprays each nostril 1 -2 times a day  Continue your zyrtec daily Continue to use Tums. We can consider adding a GERD medication further along in your pregnancy, but not now We will check full breathing tests next year  Follow with Dr Delton Coombes in 6 - weeks or sooner if you have any problems

## 2014-07-01 NOTE — Assessment & Plan Note (Signed)
-   unfortunately no good choices for rx at [redacted] weeks gestation. Will continue Tums for now and then add an alternative in several weeks.

## 2014-07-01 NOTE — Assessment & Plan Note (Signed)
-   continue zyrtec - add nasal steroid > fluticasone

## 2014-07-01 NOTE — Progress Notes (Signed)
Subjective:    Patient ID: Stacy Blackwell, female    DOB: 22-Aug-1981, 33 y.o.   MRN: [id number]  HPI 33 yo woman, smoker (5 pk-yrs). She is G2P1 [redacted] weeks pregnant with hx of GERD, depression. She has asthma that was dx in her teens. She has been managed at times with LABA/ICS or ICS alone. Her sx seem to wax/wane with the seasons and her allergies. For the last 6 months she has not needed a scheduled medication, has not needed daily albuterol.   Her sx have been worse for the last 10 weeks, since she has been pregnant. She is having more GERD as well. Was just given pred x 5 days for refractory SOB and wheezing, cough with brownish sputum. She has improved some now > sputum better.   Triggers = allergy seasons both Fall and Spring, dusts, many allergens grasses, trees, pets, etc. GERD.    Review of Systems  Constitutional: Negative for fever and unexpected weight change.  HENT: Negative for congestion, dental problem, ear pain, nosebleeds, postnasal drip, rhinorrhea, sinus pressure, sneezing, sore throat and trouble swallowing.   Eyes: Negative for redness and itching.  Respiratory: Positive for cough ( with brown mucus production ) and wheezing. Negative for chest tightness and shortness of breath.   Cardiovascular: Negative for palpitations and leg swelling.  Gastrointestinal: Negative for nausea and vomiting.  Genitourinary: Negative for dysuria.  Musculoskeletal: Negative for joint swelling.  Skin: Negative for rash.  Neurological: Negative for headaches.  Hematological: Does not bruise/bleed easily.  Psychiatric/Behavioral: Negative for dysphoric mood. The patient is not nervous/anxious.    Past Medical History  Diagnosis Date  . Asthma   . Vertigo   . Acid reflux   . Obesity   . Depression     doing good  . Ovarian cyst      Family History  Problem Relation Age of Onset  . Asthma Mother   . Diabetes Mother   . Heart disease Mother     CHF  . Asthma Sister   .  Diabetes Sister   . Heart disease Sister   . Hearing loss Neg Hx      History   Social History  . Marital Status: Single    Spouse Name: N/A    Number of Children: N/A  . Years of Education: N/A   Occupational History  . unemployed    Social History Main Topics  . Smoking status: Current Every Day Smoker -- 0.50 packs/day for 10 years    Types: Cigarettes  . Smokeless tobacco: Never Used  . Alcohol Use: No  . Drug Use: No  . Sexual Activity: Yes    Birth Control/ Protection: None   Other Topics Concern  . Not on file   Social History Narrative  . No narrative on file     Allergies  Allergen Reactions  . Amoxicillin Hives and Swelling    Swelling in the eyes     Outpatient Prescriptions Prior to Visit  Medication Sig Dispense Refill  . albuterol (PROVENTIL HFA;VENTOLIN HFA) 108 (90 BASE) MCG/ACT inhaler Inhale 2 puffs into the lungs every 6 (six) hours as needed for wheezing or shortness of breath.  1 Inhaler  0  . albuterol (PROVENTIL) (5 MG/ML) 0.5% nebulizer solution Take 2.5 mg by nebulization every 6 (six) hours as needed for wheezing or shortness of breath.      . cetirizine (ZYRTEC) 10 MG tablet Take 10 mg by mouth daily.      Marland Kitchen  ondansetron (ZOFRAN ODT) 4 MG disintegrating tablet Take 1 tablet (4 mg total) by mouth every 8 (eight) hours as needed for nausea or vomiting.  10 tablet  0   No facility-administered medications prior to visit.         Objective:   Physical Exam Filed Vitals:   07/01/14 0901  BP: 124/80  Pulse: 78  Height:  (1.575 m)  Weight: 263 lb 3.2 oz (119.387 kg)  SpO2: 98%   Gen: Pleasant, obese woman, in no distress,  normal affect  ENT: No lesions,  mouth clear,  oropharynx clear but narrow with large tonsils, no postnasal drip  Neck: No JVD, no TMG, no carotid bruits  Lungs: No use of accessory muscles, clear without rales or rhonchi  Cardiovascular: RRR, heart sounds normal, no murmur or gallops, no peripheral  edema  Musculoskeletal: No deformities, no cyanosis or clubbing  Neuro: alert, non focal  Skin: Warm, no lesions or rashes      Assessment & Plan:  Tobacco use disorder Will need to work on cessation. Will continue to discuss with her  Intrinsic asthma Dx in her teens. No immediately available PFT. Now with poorer control in setting pregnancy, increased GERD, allergy season.  - will start scheduled ICS > asmanex bid - albuterol prn - will perform PFT after her baby is born - rov 6-8 weeks  GERD (gastroesophageal reflux disease) - unfortunately no good choices for rx at [redacted] weeks gestation. Will continue Tums for now and then add an alternative in several weeks.   Allergic rhinitis - continue zyrtec - add nasal steroid > fluticasone

## 2014-07-01 NOTE — Assessment & Plan Note (Signed)
Will need to work on cessation. Will continue to discuss with her

## 2014-07-01 NOTE — Addendum Note (Signed)
Addended by: Maisie Fus on: 07/01/2014 10:03 AM   Modules accepted: Orders

## 2014-07-01 NOTE — Assessment & Plan Note (Signed)
Dx in her teens. No immediately available PFT. Now with poorer control in setting pregnancy, increased GERD, allergy season.  - will start scheduled ICS > asmanex bid - albuterol prn - will perform PFT after her baby is born - rov 6-8 weeks

## 2014-07-13 ENCOUNTER — Other Ambulatory Visit (HOSPITAL_COMMUNITY): Payer: Self-pay | Admitting: Physician Assistant

## 2014-07-13 DIAGNOSIS — Z3682 Encounter for antenatal screening for nuchal translucency: Secondary | ICD-10-CM

## 2014-07-14 ENCOUNTER — Encounter (HOSPITAL_COMMUNITY): Payer: Self-pay | Admitting: *Deleted

## 2014-07-14 ENCOUNTER — Inpatient Hospital Stay (HOSPITAL_COMMUNITY)
Admission: AD | Admit: 2014-07-14 | Discharge: 2014-07-14 | Disposition: A | Discharge status: Home / Self Care | Payer: Medicare Other | Source: Ambulatory Visit | Attending: Obstetrics & Gynecology | Admitting: Obstetrics & Gynecology

## 2014-07-14 DIAGNOSIS — F329 Major depressive disorder, single episode, unspecified: Secondary | ICD-10-CM | POA: Diagnosis not present

## 2014-07-14 DIAGNOSIS — M62838 Other muscle spasm: Secondary | ICD-10-CM | POA: Diagnosis not present

## 2014-07-14 DIAGNOSIS — O99891 Other specified diseases and conditions complicating pregnancy: Secondary | ICD-10-CM | POA: Diagnosis not present

## 2014-07-14 DIAGNOSIS — F17200 Nicotine dependence, unspecified, uncomplicated: Secondary | ICD-10-CM | POA: Insufficient documentation

## 2014-07-14 DIAGNOSIS — F3289 Other specified depressive episodes: Secondary | ICD-10-CM | POA: Insufficient documentation

## 2014-07-14 DIAGNOSIS — M25559 Pain in unspecified hip: Secondary | ICD-10-CM | POA: Diagnosis not present

## 2014-07-14 DIAGNOSIS — K219 Gastro-esophageal reflux disease without esophagitis: Secondary | ICD-10-CM | POA: Insufficient documentation

## 2014-07-14 DIAGNOSIS — O9989 Other specified diseases and conditions complicating pregnancy, childbirth and the puerperium: Principal | ICD-10-CM

## 2014-07-14 LAB — URINALYSIS, ROUTINE W REFLEX MICROSCOPIC
Bilirubin Urine: NEGATIVE
Glucose, UA: NEGATIVE mg/dL
HGB URINE DIPSTICK: NEGATIVE
Ketones, ur: NEGATIVE mg/dL
Leukocytes, UA: NEGATIVE
NITRITE: NEGATIVE
Protein, ur: NEGATIVE mg/dL
UROBILINOGEN UA: 0.2 mg/dL (ref 0.0–1.0)
pH: 6.5 (ref 5.0–8.0)

## 2014-07-14 MED ORDER — CYCLOBENZAPRINE HCL 10 MG PO TABS: 10.0000 mg | ORAL_TABLET | Freq: Two times a day (BID) | ORAL | Status: DC | PRN

## 2014-07-14 MED ORDER — HYDROMORPHONE HCL 1 MG/ML IJ SOLN
1.0000 mg | Freq: Once | INTRAMUSCULAR | Status: AC
  Administered 2014-07-14: 1 mg via INTRAMUSCULAR
  Filled 2014-07-14: qty 1

## 2014-07-14 MED ORDER — CYCLOBENZAPRINE HCL 10 MG PO TABS
10.0000 mg | ORAL_TABLET | Freq: Once | ORAL | Status: AC
  Administered 2014-07-14: 10 mg via ORAL
  Filled 2014-07-14: qty 1

## 2014-07-14 NOTE — Discharge Instructions (Signed)
Heat Therapy °Heat therapy can help make painful, stiff muscles and joints feel better. Do not use heat on new injuries. Wait at least 48 hours after an injury to use heat. Do not use heat when you have aches or pains right after an activity. If you still have pain 3 hours after stopping the activity, then you may use heat. °HOME CARE °Wet heat pack °· Soak a clean towel in warm water. Squeeze out the extra water. °· Put the warm, wet towel in a plastic bag. °· Place a thin, dry towel between your skin and the bag. °· Put the heat pack on the area for 5 minutes, and check your skin. Your skin may be pink, but it should not be red. °· Leave the heat pack on the area for 15 to 30 minutes. °· Repeat this every 2 to 4 hours while awake. Do not use heat while you are sleeping. °Warm water bath °· Fill a tub with warm water. °· Place the affected body part in the tub. °· Soak the area for 20 to 40 minutes. °· Repeat as needed. °Hot water bottle °· Fill the water bottle half full with hot water. °· Press out the extra air. Close the cap tightly. °· Place a dry towel between your skin and the bottle. °· Put the bottle on the area for 5 minutes, and check your skin. Your skin may be pink, but it should not be red. °· Leave the bottle on the area for 15 to 30 minutes. °· Repeat this every 2 to 4 hours while awake. °Electric heating pad °· Place a dry towel between your skin and the heating pad. °· Set the heating pad on low heat. °· Put the heating pad on the area for 10 minutes, and check your skin. Your skin may be pink, but it should not be red. °· Leave the heating pad on the area for 20 to 40 minutes. °· Repeat this every 2 to 4 hours while awake. °· Do not lie on the heating pad. °· Do not fall asleep while using the heating pad. °· Do not use the heating pad near water. °GET HELP RIGHT AWAY IF: °· You get blisters or red skin. °· Your skin is puffy (swollen), or you lose feeling (numbness) in the affected area. °· You  have any new problems. °· Your problems are getting worse. °· You have any questions or concerns. °If you have any problems, stop using heat therapy until you see your doctor. °MAKE SURE YOU: °· Understand these instructions. °· Will watch your condition. °· Will get help right away if you are not doing well or get worse. °Document Released: 12/30/2011 Document Reviewed: 11/30/2013 °ExitCare® Patient Information ©2015 [employer]. This information is not intended to replace advice given to you by your health care provider. Make sure you discuss any questions you have with your health care provider. ° °Muscle Cramps and Spasms °Muscle cramps and spasms are when muscles tighten by themselves. They usually get better within minutes. Muscle cramps are painful. They are usually stronger and last longer than muscle spasms. Muscle spasms may or may not be painful. They can last a few seconds or much longer. °HOME CARE °· Drink enough fluid to keep your pee (urine) clear or pale yellow. °· Massage, stretch, and relax the muscle. °· Use a warm towel, heating pad, or warm shower water on tight muscles. °· Place ice on the muscle if it is tender or in   pain. °¨ Put ice in a plastic bag. °¨ Place a towel between your skin and the bag. °¨ Leave the ice on for 15-20 minutes, 03-04 times a day. °· Only take medicine as told by your doctor. °GET HELP RIGHT AWAY IF:  °Your cramps or spasms get worse, happen more often, or do not get better with time. °MAKE SURE YOU: °· Understand these instructions. °· Will watch your condition. °· Will get help right away if you are not doing well or get worse. °Document Released: 09/19/2008 Document Revised: 02/01/2013 Document Reviewed: 09/23/2012 °ExitCare® Patient Information ©2015 [employer]. This information is not intended to replace advice given to you by your health care provider. Make sure you discuss any questions you have with your health care provider. ° °

## 2014-07-14 NOTE — MAU Note (Signed)
Pt reports she awakened at 0300 with severe rt hip pain. Denies trauma. States it hurts to walk.

## 2014-07-14 NOTE — MAU Provider Note (Signed)
History     CSN: [id number]  Arrival date and time: 07/14/14 0350   First Provider Initiated Contact with Patient 07/14/14 [phone]      Chief Complaint  Patient presents with  . Hip Pain   HPI Ms. Stacy Blackwell is a 33 y.o. G2P1001 at [redacted]w[redacted]d who presents to MAU today with complaint of sudden onset right thigh pain. The patient states that she was woken from sleep at 0100 with severe pain. She denies previous injury, recent change in activity, abdominal pain, back pain, flank pain, vaginal bleeding or UTI symptoms.   OB History   Grav Para Term Preterm Abortions TAB SAB Ect Mult Living   0 0 0 0 0 0 1      Past Medical History  Diagnosis Date  . Asthma   . Vertigo   . Acid reflux   . Obesity   . Depression     doing good  . Ovarian cyst     Past Surgical History  Procedure Laterality Date  . Cyst excision Left 1987    Family History  Problem Relation Age of Onset  . Asthma Mother   . Diabetes Mother   . Heart disease Mother     CHF  . Asthma Sister   . Diabetes Sister   . Heart disease Sister   . Hearing loss Neg Hx     History  Substance Use Topics  . Smoking status: Current Every Day Smoker -- 0.50 packs/day for 10 years    Types: Cigarettes  . Smokeless tobacco: Never Used  . Alcohol Use: No    Allergies:  Allergies  Allergen Reactions  . Amoxicillin Hives and Swelling    Swelling in the eyes    Prescriptions prior to admission  Medication Sig Dispense Refill  . albuterol (PROVENTIL HFA;VENTOLIN HFA) 108 (90 BASE) MCG/ACT inhaler Inhale 2 puffs into the lungs every 6 (six) hours as needed for wheezing or shortness of breath.  1 Inhaler  0  . cetirizine (ZYRTEC) 10 MG tablet Take 10 mg by mouth daily.      . fluticasone (FLONASE) 50 MCG/ACT nasal spray Place 2 sprays into both nostrils 2 (two) times daily.  16 g  6  . ondansetron (ZOFRAN ODT) 4 MG disintegrating tablet Take 1 tablet (4 mg total) by mouth every 8 (eight) hours as needed  for nausea or vomiting.  10 tablet  0  . Spacer/Aero-Holding Molter (AEROCHAMBER MV) inhaler Use as instructed  1 each  0  . albuterol (PROVENTIL) (5 MG/ML) 0.5% nebulizer solution Take 2.5 mg by nebulization every 6 (six) hours as needed for wheezing or shortness of breath.        Review of Systems  Constitutional: Negative for fever and malaise/fatigue.  Gastrointestinal: Negative for abdominal pain.  Genitourinary: Negative for dysuria, urgency and frequency.       Neg - vaginal bleeding   Physical Exam   Blood pressure 138/65, pulse 83, temperature 98.7 F (37.1 C), temperature source Oral, resp. rate 20, height  (1.575 m), weight 265 lb (120.203 kg), last menstrual period 04/19/2014, SpO2 98.00%.  Physical Exam  Constitutional: She is oriented to person, place, and time. She appears well-developed and well-nourished.  Patient is tearful and appears uncomfortable  HENT:  Head: Normocephalic.  Cardiovascular: Normal rate.   Respiratory: Effort normal.  GI: Soft. She exhibits no distension and no mass. There is no tenderness. There is no rebound and no guarding.  Musculoskeletal:  Right hip: She exhibits tenderness. She exhibits normal range of motion and no swelling.       Legs: Neurological: She is alert and oriented to person, place, and time. She has normal strength.  Skin: Skin is warm and dry. No erythema.  Psychiatric: She has a normal mood and affect.   Results for orders placed during the hospital encounter of 07/14/14 (from the past 24 hour(s))  URINALYSIS, ROUTINE W REFLEX MICROSCOPIC     Status: Abnormal   Collection Time    07/14/14  4:00 AM      Result Value Ref Range   Color, Urine YELLOW  YELLOW   APPearance CLEAR  CLEAR   Specific Gravity, Urine <1.005 (*) 1.005 - 1.030   pH 6.5  5.0 - 8.0   Glucose, UA NEGATIVE  NEGATIVE mg/dL   Hgb urine dipstick NEGATIVE  NEGATIVE   Bilirubin Urine NEGATIVE  NEGATIVE   Ketones, ur NEGATIVE  NEGATIVE mg/dL     Protein, ur NEGATIVE  NEGATIVE mg/dL   Urobilinogen, UA 0.2  0.0 - 1.0 mg/dL   Nitrite NEGATIVE  NEGATIVE   Leukocytes, UA NEGATIVE  NEGATIVE    MAU Course  Procedures None  MDM FHR - 152 bpm with doppler Flexeril given in MAU - patient reports minimal improvement in pain, but appears more comfortable and muscle is less firm to palpation 1 mg IM Dilaudid given in MAU - patient reports significant improvement in pain, although still rates pain at 8/10 Patient appears much more comfortable and is able to ambulate without difficulty  Assessment and Plan  A: SIUP at [redacted]w[redacted]d Muscle spasm, right leg  P: Discharge home Rx for Flexeril given to patient Patient advised to follow-up with PCP as scheduled later today Patient advised to follow-up with OB as scheduled this week Heat therapy discussed Patient may return to MAU as needed or if her condition were to change or worsen   Marny Lowenstein, PA-C  07/14/2014, 6:02 AM

## 2014-07-15 ENCOUNTER — Ambulatory Visit (HOSPITAL_COMMUNITY): Payer: Medicare Other

## 2014-07-15 ENCOUNTER — Encounter (HOSPITAL_COMMUNITY): Payer: Self-pay

## 2014-07-15 ENCOUNTER — Other Ambulatory Visit (HOSPITAL_COMMUNITY): Payer: Self-pay | Admitting: Physician Assistant

## 2014-07-15 ENCOUNTER — Other Ambulatory Visit (HOSPITAL_COMMUNITY): Payer: Medicare Other

## 2014-07-15 ENCOUNTER — Ambulatory Visit (HOSPITAL_COMMUNITY)
Admission: RE | Admit: 2014-07-15 | Discharge: 2014-07-15 | Disposition: A | Discharge status: Home / Self Care | Payer: Medicare Other | Source: Ambulatory Visit | Attending: Physician Assistant | Admitting: Physician Assistant

## 2014-07-15 ENCOUNTER — Ambulatory Visit (HOSPITAL_COMMUNITY): Admission: RE | Admit: 2014-07-15 | Payer: Medicare Other | Source: Ambulatory Visit

## 2014-07-15 VITALS — BP 125/42 | HR 79 | Wt 262.2 lb

## 2014-07-15 DIAGNOSIS — Z36 Encounter for antenatal screening of mother: Secondary | ICD-10-CM | POA: Diagnosis not present

## 2014-07-15 DIAGNOSIS — Z3682 Encounter for antenatal screening for nuchal translucency: Secondary | ICD-10-CM

## 2014-07-15 DIAGNOSIS — Z1389 Encounter for screening for other disorder: Secondary | ICD-10-CM | POA: Diagnosis not present

## 2014-07-20 ENCOUNTER — Ambulatory Visit (HOSPITAL_COMMUNITY)
Admission: RE | Admit: 2014-07-20 | Discharge: 2014-07-20 | Disposition: A | Discharge status: Home / Self Care | Payer: Medicare Other | Source: Ambulatory Visit | Attending: Physician Assistant | Admitting: Physician Assistant

## 2014-07-20 ENCOUNTER — Ambulatory Visit (HOSPITAL_COMMUNITY): Admission: RE | Admit: 2014-07-20 | Payer: Medicare Other | Source: Ambulatory Visit

## 2014-07-20 ENCOUNTER — Encounter (HOSPITAL_COMMUNITY): Payer: Self-pay

## 2014-07-20 DIAGNOSIS — Z3682 Encounter for antenatal screening for nuchal translucency: Secondary | ICD-10-CM

## 2014-07-20 DIAGNOSIS — Z36 Encounter for antenatal screening of mother: Secondary | ICD-10-CM | POA: Diagnosis not present

## 2014-08-02 ENCOUNTER — Encounter (HOSPITAL_COMMUNITY): Payer: Self-pay | Admitting: *Deleted

## 2014-08-02 ENCOUNTER — Inpatient Hospital Stay (HOSPITAL_COMMUNITY)
Admission: AD | Admit: 2014-08-02 | Discharge: 2014-08-02 | Disposition: A | Discharge status: Home / Self Care | Payer: Medicare Other | Source: Ambulatory Visit | Attending: Obstetrics & Gynecology | Admitting: Obstetrics & Gynecology

## 2014-08-02 DIAGNOSIS — R05 Cough: Secondary | ICD-10-CM | POA: Diagnosis present

## 2014-08-02 DIAGNOSIS — O219 Vomiting of pregnancy, unspecified: Secondary | ICD-10-CM

## 2014-08-02 DIAGNOSIS — J069 Acute upper respiratory infection, unspecified: Secondary | ICD-10-CM | POA: Insufficient documentation

## 2014-08-02 DIAGNOSIS — O99512 Diseases of the respiratory system complicating pregnancy, second trimester: Secondary | ICD-10-CM | POA: Insufficient documentation

## 2014-08-02 DIAGNOSIS — F17210 Nicotine dependence, cigarettes, uncomplicated: Secondary | ICD-10-CM | POA: Insufficient documentation

## 2014-08-02 DIAGNOSIS — Z3A15 15 weeks gestation of pregnancy: Secondary | ICD-10-CM | POA: Insufficient documentation

## 2014-08-02 DIAGNOSIS — O210 Mild hyperemesis gravidarum: Secondary | ICD-10-CM | POA: Diagnosis not present

## 2014-08-02 DIAGNOSIS — O99332 Smoking (tobacco) complicating pregnancy, second trimester: Secondary | ICD-10-CM | POA: Insufficient documentation

## 2014-08-02 DIAGNOSIS — B9789 Other viral agents as the cause of diseases classified elsewhere: Secondary | ICD-10-CM

## 2014-08-02 LAB — URINALYSIS, ROUTINE W REFLEX MICROSCOPIC
BILIRUBIN URINE: NEGATIVE
Glucose, UA: NEGATIVE mg/dL
HGB URINE DIPSTICK: NEGATIVE
KETONES UR: NEGATIVE mg/dL
Leukocytes, UA: NEGATIVE
NITRITE: NEGATIVE
PROTEIN: NEGATIVE mg/dL
Specific Gravity, Urine: 1.02 (ref 1.005–1.030)
Urobilinogen, UA: 2 mg/dL — ABNORMAL HIGH (ref 0.0–1.0)
pH: 7 (ref 5.0–8.0)

## 2014-08-02 MED ORDER — IPRATROPIUM BROMIDE 0.02 % IN SOLN
0.5000 mg | Freq: Once | RESPIRATORY_TRACT | Status: AC
  Administered 2014-08-02: 0.5 mg via RESPIRATORY_TRACT
  Filled 2014-08-02: qty 2.5

## 2014-08-02 MED ORDER — ALBUTEROL SULFATE HFA 108 (90 BASE) MCG/ACT IN AERS: 2.0000 | INHALATION_SPRAY | Freq: Four times a day (QID) | RESPIRATORY_TRACT | Status: DC | PRN

## 2014-08-02 MED ORDER — ALBUTEROL SULFATE (2.5 MG/3ML) 0.083% IN NEBU
2.5000 mg | INHALATION_SOLUTION | Freq: Once | RESPIRATORY_TRACT | Status: AC
  Administered 2014-08-02: 2.5 mg via RESPIRATORY_TRACT
  Filled 2014-08-02: qty 3

## 2014-08-02 MED ORDER — LACTATED RINGERS IV BOLUS (SEPSIS)
1000.0000 mL | Freq: Once | INTRAVENOUS | Status: AC
  Administered 2014-08-02: 1000 mL via INTRAVENOUS

## 2014-08-02 MED ORDER — ONDANSETRON 4 MG PO TBDP: 4.0000 mg | ORAL_TABLET | Freq: Three times a day (TID) | ORAL | Status: DC | PRN

## 2014-08-02 MED ORDER — ONDANSETRON HCL 4 MG/2ML IJ SOLN
4.0000 mg | Freq: Once | INTRAMUSCULAR | Status: AC
  Administered 2014-08-02: 4 mg via INTRAVENOUS
  Filled 2014-08-02: qty 2

## 2014-08-02 NOTE — Progress Notes (Signed)
Stacy NippleJulie Wenzel PA in earlier to discuss d/c plan with pt. WRitten and verbal d/c instructions given and understanding voiced. List of meds ok in pregnancy given to pt.

## 2014-08-02 NOTE — MAU Provider Note (Signed)
History     CSN: [id number]  Arrival date and time: 08/02/14 [id number]   First Provider Initiated Contact with Patient 08/02/14 [phone]      Chief Complaint  Patient presents with  . Cough  . Abdominal Pain   HPI Ms. Stacy BeechamStephanie R Nicoll is a 33 y.o. G2P1001 at 718w0d who presents to MAU today with complaint of cough, nasal congestion and N/V x 2 days. The patient states that coughing has made her gag and vomit often. The denies sore throat, fever or ear pain/discharge. She states lower abdominal pain aggravated with coughing and vomiting. She denies vaginal bleeding, abnormal discharge or LOF.   OB History   Grav Para Term Preterm Abortions TAB SAB Ect Mult Living   2 1 1  0 0 0 0 0 0 1      Past Medical History  Diagnosis Date  . Asthma   . Vertigo   . Acid reflux   . Obesity   . Depression     doing good  . Ovarian cyst     Past Surgical History  Procedure Laterality Date  . Cyst excision Left 1987    Family History  Problem Relation Age of Onset  . Asthma Mother   . Diabetes Mother   . Heart disease Mother     CHF  . Asthma Sister   . Diabetes Sister   . Heart disease Sister   . Hearing loss Neg Hx     History  Substance Use Topics  . Smoking status: Current Every Day Smoker -- 0.50 packs/day for 10 years    Types: Cigarettes  . Smokeless tobacco: Never Used  . Alcohol Use: No    Allergies:  Allergies  Allergen Reactions  . Amoxicillin Hives and Swelling    Swelling in the eyes    Prescriptions prior to admission  Medication Sig Dispense Refill  . albuterol (PROVENTIL) (5 MG/ML) 0.5% nebulizer solution Take 2.5 mg by nebulization every 6 (six) hours as needed for wheezing or shortness of breath.      . cetirizine (ZYRTEC) 10 MG tablet Take 10 mg by mouth daily.      . fluticasone (FLONASE) 50 MCG/ACT nasal spray Place 2 sprays into both nostrils daily as needed for allergies or rhinitis.      . [DISCONTINUED] albuterol (PROVENTIL HFA;VENTOLIN HFA)  108 (90 BASE) MCG/ACT inhaler Inhale 2 puffs into the lungs every 6 (six) hours as needed for wheezing or shortness of breath.  1 Inhaler  0  . [DISCONTINUED] ondansetron (ZOFRAN ODT) 4 MG disintegrating tablet Take 1 tablet (4 mg total) by mouth every 8 (eight) hours as needed for nausea or vomiting.  10 tablet  0  . Spacer/Aero-Holding Marmo (AEROCHAMBER MV) inhaler Use as instructed  1 each  0    Review of Systems  Constitutional: Negative for fever and malaise/fatigue.  HENT: Positive for congestion. Negative for ear discharge, ear pain and sore throat.   Respiratory: Positive for cough and sputum production.   Gastrointestinal: Positive for nausea, vomiting and abdominal pain. Negative for diarrhea and constipation.  Genitourinary: Negative for dysuria, urgency and frequency.       Neg - vaginal bleeding, discharge, LOF   Physical Exam   Blood pressure 119/59, pulse 88, temperature 98.1 F (36.7 C), temperature source Oral, resp. rate 20, height 5\' 3"  (1.6 m), weight 262 lb 3.2 oz (118.933 kg), last menstrual period 04/19/2014, SpO2 100.00%.  Physical Exam  Constitutional: She is oriented to person, place,  and time. She appears well-developed and well-nourished. No distress.  HENT:  Head: Normocephalic.  Right Ear: External ear normal.  Left Ear: External ear normal.  Mouth/Throat: Oropharynx is clear and moist. No oropharyngeal exudate.  Cardiovascular: Normal rate.   Respiratory: Effort normal. No respiratory distress. She has wheezes.  GI: Soft. She exhibits no distension and no mass. There is tenderness (mild tenderness to palpation of the lower abdomen). There is no rebound and no guarding.  Neurological: She is alert and oriented to person, place, and time.  Skin: Skin is warm and dry. No erythema.  Psychiatric: She has a normal mood and affect.   Results for orders placed during the hospital encounter of 08/02/14 (from the past 24 hour(s))  URINALYSIS, ROUTINE W REFLEX  MICROSCOPIC     Status: Abnormal   Collection Time    08/02/14  7:50 AM      Result Value Ref Range   Color, Urine YELLOW  YELLOW   APPearance CLEAR  CLEAR   Specific Gravity, Urine 1.020  1.005 - 1.030   pH 7.0  5.0 - 8.0   Glucose, UA NEGATIVE  NEGATIVE mg/dL   Hgb urine dipstick NEGATIVE  NEGATIVE   Bilirubin Urine NEGATIVE  NEGATIVE   Ketones, ur NEGATIVE  NEGATIVE mg/dL   Protein, ur NEGATIVE  NEGATIVE mg/dL   Urobilinogen, UA 2.0 (*) 0.0 - 1.0 mg/dL   Nitrite NEGATIVE  NEGATIVE   Leukocytes, UA NEGATIVE  NEGATIVE     MAU Course  Procedures  MDM FHR - 150 bpm with doppler 1 liter IV LR with 4 mg Zofran given - patient reports improvement in N/V Albuterol and atrovent given in Nebulizer for wheezing - patient reports improvement in symptoms  Assessment and Plan  A: Acute viral URI Nausea and vomiting in pregnancy prior to [redacted] weeks gestation  P: Discharge home Rx for Zofran and Albuterol inhaler given to patient List of OTC medications for URI safe in pregnancy given to patient. Suggested Mucinex -D, Zyrtec and Tylenol PRN Patient advised to follow-up with [facility] as scheduled for routine prenatal care or sooner if symptoms worsen Patient may return to MAU as needed or if her condition were to change or worsen   Marny LowensteinJulie N Wenzel, PA-C  08/02/2014, 11:28 AM

## 2014-08-02 NOTE — MAU Note (Signed)
Patient states she has had a cough for the past 2 days. States she is now having abdominal and chest pain with the coughing and the cough makes her vomit.

## 2014-08-02 NOTE — Discharge Instructions (Signed)
Upper Respiratory Infection, Adult An upper respiratory infection (URI) is also known as the common cold. It is often caused by a type of germ (virus). Colds are easily spread (contagious). You can pass it to others by kissing, coughing, sneezing, or drinking out of the same glass. Usually, you get better in 1 or 2 weeks.  HOME CARE   Only take medicine as told by your doctor.  Use a warm mist humidifier or breathe in steam from a hot shower.  Drink enough water and fluids to keep your pee (urine) clear or pale yellow.  Get plenty of rest.  Return to work when your temperature is back to normal or as told by your doctor. You may use a face mask and wash your hands to stop your cold from spreading. GET HELP RIGHT AWAY IF:   After the first few days, you feel you are getting worse.  You have questions about your medicine.  You have chills, shortness of breath, or brown or red spit (mucus).  You have yellow or brown snot (nasal discharge) or pain in the face, especially when you bend forward.  You have a fever, puffy (swollen) neck, pain when you swallow, or white spots in the back of your throat.  You have a bad headache, ear pain, sinus pain, or chest pain.  You have a high-pitched whistling sound when you breathe in and out (wheezing).  You have a lasting cough or cough up blood.  You have sore muscles or a stiff neck. MAKE SURE YOU:   Understand these instructions.  Will watch your condition.  Will get help right away if you are not doing well or get worse. Document Released: 03/25/2008 Document Revised: 12/30/2011 Document Reviewed: 01/12/2014 Exit[employer] Patient Information 2015 [employer]. This information is not intended to replace advice given to you by your health care provider. Make sure you discuss any questions you have with your health care provider.  

## 2014-08-17 ENCOUNTER — Telehealth: Payer: Self-pay | Admitting: Emergency Medicine

## 2014-08-17 ENCOUNTER — Ambulatory Visit (INDEPENDENT_AMBULATORY_CARE_PROVIDER_SITE_OTHER): Payer: Medicare Other | Admitting: Emergency Medicine

## 2014-08-17 ENCOUNTER — Encounter: Payer: Self-pay | Admitting: Emergency Medicine

## 2014-08-17 VITALS — BP 120/66 | HR 87 | Temp 97.7°F | Ht 61.0 in | Wt 268.8 lb

## 2014-08-17 DIAGNOSIS — J45901 Unspecified asthma with (acute) exacerbation: Secondary | ICD-10-CM | POA: Diagnosis not present

## 2014-08-17 DIAGNOSIS — K219 Gastro-esophageal reflux disease without esophagitis: Secondary | ICD-10-CM | POA: Diagnosis not present

## 2014-08-17 MED ORDER — FLUTICASONE PROPIONATE 50 MCG/ACT NA SUSP: 2.0000 | Freq: Every day | NASAL | Status: DC | PRN

## 2014-08-17 MED ORDER — FAMOTIDINE 20 MG PO TABS: 20.0000 mg | ORAL_TABLET | Freq: Two times a day (BID) | ORAL | Status: DC

## 2014-08-17 MED ORDER — BUDESONIDE-FORMOTEROL FUMARATE 160-4.5 MCG/ACT IN AERO: 2.0000 | INHALATION_SPRAY | Freq: Two times a day (BID) | RESPIRATORY_TRACT | Status: DC

## 2014-08-17 MED ORDER — CETIRIZINE HCL 10 MG PO TABS: 10.0000 mg | ORAL_TABLET | Freq: Every day | ORAL | Status: DC

## 2014-08-17 NOTE — Progress Notes (Signed)
Subjective:    Patient ID: Stacy Blackwell, female    DOB: 10/03/1981, 33 y.o.   MRN: [id number]  HPI 33 yo woman, smoker (5 pk-yrs). She is G2P1 [redacted] weeks pregnant with hx of GERD, depression. She has asthma that was dx in her teens. She has been managed at times with LABA/ICS or ICS alone. Her sx seem to wax/wane with the seasons and her allergies. For the last 6 months she has not needed a scheduled medication, has not needed daily albuterol.   Her sx have been worse for the last 10 weeks, since she has been pregnant. She is having more GERD as well. Was just given pred x 5 days for refractory SOB and wheezing, cough with brownish sputum. She has improved some now > sputum better.   Triggers = allergy seasons both Fall and Spring, dusts, many allergens grasses, trees, pets, etc. GERD.   ROV 08/17/14 -- follow up for asthma and allergies. She is now [redacted] weeks pregnant. GERD not controlled at this time >> has not been able to take meds thus far. She was doing a bit better until the last few weeks - more GERD and then she caught a URI from her daughter.    Review of Systems  Constitutional: Negative for fever and unexpected weight change.  HENT: Negative for congestion, dental problem, ear pain, nosebleeds, postnasal drip, rhinorrhea, sinus pressure, sneezing, sore throat and trouble swallowing.   Eyes: Negative for redness and itching.  Respiratory: Positive for cough ( with brown mucus production ) and wheezing. Negative for chest tightness and shortness of breath.   Cardiovascular: Negative for palpitations and leg swelling.  Gastrointestinal: Negative for nausea and vomiting.  Genitourinary: Negative for dysuria.  Musculoskeletal: Negative for joint swelling.  Skin: Negative for rash.  Neurological: Negative for headaches.  Hematological: Does not bruise/bleed easily.  Psychiatric/Behavioral: Negative for dysphoric mood. The patient is not nervous/anxious.    Past Medical History    Diagnosis Date  . Asthma   . Vertigo   . Acid reflux   . Obesity   . Depression     doing good  . Ovarian cyst      Family History  Problem Relation Age of Onset  . Asthma Mother   . Diabetes Mother   . Heart disease Mother     CHF  . Asthma Sister   . Diabetes Sister   . Heart disease Sister   . Hearing loss Neg Hx      History   Social History  . Marital Status: Single    Spouse Name: N/A    Number of Children: N/A  . Years of Education: N/A   Occupational History  . unemployed    Social History Main Topics  . Smoking status: Current Every Day Smoker -- 0.50 packs/day for 10 years    Types: Cigarettes  . Smokeless tobacco: Never Used  . Alcohol Use: No  . Drug Use: No  . Sexual Activity: Yes    Birth Control/ Protection: None   Other Topics Concern  . Not on file   Social History Narrative  . No narrative on file     Allergies  Allergen Reactions  . Amoxicillin Hives and Swelling    Swelling in the eyes     Outpatient Prescriptions Prior to Visit  Medication Sig Dispense Refill  . albuterol (PROVENTIL HFA;VENTOLIN HFA) 108 (90 BASE) MCG/ACT inhaler Inhale 2 puffs into the lungs every 6 (six) hours as needed  for wheezing or shortness of breath.  1 Inhaler  0  . albuterol (PROVENTIL) (5 MG/ML) 0.5% nebulizer solution Take 2.5 mg by nebulization every 6 (six) hours as needed for wheezing or shortness of breath.      . cetirizine (ZYRTEC) 10 MG tablet Take 10 mg by mouth daily.      . fluticasone (FLONASE) 50 MCG/ACT nasal spray Place 2 sprays into both nostrils daily as needed for allergies or rhinitis.      Marland Kitchen. Spacer/Aero-Holding Plude (AEROCHAMBER MV) inhaler Use as instructed  1 each  0  . ondansetron (ZOFRAN ODT) 4 MG disintegrating tablet Take 1 tablet (4 mg total) by mouth every 8 (eight) hours as needed for nausea or vomiting.  10 tablet  0   No facility-administered medications prior to visit.         Objective:   Physical Exam Filed  Vitals:   08/17/14 1331  BP: 120/66  Pulse: 87  Temp: 97.7 F (36.5 C)  TempSrc: Oral  Height: 5\' 1"  (1.549 m)  Weight: 268 lb 12.8 oz (121.927 kg)  SpO2: 99%   Gen: Pleasant, obese woman, in no distress,  normal affect  ENT: No lesions,  mouth clear,  oropharynx clear but narrow with large tonsils, no postnasal drip  Neck: No JVD, no TMG, no carotid bruits  Lungs: No use of accessory muscles, clear without rales or rhonchi  Cardiovascular: RRR, heart sounds normal, no murmur or gallops, no peripheral edema  Musculoskeletal: No deformities, no cyanosis or clubbing  Neuro: alert, non focal  Skin: Warm, no lesions or rashes      Assessment & Plan:  No problem-specific assessment & plan notes found for this encounter.

## 2014-08-17 NOTE — Addendum Note (Signed)
Addended by: York RamGAY, Jose Corvin on: 08/17/2014 03:37 PM   Modules accepted: Orders

## 2014-08-17 NOTE — Patient Instructions (Signed)
We will change asmanex to Symbicort 2 puffs twice a day We will start pepcid 20mg  twice a day Start fluticasone nasal spray, 2 sprays each nostril daily Continue cetirizine daily  Use albuterol as needed Follow with Dr Delton CoombesByrum in 4-6 weeks

## 2014-08-17 NOTE — Telephone Encounter (Signed)
Spoke with pt, states she only needs zyrtec refill. Pt seen today, zyrtec recommended in pt's recs.  Refill sent to approved pharmacy, nothing further needed.

## 2014-08-22 ENCOUNTER — Encounter: Payer: Self-pay | Admitting: Emergency Medicine

## 2014-08-24 ENCOUNTER — Other Ambulatory Visit (HOSPITAL_COMMUNITY): Payer: Self-pay | Admitting: Urology

## 2014-08-24 DIAGNOSIS — Z3689 Encounter for other specified antenatal screening: Secondary | ICD-10-CM

## 2014-08-24 DIAGNOSIS — O281 Abnormal biochemical finding on antenatal screening of mother: Secondary | ICD-10-CM | POA: Diagnosis not present

## 2014-08-26 ENCOUNTER — Ambulatory Visit (HOSPITAL_COMMUNITY)
Admission: RE | Admit: 2014-08-26 | Discharge: 2014-08-26 | Disposition: A | Discharge status: Home / Self Care | Payer: Medicare Other | Source: Ambulatory Visit | Attending: Urology | Admitting: Urology

## 2014-08-26 DIAGNOSIS — Z3689 Encounter for other specified antenatal screening: Secondary | ICD-10-CM | POA: Insufficient documentation

## 2014-08-26 DIAGNOSIS — Z3A18 18 weeks gestation of pregnancy: Secondary | ICD-10-CM | POA: Insufficient documentation

## 2014-08-26 DIAGNOSIS — Z36 Encounter for antenatal screening of mother: Secondary | ICD-10-CM | POA: Insufficient documentation

## 2014-09-08 ENCOUNTER — Other Ambulatory Visit (HOSPITAL_COMMUNITY): Payer: Self-pay | Admitting: Nurse Practitioner

## 2014-09-08 DIAGNOSIS — Z3689 Encounter for other specified antenatal screening: Secondary | ICD-10-CM

## 2014-09-09 DIAGNOSIS — O403XX1 Polyhydramnios, third trimester, fetus 1: Secondary | ICD-10-CM | POA: Diagnosis not present

## 2014-09-09 DIAGNOSIS — Z23 Encounter for immunization: Secondary | ICD-10-CM | POA: Diagnosis not present

## 2014-09-09 DIAGNOSIS — Z3482 Encounter for supervision of other normal pregnancy, second trimester: Secondary | ICD-10-CM | POA: Diagnosis not present

## 2014-09-09 DIAGNOSIS — Z3483 Encounter for supervision of other normal pregnancy, third trimester: Secondary | ICD-10-CM | POA: Diagnosis not present

## 2014-09-20 ENCOUNTER — Encounter: Payer: Self-pay | Admitting: Emergency Medicine

## 2014-09-20 ENCOUNTER — Ambulatory Visit (INDEPENDENT_AMBULATORY_CARE_PROVIDER_SITE_OTHER): Payer: Medicare Other | Admitting: Emergency Medicine

## 2014-09-20 VITALS — BP 106/60 | HR 99 | Ht 62.0 in | Wt 266.8 lb

## 2014-09-20 DIAGNOSIS — J4521 Mild intermittent asthma with (acute) exacerbation: Secondary | ICD-10-CM | POA: Diagnosis not present

## 2014-09-20 MED ORDER — PREDNISONE 10 MG PO TABS: ORAL_TABLET | ORAL | Status: DC

## 2014-09-20 NOTE — Assessment & Plan Note (Signed)
Poor control due to multiple exacerbating factors. Unfortunately her GERD is going be very difficult to manage and she has another 20 weeks before she delivers her baby. We will treat with a brief course of prednisone although I have explained that she likely will go right back to her current symptoms once the taper is finished.

## 2014-09-20 NOTE — Assessment & Plan Note (Signed)
Still only moderately controlled. She is on an H2 blocker and believes that she was recently started on a PPI although she can't recall the name. A large part of this is due to her pregnancy. Discussed today the benefits of staying on her medications and raising the head of her bed.

## 2014-09-20 NOTE — Patient Instructions (Signed)
Please continue current medications as you have been taking them Take prednisone as directed Follow with Dr Delton CoombesByrum in 2 months or sooner if you have any problems.

## 2014-09-20 NOTE — Progress Notes (Signed)
Subjective:    Patient ID: Stacy Blackwell, female    DOB: 03/20/1981, 33 y.o.   MRN: [id number]  HPI 33 yo woman, smoker (5 pk-yrs). She is G2P1 [redacted] weeks pregnant with hx of GERD, depression. She has asthma that was dx in her teens. She has been managed at times with LABA/ICS or ICS alone. Her sx seem to wax/wane with the seasons and her allergies. For the last 6 months she has not needed a scheduled medication, has not needed daily albuterol.   Her sx have been worse for the last 10 weeks, since she has been pregnant. She is having more GERD as well. Was just given pred x 5 days for refractory SOB and wheezing, cough with brownish sputum. She has improved some now > sputum better.   Triggers = allergy seasons both Fall and Spring, dusts, many allergens grasses, trees, pets, etc. GERD.   ROV 08/17/14 -- follow up for asthma and allergies. She is now [redacted] weeks pregnant. GERD not controlled at this time >> has not been able to take meds thus far. She was doing a bit better until the last few weeks - more GERD and then she caught a URI from her daughter.   ROV 09/20/14 -- follow-up visit for asthma that has been exacerbated by allergies, GERD, and her pregnancy. She is now 22 weeks. She has been having a lot of cough, although her GERD seems a bit better, still not well controlled. She is taking pepcid, fluticazone, ceterizine. She is on symbicort bid, feels that it was better than asmanex. She is using albuterol frequently.  She was just started on a new med - she believes it is a PPI, can't recall name. She is asking for Prednisone.    Review of Systems  Constitutional: Negative for fever and unexpected weight change.  HENT: Negative for congestion, dental problem, ear pain, nosebleeds, postnasal drip, rhinorrhea, sinus pressure, sneezing, sore throat and trouble swallowing.   Eyes: Negative for redness and itching.  Respiratory: Positive for cough, shortness of breath and wheezing. Negative  for chest tightness.   Cardiovascular: Negative for palpitations and leg swelling.  Gastrointestinal: Negative for nausea and vomiting.  Genitourinary: Negative for dysuria.  Musculoskeletal: Negative for joint swelling.  Skin: Negative for rash.  Neurological: Negative for headaches.  Hematological: Does not bruise/bleed easily.  Psychiatric/Behavioral: Negative for dysphoric mood. The patient is not nervous/anxious.         Objective:   Physical Exam Filed Vitals:   09/20/14 0944  BP: 106/60  Pulse: 99  Height: 5\' 2"  (1.575 m)  Weight: 266 lb 12.8 oz (121.02 kg)  SpO2: 100%   Gen: Pleasant, obese woman, in no distress,  normal affect  ENT: No lesions,  mouth clear,  oropharynx clear but narrow with large tonsils, no postnasal drip  Neck: No JVD, no TMG, no carotid bruits  Lungs: No use of accessory muscles, clear without rales or rhonchi  Cardiovascular: RRR, heart sounds normal, no murmur or gallops, no peripheral edema  Musculoskeletal: No deformities, no cyanosis or clubbing  Neuro: alert, non focal  Skin: Warm, no lesions or rashes      Assessment & Plan:  GERD (gastroesophageal reflux disease) Still only moderately controlled. She is on an H2 blocker and believes that she was recently started on a PPI although she can't recall the name. A large part of this is due to her pregnancy. Discussed today the benefits of staying on her medications and raising  the head of her bed.  Intrinsic asthma Poor control due to multiple exacerbating factors. Unfortunately her GERD is going be very difficult to manage and she has another 20 weeks before she delivers her baby. We will treat with a brief course of prednisone although I have explained that she likely will go right back to her current symptoms once the taper is finished.

## 2014-09-23 ENCOUNTER — Ambulatory Visit (HOSPITAL_COMMUNITY)
Admission: RE | Admit: 2014-09-23 | Discharge: 2014-09-23 | Disposition: A | Discharge status: Home / Self Care | Payer: Medicare Other | Source: Ambulatory Visit | Attending: Nurse Practitioner | Admitting: Nurse Practitioner

## 2014-09-23 DIAGNOSIS — Z3689 Encounter for other specified antenatal screening: Secondary | ICD-10-CM

## 2014-09-23 DIAGNOSIS — O99212 Obesity complicating pregnancy, second trimester: Secondary | ICD-10-CM | POA: Diagnosis not present

## 2014-09-23 DIAGNOSIS — Z36 Encounter for antenatal screening of mother: Secondary | ICD-10-CM | POA: Insufficient documentation

## 2014-09-23 DIAGNOSIS — Z3A20 20 weeks gestation of pregnancy: Secondary | ICD-10-CM | POA: Diagnosis not present

## 2014-09-23 DIAGNOSIS — Z3A22 22 weeks gestation of pregnancy: Secondary | ICD-10-CM | POA: Diagnosis not present

## 2014-09-27 DIAGNOSIS — Z3A22 22 weeks gestation of pregnancy: Secondary | ICD-10-CM | POA: Insufficient documentation

## 2014-09-27 DIAGNOSIS — Z0489 Encounter for examination and observation for other specified reasons: Secondary | ICD-10-CM | POA: Insufficient documentation

## 2014-09-27 DIAGNOSIS — Z3490 Encounter for supervision of normal pregnancy, unspecified, unspecified trimester: Secondary | ICD-10-CM | POA: Diagnosis not present

## 2014-09-27 DIAGNOSIS — O403XX1 Polyhydramnios, third trimester, fetus 1: Secondary | ICD-10-CM | POA: Diagnosis not present

## 2014-09-27 DIAGNOSIS — O99210 Obesity complicating pregnancy, unspecified trimester: Secondary | ICD-10-CM | POA: Insufficient documentation

## 2014-09-27 DIAGNOSIS — IMO0002 Reserved for concepts with insufficient information to code with codable children: Secondary | ICD-10-CM | POA: Insufficient documentation

## 2014-10-04 ENCOUNTER — Inpatient Hospital Stay (HOSPITAL_COMMUNITY): Payer: Medicare Other

## 2014-10-04 ENCOUNTER — Inpatient Hospital Stay (HOSPITAL_COMMUNITY)
Admission: AD | Admit: 2014-10-04 | Discharge: 2014-10-04 | Disposition: A | Discharge status: Home / Self Care | Payer: Medicare Other | Source: Ambulatory Visit | Attending: Obstetrics & Gynecology | Admitting: Obstetrics & Gynecology

## 2014-10-04 ENCOUNTER — Encounter (HOSPITAL_COMMUNITY): Payer: Self-pay | Admitting: *Deleted

## 2014-10-04 DIAGNOSIS — O23592 Infection of other part of genital tract in pregnancy, second trimester: Secondary | ICD-10-CM | POA: Insufficient documentation

## 2014-10-04 DIAGNOSIS — B9689 Other specified bacterial agents as the cause of diseases classified elsewhere: Secondary | ICD-10-CM | POA: Insufficient documentation

## 2014-10-04 DIAGNOSIS — Z3A24 24 weeks gestation of pregnancy: Secondary | ICD-10-CM | POA: Diagnosis not present

## 2014-10-04 DIAGNOSIS — F17210 Nicotine dependence, cigarettes, uncomplicated: Secondary | ICD-10-CM | POA: Insufficient documentation

## 2014-10-04 DIAGNOSIS — O99332 Smoking (tobacco) complicating pregnancy, second trimester: Secondary | ICD-10-CM | POA: Diagnosis not present

## 2014-10-04 DIAGNOSIS — N760 Acute vaginitis: Secondary | ICD-10-CM | POA: Insufficient documentation

## 2014-10-04 DIAGNOSIS — R109 Unspecified abdominal pain: Secondary | ICD-10-CM | POA: Diagnosis not present

## 2014-10-04 DIAGNOSIS — N949 Unspecified condition associated with female genital organs and menstrual cycle: Secondary | ICD-10-CM

## 2014-10-04 DIAGNOSIS — O9989 Other specified diseases and conditions complicating pregnancy, childbirth and the puerperium: Secondary | ICD-10-CM

## 2014-10-04 DIAGNOSIS — Z3689 Encounter for other specified antenatal screening: Secondary | ICD-10-CM

## 2014-10-04 DIAGNOSIS — IMO0002 Reserved for concepts with insufficient information to code with codable children: Secondary | ICD-10-CM

## 2014-10-04 DIAGNOSIS — O26899 Other specified pregnancy related conditions, unspecified trimester: Secondary | ICD-10-CM

## 2014-10-04 DIAGNOSIS — R102 Pelvic and perineal pain: Secondary | ICD-10-CM

## 2014-10-04 DIAGNOSIS — O2690 Pregnancy related conditions, unspecified, unspecified trimester: Secondary | ICD-10-CM | POA: Diagnosis not present

## 2014-10-04 LAB — CBC
HCT: 31.2 % — ABNORMAL LOW (ref 36.0–46.0)
HEMOGLOBIN: 10.6 g/dL — AB (ref 12.0–15.0)
MCH: 29.4 pg (ref 26.0–34.0)
MCHC: 34 g/dL (ref 30.0–36.0)
MCV: 86.4 fL (ref 78.0–100.0)
PLATELETS: 217 10*3/uL (ref 150–400)
RBC: 3.61 MIL/uL — AB (ref 3.87–5.11)
RDW: 13.4 % (ref 11.5–15.5)
WBC: 9.1 10*3/uL (ref 4.0–10.5)

## 2014-10-04 LAB — RAPID URINE DRUG SCREEN, HOSP PERFORMED
AMPHETAMINES: NOT DETECTED
Barbiturates: NOT DETECTED
Benzodiazepines: NOT DETECTED
COCAINE: NOT DETECTED
Opiates: NOT DETECTED
TETRAHYDROCANNABINOL: NOT DETECTED

## 2014-10-04 LAB — URINALYSIS, ROUTINE W REFLEX MICROSCOPIC
BILIRUBIN URINE: NEGATIVE
GLUCOSE, UA: NEGATIVE mg/dL
HGB URINE DIPSTICK: NEGATIVE
Ketones, ur: NEGATIVE mg/dL
Leukocytes, UA: NEGATIVE
Nitrite: NEGATIVE
PROTEIN: NEGATIVE mg/dL
Specific Gravity, Urine: 1.01 (ref 1.005–1.030)
UROBILINOGEN UA: 0.2 mg/dL (ref 0.0–1.0)
pH: 6 (ref 5.0–8.0)

## 2014-10-04 LAB — WET PREP, GENITAL
Trich, Wet Prep: NONE SEEN
Yeast Wet Prep HPF POC: NONE SEEN

## 2014-10-04 MED ORDER — METRONIDAZOLE 0.75 % VA GEL: 1.0000 | Freq: Every day | VAGINAL | Status: DC

## 2014-10-04 MED ORDER — METRONIDAZOLE 500 MG PO TABS: 500.0000 mg | ORAL_TABLET | Freq: Two times a day (BID) | ORAL | Status: DC

## 2014-10-04 NOTE — MAU Provider Note (Signed)
Chief Complaint:  Abdominal Pain   First Provider Initiated Contact with Patient 10/04/14 1945      HPI: Stacy BeechamStephanie R Blackwell is a 33 y.o. G2P1001 at [id number] who presents to maternity admissions via EMS reporting lower abdominal pain x 2 days, worsening today.  She reports the pain is mostly on her right lower side and is worse when she walks or gets in or out of bed.  The pain is constant but worsens intermittently, occuring 1-2x/hour.  She denies nausea or vomiting today.  She reports good fetal movement, denies LOF, vaginal bleeding, vaginal itching/burning, urinary symptoms, h/a, dizziness, or fever/chills.     Past Medical History: Past Medical History  Diagnosis Date  . Asthma   . Vertigo   . Acid reflux   . Obesity   . Depression     doing good  . Ovarian cyst     Past obstetric history: OB History  Gravida Para Term Preterm AB SAB TAB Ectopic Multiple Living  2 1 1  0 0 0 0 0 0 1    # Outcome Date GA Lbr Len/2nd Weight Sex Delivery Anes PTL Lv  2 Current           1 Term 03/03/08    F Vag-Spont EPI N Y      Past Surgical History: Past Surgical History  Procedure Laterality Date  . Cyst excision Left 1987    Family History: Family History  Problem Relation Age of Onset  . Asthma Mother   . Diabetes Mother   . Heart disease Mother     CHF  . Asthma Sister   . Diabetes Sister   . Heart disease Sister   . Hearing loss Neg Hx     Social History: History  Substance Use Topics  . Smoking status: Current Every Day Smoker -- 0.50 packs/day for 10 years    Types: Cigarettes  . Smokeless tobacco: Never Used  . Alcohol Use: No    Allergies:  Allergies  Allergen Reactions  . Amoxicillin Hives and Swelling    Swelling in the eyes    Meds:  No prescriptions prior to admission    ROS: Pertinent findings in history of present illness.  Physical Exam  Blood pressure 131/59, pulse 78, temperature 98.1 F (36.7 C), temperature source Oral, resp. rate 18,  last menstrual period 04/19/2014. GENERAL: Well-developed, well-nourished female in no acute distress.  HEENT: normocephalic HEART: normal rate RESP: normal effort ABDOMEN: Soft, gravid appropriate for gestational age, no rebound tenderness, no guarding, mild tenderness in right inguinal area only EXTREMITIES: Nontender, no edema NEURO: alert and oriented  Dilation: Closed Exam by:: Dr Loreta AveAcosta  FHT:  Baseline 145 , moderate variability, accelerations present, no decelerations Contractions: None on toco or to palpation   Labs: Results for orders placed or performed during the hospital encounter of 10/04/14 (from the past 24 hour(s))  Wet prep, genital     Status: Abnormal   Collection Time: 10/04/14  7:47 PM  Result Value Ref Range   Yeast Wet Prep HPF POC NONE SEEN NONE SEEN   Trich, Wet Prep NONE SEEN NONE SEEN   Clue Cells Wet Prep HPF POC FEW (A) NONE SEEN   WBC, Wet Prep HPF POC FEW (A) NONE SEEN  CBC     Status: Abnormal   Collection Time: 10/04/14  8:25 PM  Result Value Ref Range   WBC 9.1 4.0 - 10.5 K/uL   RBC 3.61 (L) 3.87 - 5.11 MIL/uL  Hemoglobin 10.6 (L) 12.0 - 15.0 g/dL   HCT 16.131.2 (L) 09.636.0 - 04.546.0 %   MCV 86.4 78.0 - 100.0 fL   MCH 29.4 26.0 - 34.0 pg   MCHC 34.0 30.0 - 36.0 g/dL   RDW 40.913.4 81.111.5 - 91.415.5 %   Platelets 217 150 - 400 K/uL  Urinalysis, Routine w reflex microscopic     Status: None   Collection Time: 10/04/14  8:50 PM  Result Value Ref Range   Color, Urine YELLOW YELLOW   APPearance CLEAR CLEAR   Specific Gravity, Urine 1.010 1.005 - 1.030   pH 6.0 5.0 - 8.0   Glucose, UA NEGATIVE NEGATIVE mg/dL   Hgb urine dipstick NEGATIVE NEGATIVE   Bilirubin Urine NEGATIVE NEGATIVE   Ketones, ur NEGATIVE NEGATIVE mg/dL   Protein, ur NEGATIVE NEGATIVE mg/dL   Urobilinogen, UA 0.2 0.0 - 1.0 mg/dL   Nitrite NEGATIVE NEGATIVE   Leukocytes, UA NEGATIVE NEGATIVE  Urine rapid drug screen (hosp performed)     Status: None   Collection Time: 10/04/14  8:50 PM   Result Value Ref Range   Opiates NONE DETECTED NONE DETECTED   Cocaine NONE DETECTED NONE DETECTED   Benzodiazepines NONE DETECTED NONE DETECTED   Amphetamines NONE DETECTED NONE DETECTED   Tetrahydrocannabinol NONE DETECTED NONE DETECTED   Barbiturates NONE DETECTED NONE DETECTED    Assessment: 1. Confirm fetal cardiac activity using ultrasound   2. Fetal heart tones not heard   3. Pain of round ligament affecting pregnancy, antepartum   4. BV (bacterial vaginosis)     Plan: Discharge home Labor precautions and fetal kick counts Rest/ice/heat/warm bath/Tylenol for round ligament pain      Follow-up Information    Follow [facility] with [facility] [place].   Why:  As scheduled   Contact information:   [address] [place] [id number] [phone]      [facility]   Why:  As needed for emergencies   Contact information:   [address] [id number] [address]c [place] [id number] [phone]       Medication List    STOP taking these medications        predniSONE 10 MG tablet  Commonly known as:  DELTASONE      TAKE these medications        AEROCHAMBER MV inhaler  Use as instructed     albuterol 108 (90 BASE) MCG/ACT inhaler  Commonly known as:  PROVENTIL HFA;VENTOLIN HFA  Inhale 2 puffs into the lungs every 6 (six) hours as needed for wheezing or shortness of breath.     albuterol (5 MG/ML) 0.5% nebulizer solution  Commonly known as:  PROVENTIL  Take 2.5 mg by nebulization every 6 (six) hours as needed for wheezing or shortness of breath.     budesonide-formoterol 160-4.5 MCG/ACT inhaler  Commonly known as:  SYMBICORT  Inhale 2 puffs into the lungs 2 (two) times daily.     cetirizine 10 MG tablet  Commonly known as:  ZYRTEC  Take 1 tablet (10 mg total) by mouth daily.     famotidine 20 MG tablet  Commonly known as:  PEPCID  Take 1 tablet (20 mg total) by  mouth 2 (two) times daily.     fluticasone 50 MCG/ACT nasal spray  Commonly known as:  FLONASE  Place 2 sprays into both nostrils daily as needed for allergies or rhinitis.     metroNIDAZOLE 0.75 %  vaginal gel  Commonly known as:  METROGEL  Place 1 Applicatorful vaginally at bedtime. Apply one applicatorful to vagina at bedtime for 5 days     prenatal vitamin w/FE, FA 29-1 MG Chew chewable tablet  Chew 1 tablet by mouth daily at 12 noon.        Sharen Counter Certified Nurse-Midwife 10/05/2014 5:17 AM

## 2014-10-04 NOTE — MAU Note (Signed)
Pt states that she began having lower abdominal pains described as stabbing 2 days ago. Denies vaginal discharge or vaginal bleeding. States positive fetal movement. Denies LOF/SROM. Denies contraction-like pains. No h/o MRSA or STI's per patient.

## 2014-10-04 NOTE — Discharge Instructions (Signed)
Round Ligament Pain During Pregnancy °Round ligament pain is a sharp pain or jabbing feeling often felt in the lower belly or groin area on one or both sides. It is one of the most common complaints during pregnancy and is considered a normal part of pregnancy. It is most often felt during the second trimester. ° °Here is what you need to know about round ligament pain, including some tips to help you feel better. ° °Causes of Round Ligament Pain ° °Several thick ligaments surround and support your womb (uterus) as it grows during pregnancy. One of them is called the round ligament. ° °The round ligament connects the front part of the womb to your groin, the area where your legs attach to your pelvis. The round ligament normally tightens and relaxes slowly. ° °As your baby and womb grow, the round ligament stretches. That makes it more likely to become strained. ° °Sudden movements can cause the ligament to tighten quickly, like a rubber band snapping. This causes a sudden and quick jabbing feeling. ° °Symptoms of Round Ligament Pain ° °Round ligament pain can be concerning and uncomfortable. But it is considered normal as your body changes during pregnancy. ° °The symptoms of round ligament pain include a sharp, sudden spasm in the belly. It usually affects the right side, but it may happen on both sides. The pain only lasts a few seconds. ° °Exercise may cause the pain, as will rapid movements such as: ° °sneezing °coughing °laughing °rolling over in bed °standing up too quickly ° °Treatment of Round Ligament Pain ° °Here are some tips that may help reduce your discomfort: ° °Pain relief. Take over-the-counter acetaminophen for pain, if necessary. Ask your doctor if this is OK. ° °Exercise. Get plenty of exercise to keep your stomach (core) muscles strong. Doing stretching exercises or prenatal yoga can be helpful. Ask your doctor which exercises are safe for you and your baby. ° °A helpful exercise involves  putting your hands and knees on the floor, lowering your head, and pushing your backside into the air. ° °Avoid sudden movements. Change positions slowly (such as standing up or sitting down) to avoid sudden movements that may cause stretching and pain. ° °Flex your hips. Bend and flex your hips before you cough, sneeze, or laugh to avoid pulling on the ligaments. ° °Apply warmth. A heating pad or warm bath may be helpful. Ask your doctor if this is OK. Extreme heat can be dangerous to the baby. ° °You should try to modify your daily activity level and avoid positions that may worsen the condition. ° °When to Call the Doctor/Midwife ° °Always tell your doctor or midwife about any type of pain you have during pregnancy. Round ligament pain is quick and doesn't last long. ° °Call your health care provider immediately if you have: ° °severe pain °fever °chills °pain on urination °difficulty walking ° °Belly pain during pregnancy can be due to many different causes. It is important for your doctor to rule out more serious conditions, including pregnancy complications such as placenta abruption or non-pregnancy illnesses such as: ° °inguinal hernia °appendicitis °stomach, liver, and kidney problems °Preterm labor pains may sometimes be mistaken for round ligament pain. °Bacterial Vaginosis °Bacterial vaginosis is a vaginal infection that occurs when the normal balance of bacteria in the vagina is disrupted. It results from an overgrowth of certain bacteria. This is the most common vaginal infection in women of childbearing age. Treatment is important to prevent complications, especially   in pregnant women, as it can cause a premature delivery. °CAUSES  °Bacterial vaginosis is caused by an increase in harmful bacteria that are normally present in smaller amounts in the vagina. Several different kinds of bacteria can cause bacterial vaginosis. However, the reason that the condition develops is not fully understood. °RISK  FACTORS °Certain activities or behaviors can put you at an increased risk of developing bacterial vaginosis, including: °· Having a new sex partner or multiple sex partners. °· Douching. °· Using an intrauterine device (IUD) for contraception. °Women do not get bacterial vaginosis from toilet seats, bedding, swimming pools, or contact with objects around them. °SIGNS AND SYMPTOMS  °Some women with bacterial vaginosis have no signs or symptoms. Common symptoms include: °· Grey vaginal discharge. °· A fishlike odor with discharge, especially after sexual intercourse. °· Itching or burning of the vagina and vulva. °· Burning or pain with urination. °DIAGNOSIS  °Your health care provider will take a medical history and examine the vagina for signs of bacterial vaginosis. A sample of vaginal fluid may be taken. Your health care provider will look at this sample under a microscope to check for bacteria and abnormal cells. A vaginal pH test may also be done.  °TREATMENT  °Bacterial vaginosis may be treated with antibiotic medicines. These may be given in the form of a pill or a vaginal cream. A second round of antibiotics may be prescribed if the condition comes back after treatment.  °HOME CARE INSTRUCTIONS  °· Only take over-the-counter or prescription medicines as directed by your health care provider. °· If antibiotic medicine was prescribed, take it as directed. Make sure you finish it even if you start to feel better. °· Do not have sex until treatment is completed. °· Tell all sexual partners that you have a vaginal infection. They should see their health care provider and be treated if they have problems, such as a mild rash or itching. °· Practice safe sex by using condoms and only having one sex partner. °SEEK MEDICAL CARE IF:  °· Your symptoms are not improving after 3 days of treatment. °· You have increased discharge or pain. °· You have a fever. °MAKE SURE YOU:  °· Understand these instructions. °· Will watch  your condition. °· Will get help right away if you are not doing well or get worse. °FOR MORE INFORMATION  °Centers for Disease Control and Prevention, Division of STD Prevention: www.cdc.gov/std °American Sexual Health Association (ASHA): www.ashastd.org  °Document Released: 10/07/2005 Document Revised: 07/28/2013 Document Reviewed: 05/19/2013 °[employer]® Patient Information ©2015 [employer]. This information is not intended to replace advice given to you by your health care provider. Make sure you discuss any questions you have with your health care provider. ° °

## 2014-10-05 DIAGNOSIS — R109 Unspecified abdominal pain: Secondary | ICD-10-CM

## 2014-10-05 DIAGNOSIS — Z3A24 24 weeks gestation of pregnancy: Secondary | ICD-10-CM | POA: Insufficient documentation

## 2014-10-05 DIAGNOSIS — O26899 Other specified pregnancy related conditions, unspecified trimester: Secondary | ICD-10-CM | POA: Insufficient documentation

## 2014-10-05 LAB — GC/CHLAMYDIA PROBE AMP
CT PROBE, AMP APTIMA: NEGATIVE
GC Probe RNA: NEGATIVE

## 2014-10-21 NOTE — L&D Delivery Note (Signed)
Delivery Note At 3:12 PM a viable and healthy female was delivered via Vaginal, Spontaneous Delivery (Presentation: Right Occiput Anterior).  APGAR: 8, 9; weight 5 lb 14 oz (2665 g).   Placenta status: Intact, Spontaneous.  Cord: 3 vessels with the following complications: None.    Anesthesia: Epidural  Episiotomy: None Lacerations: None Suture Repair: n/a Est. Blood Loss (mL): 100  Mom to postpartum.  Baby to Couplet care / Skin to Skin.   Stacy Blackwell is a 34 y.o. female [phone] with IUP at 4567w4d admitted for PROM.  She progressed with Cytotec and Pitocin augmentation to complete and pushed ~ 30 minutes to deliver.  Cord clamping delayed by several minutes then clamped by CNM and cut by FOB.  Placenta intact and spontaneous, bleeding minimal.  Intact perineum.  Mom and baby stable prior to transfer to postpartum. She plans on breastfeeding in hospital. She requests Nexplanon for birth control.   LEFTWICH-KIRBY, Antia Rahal 01/14/2015, 3:48 PM

## 2014-11-04 DIAGNOSIS — Z23 Encounter for immunization: Secondary | ICD-10-CM | POA: Diagnosis not present

## 2014-11-04 DIAGNOSIS — Z3482 Encounter for supervision of other normal pregnancy, second trimester: Secondary | ICD-10-CM | POA: Diagnosis not present

## 2014-11-04 DIAGNOSIS — O403XX1 Polyhydramnios, third trimester, fetus 1: Secondary | ICD-10-CM | POA: Diagnosis not present

## 2014-11-04 DIAGNOSIS — Z3483 Encounter for supervision of other normal pregnancy, third trimester: Secondary | ICD-10-CM | POA: Diagnosis not present

## 2014-11-14 ENCOUNTER — Encounter (HOSPITAL_COMMUNITY): Payer: Self-pay | Admitting: *Deleted

## 2014-11-14 ENCOUNTER — Emergency Department (HOSPITAL_COMMUNITY)
Admission: EM | Admit: 2014-11-14 | Discharge: 2014-11-14 | Disposition: A | Discharge status: Home / Self Care | Payer: Medicare Other | Attending: Emergency Medicine | Admitting: Emergency Medicine

## 2014-11-14 DIAGNOSIS — Z79899 Other long term (current) drug therapy: Secondary | ICD-10-CM | POA: Diagnosis not present

## 2014-11-14 DIAGNOSIS — Z880 Allergy status to penicillin: Secondary | ICD-10-CM | POA: Diagnosis not present

## 2014-11-14 DIAGNOSIS — Z8742 Personal history of other diseases of the female genital tract: Secondary | ICD-10-CM | POA: Diagnosis not present

## 2014-11-14 DIAGNOSIS — K219 Gastro-esophageal reflux disease without esophagitis: Secondary | ICD-10-CM | POA: Insufficient documentation

## 2014-11-14 DIAGNOSIS — M659 Synovitis and tenosynovitis, unspecified: Secondary | ICD-10-CM | POA: Diagnosis not present

## 2014-11-14 DIAGNOSIS — Z720 Tobacco use: Secondary | ICD-10-CM | POA: Diagnosis not present

## 2014-11-14 DIAGNOSIS — Z7951 Long term (current) use of inhaled steroids: Secondary | ICD-10-CM | POA: Diagnosis not present

## 2014-11-14 DIAGNOSIS — Z8659 Personal history of other mental and behavioral disorders: Secondary | ICD-10-CM | POA: Insufficient documentation

## 2014-11-14 DIAGNOSIS — J45909 Unspecified asthma, uncomplicated: Secondary | ICD-10-CM | POA: Diagnosis not present

## 2014-11-14 DIAGNOSIS — M779 Enthesopathy, unspecified: Secondary | ICD-10-CM | POA: Insufficient documentation

## 2014-11-14 DIAGNOSIS — M79645 Pain in left finger(s): Secondary | ICD-10-CM | POA: Diagnosis present

## 2014-11-14 MED ORDER — HYDROCODONE-ACETAMINOPHEN 5-325 MG PO TABS: 1.0000 | ORAL_TABLET | Freq: Four times a day (QID) | ORAL | Status: DC | PRN

## 2014-11-14 MED ORDER — PREDNISONE 50 MG PO TABS: 50.0000 mg | ORAL_TABLET | Freq: Every day | ORAL | Status: DC

## 2014-11-14 NOTE — ED Provider Notes (Signed)
CSN: [id number]     Arrival date & time 11/14/14  1235 History  This chart was scribed for non-physician practitioner, Charlestine Night, PA-C working with Enid Skeens, MD by Littie Deeds, ED Scribe. This patient was seen in room TR10C/TR10C and the patient's care was started at 1:28 PM.      Chief Complaint  Patient presents with  . Hand Pain   The history is provided by the patient. No language interpreter was used.   HPI Comments: Stacy Blackwell is a 34 y.o. female who presents to the Emergency Department complaining of gradual onset left hand pain in her 2nd, 3rd and 4th fingers that started 3 days ago. She states her pain feels similar to when she had tendonitis in her right hand. Patient reports having pain when balling up her hand into a fist. She tried ice and heat last night. She denies left arm pain and injuries to her hand.   Past Medical History  Diagnosis Date  . Asthma   . Vertigo   . Acid reflux   . Obesity   . Depression     doing good  . Ovarian cyst    Past Surgical History  Procedure Laterality Date  . Cyst excision Left 1987   Family History  Problem Relation Age of Onset  . Asthma Mother   . Diabetes Mother   . Heart disease Mother     CHF  . Asthma Sister   . Diabetes Sister   . Heart disease Sister   . Hearing loss Neg Hx    History  Substance Use Topics  . Smoking status: Current Every Day Smoker -- 0.50 packs/day for 10 years    Types: Cigarettes  . Smokeless tobacco: Never Used  . Alcohol Use: No   OB History    Gravida Para Term Preterm AB TAB SAB Ectopic Multiple Living   0 0 0 0 0 0 1     Review of Systems A complete 10 system review of systems was obtained and all systems are negative except as noted in the HPI and PMH.     Allergies  Amoxicillin  Home Medications   Prior to Admission medications   Medication Sig Start Date End Date Taking? Authorizing Provider  albuterol (PROVENTIL HFA;VENTOLIN HFA) 108 (90  BASE) MCG/ACT inhaler Inhale 2 puffs into the lungs every 6 (six) hours as needed for wheezing or shortness of breath. 08/02/14   Marny Lowenstein, PA-C  albuterol (PROVENTIL) (5 MG/ML) 0.5% nebulizer solution Take 2.5 mg by nebulization every 6 (six) hours as needed for wheezing or shortness of breath.    Historical Provider, MD  budesonide-formoterol (SYMBICORT) 160-4.5 MCG/ACT inhaler Inhale 2 puffs into the lungs 2 (two) times daily. 08/17/14   Leslye Peer, MD  cetirizine (ZYRTEC) 10 MG tablet Take 1 tablet (10 mg total) by mouth daily. 08/17/14   Leslye Peer, MD  famotidine (PEPCID) 20 MG tablet Take 1 tablet (20 mg total) by mouth 2 (two) times daily. 08/17/14   Leslye Peer, MD  fluticasone (FLONASE) 50 MCG/ACT nasal spray Place 2 sprays into both nostrils daily as needed for allergies or rhinitis. Patient not taking: Reported on 10/04/2014 08/17/14   Leslye Peer, MD  metroNIDAZOLE (METROGEL) 0.75 % vaginal gel Place 1 Applicatorful vaginally at bedtime. Apply one applicatorful to vagina at bedtime for 5 days 10/04/14   Hurshel Party, CNM  prenatal vitamin w/FE, FA (NATACHEW) 29-1 MG CHEW  chewable tablet Chew 1 tablet by mouth daily at 12 noon.    Historical Provider, MD  Spacer/Aero-Holding [place] (AEROCHAMBER MV) inhaler Use as instructed 07/01/14   Leslye Peerobert S Byrum, MD   BP 124/40 mmHg  Pulse 97  Temp(Src) 98.4 F (36.9 C) (Oral)  Resp 18  Ht 5\' 2"  (1.575 m)  Wt 260 lb (117.935 kg)  BMI 47.54 kg/m2  SpO2 97%  LMP 04/19/2014 Physical Exam  Constitutional: She is oriented to person, place, and time. She appears well-developed and well-nourished. No distress.  HENT:  Head: Normocephalic and atraumatic.  Mouth/Throat: Oropharynx is clear and moist. No oropharyngeal exudate.  Eyes: Pupils are equal, round, and reactive to light.  Neck: Neck supple.  Cardiovascular: Normal rate.   Pulmonary/Chest: Effort normal.  Musculoskeletal: She exhibits tenderness.  Pain to  2nd, 3rd and 4th fingers of left hand. Mild swelling over dorsum of hand.  Neurological: She is alert and oriented to person, place, and time. No cranial nerve deficit.  Skin: Skin is warm and dry. No rash noted.  Psychiatric: She has a normal mood and affect. Her behavior is normal.  Nursing note and vitals reviewed.   ED Course  Procedures  DIAGNOSTIC STUDIES: Oxygen Saturation is 97% on room air, normal by my interpretation.    COORDINATION OF CARE: 1:30 PM-Discussed treatment plan which includes splint with pt at bedside and pt agreed to plan.    Imaging Review No results found.   Patient will be referred to hand surgery for further evaluation and care.  She was likely a tendinitis based on the fact she has pain with range of motion over the tendons in the third and fourth digits is not appear to be infectious or a gout-like etiology based on fact.  There is no redness, increased warmth or wounds  Carlyle DollyChristopher W Niyanna Asch, PA-C 11/16/14 1547  Enid SkeensJoshua M Zavitz, MD 11/19/14 [phone]

## 2014-11-14 NOTE — Discharge Instructions (Signed)
Return here as needed. Follow up with the hand surgeon provided as needed. Ice and heat to your hand

## 2014-11-14 NOTE — ED Notes (Addendum)
Pt denies injury to left hand but is having pain, states it is similar to pain when she had carpal tunnel in right hand. No swelling or redness noted to hand. Pt is approx [redacted] weeks pregnant, denies any ob/gyn complaints.

## 2014-11-14 NOTE — ED Notes (Signed)
Declined W/C at D/C and was escorted to lobby by RN. 

## 2014-11-18 ENCOUNTER — Other Ambulatory Visit (HOSPITAL_COMMUNITY): Payer: Self-pay | Admitting: Nurse Practitioner

## 2014-11-30 ENCOUNTER — Ambulatory Visit (HOSPITAL_COMMUNITY)
Admission: RE | Admit: 2014-11-30 | Discharge: 2014-11-30 | Disposition: A | Discharge status: Home / Self Care | Payer: Medicare Other | Source: Ambulatory Visit | Attending: Nurse Practitioner | Admitting: Nurse Practitioner

## 2014-11-30 DIAGNOSIS — F17210 Nicotine dependence, cigarettes, uncomplicated: Secondary | ICD-10-CM | POA: Diagnosis not present

## 2014-11-30 DIAGNOSIS — O99333 Smoking (tobacco) complicating pregnancy, third trimester: Secondary | ICD-10-CM | POA: Insufficient documentation

## 2014-11-30 DIAGNOSIS — Z3A32 32 weeks gestation of pregnancy: Secondary | ICD-10-CM | POA: Insufficient documentation

## 2014-11-30 DIAGNOSIS — O3663X0 Maternal care for excessive fetal growth, third trimester, not applicable or unspecified: Secondary | ICD-10-CM | POA: Insufficient documentation

## 2014-12-05 IMAGING — US US MFM FETAL NUCHAL TRANSLUCENCY
1 series · 13 of 19 positions shown · non-contrast
Comparison: none

[Series 1: us mfm fetal nuchal translucency · 0.08mm/px · 13 of 19 slices shown]
[im 1/19]
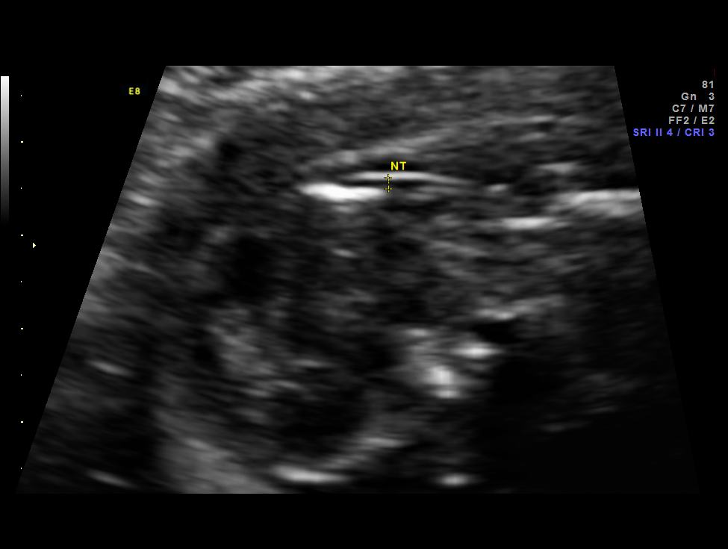
[im 3/19]
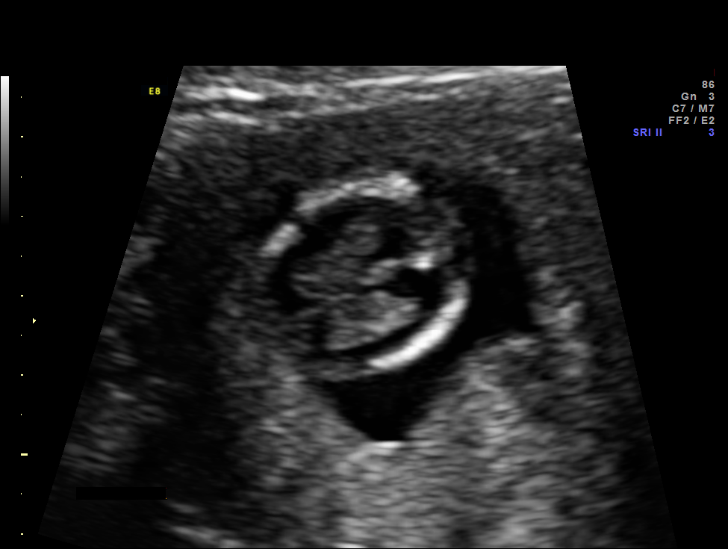
[im 4/19]
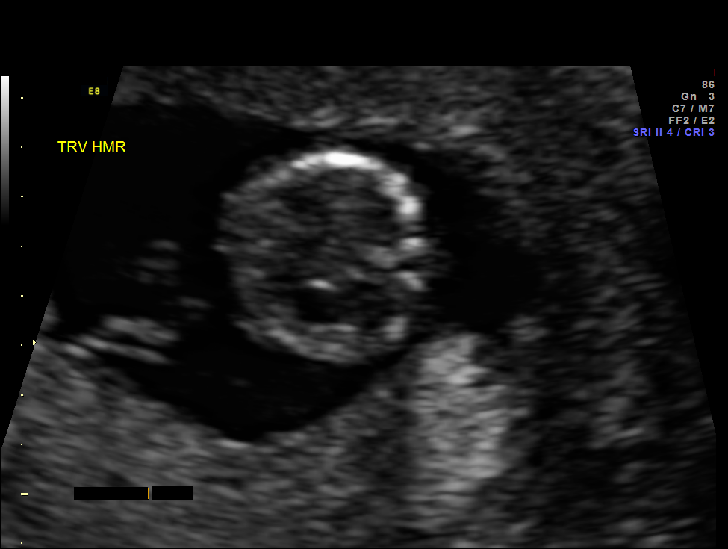
[im 6/19]
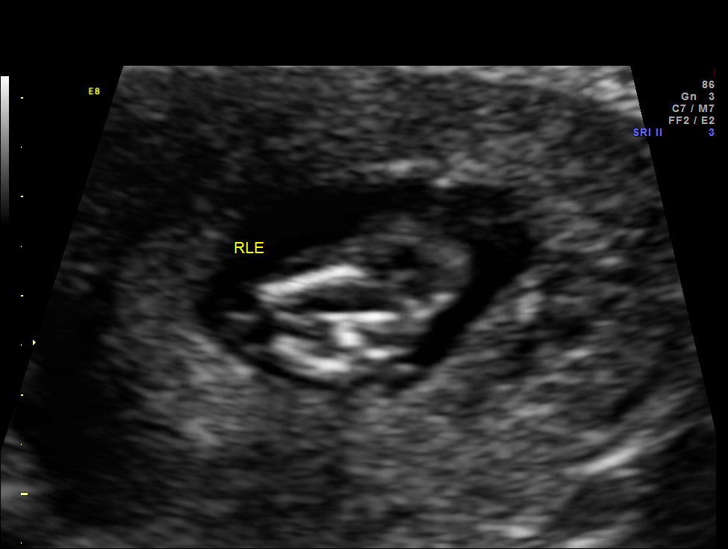
[im 7/19]
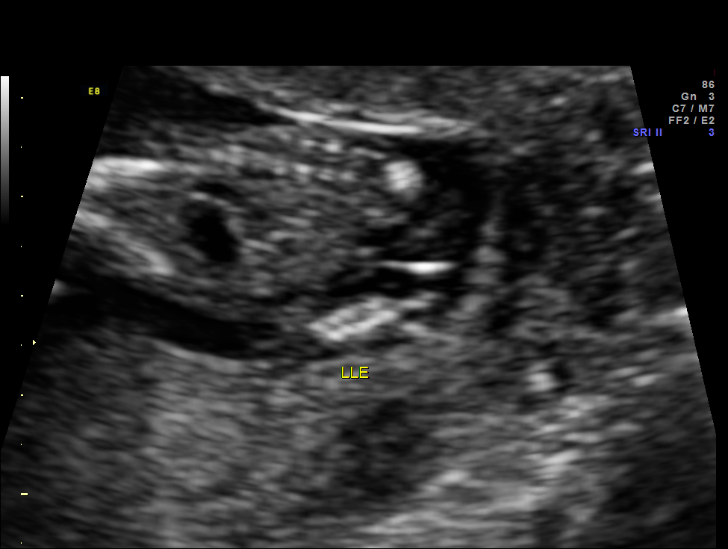
[im 9/19]
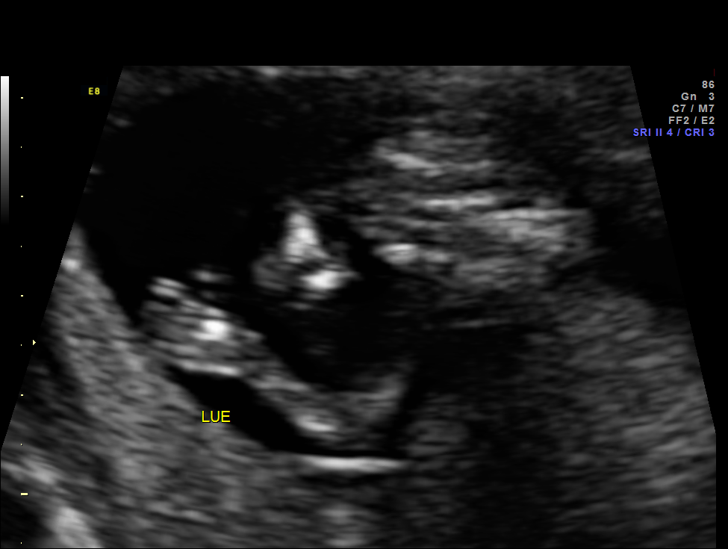
[im 10/19]
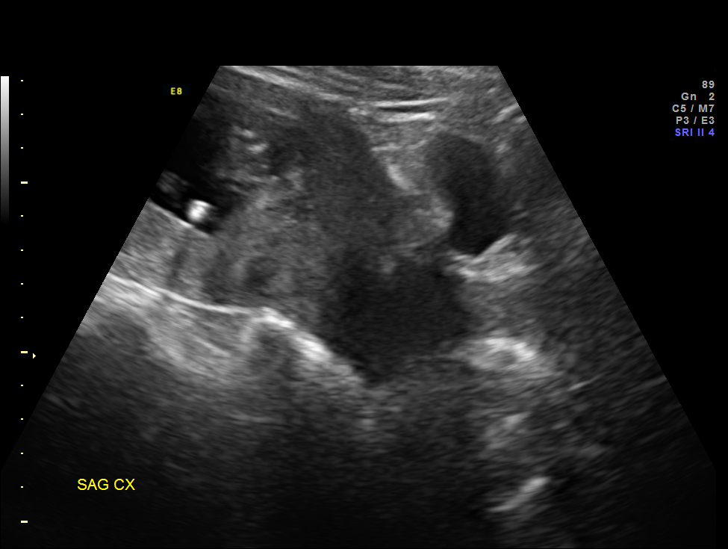
[im 11/19]
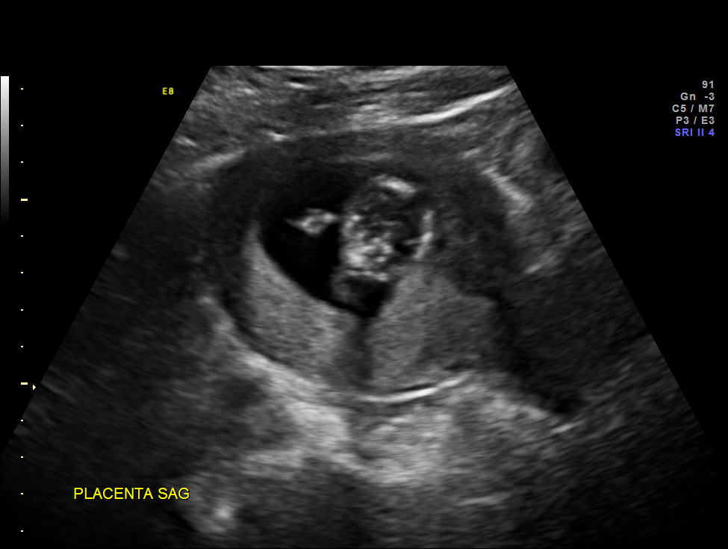
[im 13/19]
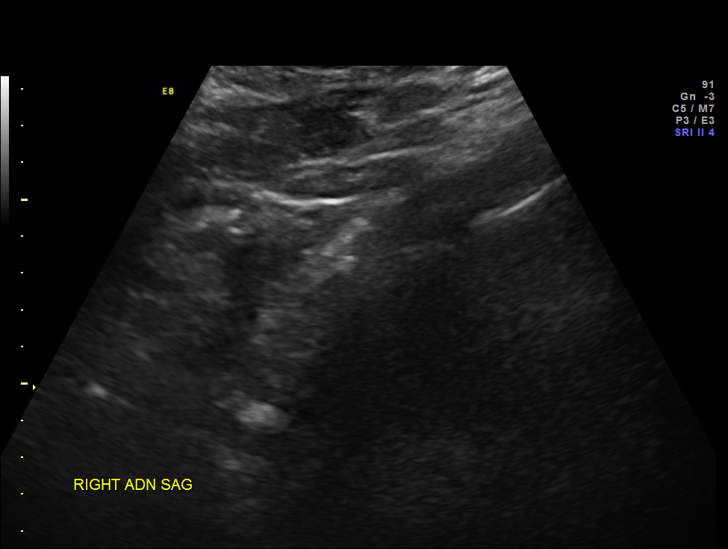
[im 14/19]
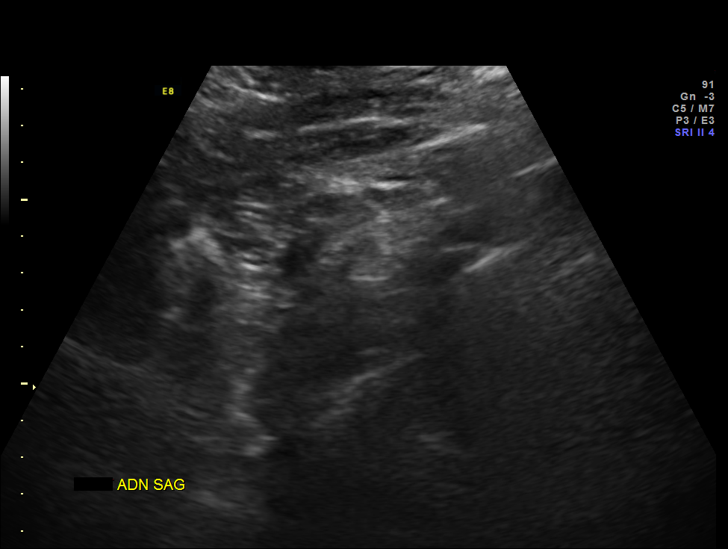
[im 16/19]
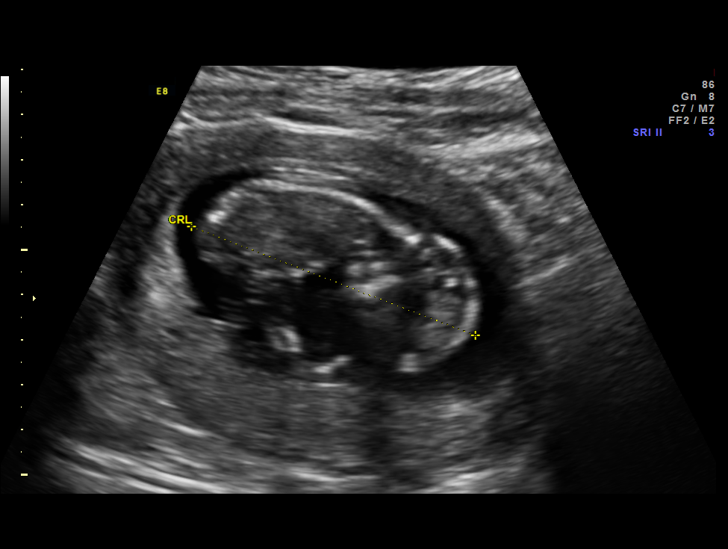
[im 17/19]
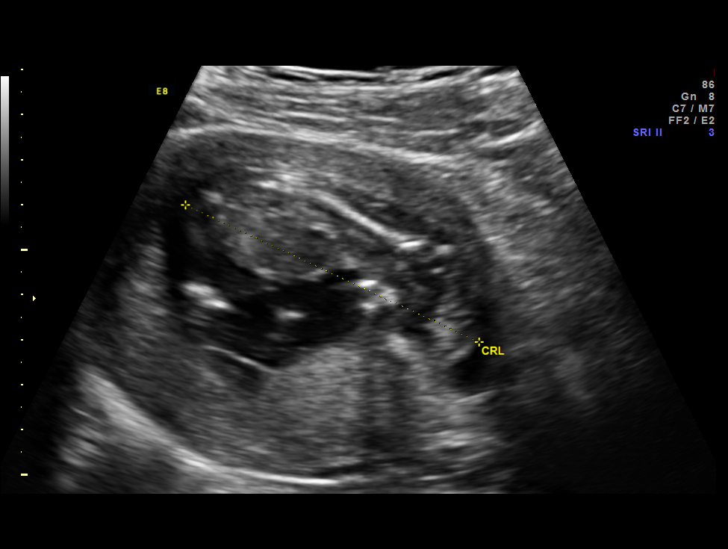
[im 19/19]
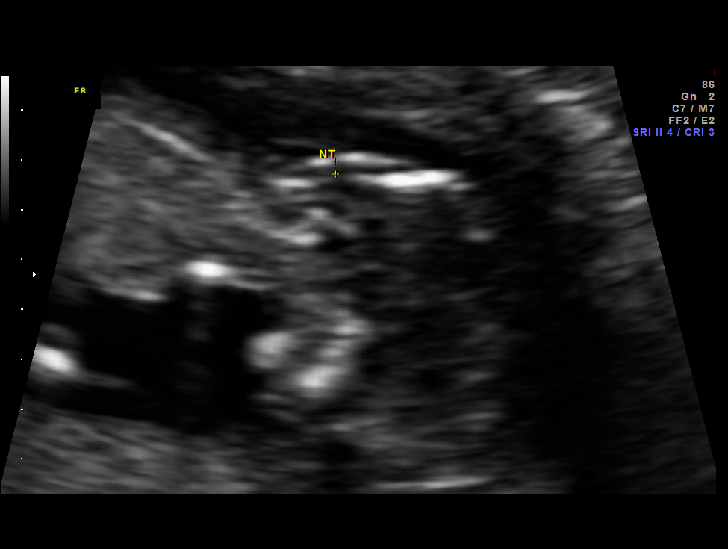

[13 of 19 positions shown; findings below may reference images not displayed]

OBSTETRICS REPORT
                      (Signed Final 07/20/2014 [DATE])

Service(s) Provided

Indications

 First trimester aneuploidy screen (NT) - Failed
 attempt
 Cigarette smoker
 Maternal obesity
Fetal Evaluation

 Num Of Fetuses:    1
 Fetal Heart Rate:  157                          bpm
 Cardiac Activity:  Observed
 Presentation:      Transverse, head to
                    maternal right
 Placenta:          Posterior

 Amniotic Fluid
 AFI FV:      Subjectively within normal limits
Gestational Age

 LMP:           13w 1d        Date:  04/19/14                 EDD:   01/24/15
 Best:          13w 1d     Det. By:  LMP  (04/19/14)          EDD:   01/24/15
1st Trimester Genetic Sonogram Screening

 CRL:            71.5  mm    G. Age:   13w 1d                 EDD:   01/24/15
Anatomy

 Cranium:          Appears normal         Cord Vessels:     Appears normal (3
                                                            vessel cord)
 Choroid Plexus:   Appears normal         Lower             Visualized
                                          Extremities:
 Stomach:          Appears normal, left   Upper             Visualized
                   sided                  Extremities:

 Other:  Technically difficult due to maternal habitus and fetal position.
Cervix Uterus Adnexa

 Cervix:       Normal appearance by transabdominal scan.
 Adnexa:     No abnormality visualized.
Impression

 Single IUP at 13w 1d
 Unable to measure an adequate NT due to fetal position (2nd
 attempt)
 Nasal bone not visualized due to position
Recommendations

 Offer quad screen to screen aneuploidy at 15-20 weeks.
 Recommend ultrasound for fetal anatomy at 18-20 weeks.

## 2014-12-13 ENCOUNTER — Inpatient Hospital Stay (HOSPITAL_COMMUNITY)
Admission: AD | Admit: 2014-12-13 | Discharge: 2014-12-13 | Discharge status: Against Medical Advice | Payer: Medicare Other | Source: Ambulatory Visit | Attending: Family Medicine | Admitting: Family Medicine

## 2014-12-13 ENCOUNTER — Encounter (HOSPITAL_COMMUNITY): Payer: Self-pay | Admitting: *Deleted

## 2014-12-13 DIAGNOSIS — W010XXA Fall on same level from slipping, tripping and stumbling without subsequent striking against object, initial encounter: Secondary | ICD-10-CM | POA: Diagnosis not present

## 2014-12-13 DIAGNOSIS — O9A213 Injury, poisoning and certain other consequences of external causes complicating pregnancy, third trimester: Secondary | ICD-10-CM

## 2014-12-13 DIAGNOSIS — O99333 Smoking (tobacco) complicating pregnancy, third trimester: Secondary | ICD-10-CM | POA: Insufficient documentation

## 2014-12-13 DIAGNOSIS — O368130 Decreased fetal movements, third trimester, not applicable or unspecified: Secondary | ICD-10-CM | POA: Diagnosis not present

## 2014-12-13 DIAGNOSIS — Y92008 Other place in unspecified non-institutional (private) residence as the place of occurrence of the external cause: Secondary | ICD-10-CM | POA: Insufficient documentation

## 2014-12-13 DIAGNOSIS — F17210 Nicotine dependence, cigarettes, uncomplicated: Secondary | ICD-10-CM | POA: Insufficient documentation

## 2014-12-13 DIAGNOSIS — O368131 Decreased fetal movements, third trimester, fetus 1: Secondary | ICD-10-CM

## 2014-12-13 DIAGNOSIS — Z3A34 34 weeks gestation of pregnancy: Secondary | ICD-10-CM | POA: Diagnosis not present

## 2014-12-13 NOTE — MAU Provider Note (Signed)
Chief Complaint:  Fall and Decreased Fetal Movement  First Provider Initiated Contact with Patient 12/13/14 at None (left AMA prior to to being seen by provider)  HPI: Stacy Blackwell is a 34 y.o. G2P1001 at [redacted]w[redacted]d who presents to maternity admissions reporting slipping on her wet porch today and sliding into a stradle split. Also reports decrease FM per RN. Denies abd contact, VB, LPF ro contractions.  Past Medical History: Past Medical History  Diagnosis Date  . Asthma   . Vertigo   . Acid reflux   . Obesity   . Depression     doing good  . Ovarian cyst     Past obstetric history: OB History  Gravida Para Term Preterm AB SAB TAB Ectopic Multiple Living  0 0 0 0 0 0 1    # Outcome Date GA Lbr Len/2nd Weight Sex Delivery Anes PTL Lv  2 Current           1 Term 03/03/08    F Vag-Spont EPI N Y      Past Surgical History: Past Surgical History  Procedure Laterality Date  . Cyst excision Left 1987     Family History: Family History  Problem Relation Age of Onset  . Asthma Mother   . Diabetes Mother   . Heart disease Mother     CHF  . Asthma Sister   . Diabetes Sister   . Heart disease Sister   . Hearing loss Neg Hx     Social History: History  Substance Use Topics  . Smoking status: Current Every Day Smoker -- 0.50 packs/day for 10 years    Types: Cigarettes  . Smokeless tobacco: Never Used  . Alcohol Use: No    Allergies:  Allergies  Allergen Reactions  . Amoxicillin Hives and Swelling    Swelling in the eyes    Meds:  Prescriptions prior to admission  Medication Sig Dispense Refill Last Dose  . acetaminophen (TYLENOL) 325 MG tablet Take 650 mg by mouth every 6 (six) hours as needed for headache.   Past Week at Unknown time  . albuterol (PROVENTIL HFA;VENTOLIN HFA) 108 (90 BASE) MCG/ACT inhaler Inhale 2 puffs into the lungs every 6 (six) hours as needed for wheezing or shortness of breath. 1 Inhaler 0 12/13/2014 at Unknown time  . albuterol  (PROVENTIL) (5 MG/ML) 0.5% nebulizer solution Take 2.5 mg by nebulization every 6 (six) hours as needed for wheezing or shortness of breath.   Past Week at Unknown time  . budesonide-formoterol (SYMBICORT) 160-4.5 MCG/ACT inhaler Inhale 2 puffs into the lungs 2 (two) times daily. 1 Inhaler 6 12/13/2014 at Unknown time  . cetirizine (ZYRTEC) 10 MG tablet Take 1 tablet (10 mg total) by mouth daily. 30 tablet 5 12/13/2014 at Unknown time  . famotidine (PEPCID) 20 MG tablet Take 1 tablet (20 mg total) by mouth 2 (two) times daily. 30 tablet 0 12/13/2014 at Unknown time  . HYDROcodone-acetaminophen (NORCO/VICODIN) 5-325 MG per tablet Take 1 tablet by mouth every 6 (six) hours as needed for moderate pain. 15 tablet 0 Past Month at Unknown time  . prenatal vitamin w/FE, FA (NATACHEW) 29-1 MG CHEW chewable tablet Chew 1 tablet by mouth daily at 12 noon.   12/12/2014 at Unknown time  . fluticasone (FLONASE) 50 MCG/ACT nasal spray Place 2 sprays into both nostrils daily as needed for allergies or rhinitis. (Patient not taking: Reported on 10/04/2014) 16 g 2 Not Taking at Unknown time  .  metroNIDAZOLE (METROGEL) 0.75 % vaginal gel Place 1 Applicatorful vaginally at bedtime. Apply one applicatorful to vagina at bedtime for 5 days (Patient not taking: Reported on 12/13/2014) 70 g 1 Completed Course at Unknown time  . predniSONE (DELTASONE) 50 MG tablet Take 1 tablet (50 mg total) by mouth daily with breakfast. (Patient not taking: Reported on 12/13/2014) 5 tablet 0 Completed Course at Unknown time  . Spacer/Aero-Holding [place] (AEROCHAMBER MV) inhaler Use as instructed (Patient not taking: Reported on 12/13/2014) 1 each 0 Not Taking at Unknown time    ROS:  ROS  Physical Exam  Blood pressure 133/71, pulse 95, temperature 98.1 F (36.7 C), temperature source Oral, resp. rate 20, weight 122.925 kg (271 lb), last menstrual period 04/19/2014. Exam not performed because ept left AMA prior to provider arrival.       FHT:  Baseline 145 , moderate variability, 10x10 accelerations present, no decelerations Contractions: 1 UC.  Labs: No results found for this or any previous visit (from the past 24 hour(s)).  Imaging:    MAU Course: 30 minutes after arrival pt requesting to leave. RN explained that we routinely do prolonged monitoring for pt's that fall. Pt refused. Stated that she was feeling baby moving well now and left AMA.   Assessment:   ICD-9-CM ICD-10-CM   1. Decreased fetal movement, third trimester, fetus 1 655.73 O36.8131    651.93    2. Traumatic injury during pregnancy in third trimester 648.93 O9A.213 CANCELED: US OB Limited   959.9 T14.90 CANCELED: US OB Limited    Plan: Left AMA      Follow-up Information    Follow up with [facility]ALTH DEPT [place].   Why:  As scheduled   Contact information:   [address] [place] [address] [phone]      Follow up with [facility] [place] MATERNITY ADMISSIONS.   Why:  As needed in emergencies   Contact information:   [address] [id number] mc [place] [address] [phone]       Medication List    STOP taking these medications        AEROCHAMBER MV inhaler     fluticasone 50 MCG/ACT nasal spray  Commonly known as:  FLONASE     metroNIDAZOLE 0.75 % vaginal gel  Commonly known as:  METROGEL     predniSONE 50 MG tablet  Commonly known as:  DELTASONE      TAKE these medications        acetaminophen 325 MG tablet  Commonly known as:  TYLENOL  Take 650 mg by mouth every 6 (six) hours as needed for headache.     albuterol 108 (90 BASE) MCG/ACT inhaler  Commonly known as:  PROVENTIL HFA;VENTOLIN HFA  Inhale 2 puffs into the lungs every 6 (six) hours as needed for wheezing or shortness of breath.     albuterol (5 MG/ML) 0.5% nebulizer solution  Commonly known as:  PROVENTIL  Take 2.5 mg by nebulization every 6 (six) hours as needed for wheezing or  shortness of breath.     budesonide-formoterol 160-4.5 MCG/ACT inhaler  Commonly known as:  SYMBICORT  Inhale 2 puffs into the lungs 2 (two) times daily.     cetirizine 10 MG tablet  Commonly known as:  ZYRTEC  Take 1 tablet (10 mg total) by mouth daily.     famotidine 20 MG tablet  Commonly known as:  PEPCID  Take 1 tablet (20 mg total) by mouth 2 (  two) times daily.     HYDROcodone-acetaminophen 5-325 MG per tablet  Commonly known as:  NORCO/VICODIN  Take 1 tablet by mouth every 6 (six) hours as needed for moderate pain.     prenatal vitamin w/FE, FA 29-1 MG Chew chewable tablet  Chew 1 tablet by mouth daily at 12 noon.        [place] Bonna Steury, [place] 12/13/2014 4:58 PM

## 2014-12-13 NOTE — Discharge Instructions (Signed)
What Do I Need to Know About Injuries During Pregnancy? °Injuries can happen during pregnancy. Minor falls and accidents usually do not harm you or your baby. However, any injury should be reported to your doctor. °WHAT CAN I DO TO PROTECT MYSELF FROM INJURIES? °· Remove rugs and loose objects on the floor. °· Wear comfortable shoes that have a good grip. Do not wear high-heeled shoes. °· Always wear your seat belt. The lap belt should be below your belly. Always practice safe driving. °· Do not ride on a motorcycle. °· Do not participate in high-impact activities or sports. °· Avoid: °¨ Walking on wet or slippery floors. °¨ Fires. °¨ Starting fires. °¨ Lifting heavy pots of boiling or hot liquids. °¨ Fixing electrical problems. °· Only take medicine as told by your doctor. °· Know your blood type and the blood type of the baby's father. °· Call your local emergency services (911 in the [place] if you are a victim of domestic violence or assault. For help and support, contact the National Domestic Violence Hotline. °WHEN SHOULD I GET HELP RIGHT AWAY? °· You fall on your belly or have any high-impact accident or injury. °· You have been a victim of domestic violence or any kind of violence. °· You have been in a car accident. °· You have bleeding from your vagina. °· Fluid is leaking from your vagina. °· You start to have belly cramping (contractions) or pain. °· You feel weak or pass out (faint). °· You start to throw up (vomit) after an injury. °· You have been burned. °· You have a stiff neck or neck pain. °· You get a headache or have vision problems after an injury. °· You do not feel the baby move or the baby is not moving as much as normal. °Document Released: 11/09/2010 Document Revised: 02/21/2014 Document Reviewed: 07/14/2013 °Exit[employer]® Patient Information ©2015 [employer]. This information is not intended to replace advice given to you by your health care provider. Make sure you discuss any questions you  have with your health care provider. ° °

## 2014-12-13 NOTE — Progress Notes (Addendum)
Patient asking how long she will be here. Informed patient that MD will see her, but usually extended monitoring is desired in the event of a fall to assure fetal well being.

## 2014-12-13 NOTE — MAU Note (Signed)
letting dog out, opened door slipped and fell.  Having pain LLQ, has not felt  Movement since.

## 2014-12-13 NOTE — Progress Notes (Signed)
Patient called out saying that she cannot stay this long and must leave. Informed patient that MD will see her soon. Informed that extended monitoring may be ordered to assure her baby is doing ok. Patient states that the baby is moving well now and she cannot and will not stay any longer. Patient signed AMA form. Ivonne AndrewV. Smith, CNM notified.

## 2014-12-13 NOTE — MAU Note (Signed)
Urine in lab 

## 2014-12-20 ENCOUNTER — Emergency Department (HOSPITAL_COMMUNITY)
Admission: EM | Admit: 2014-12-20 | Discharge: 2014-12-20 | Disposition: A | Discharge status: Home / Self Care | Payer: Medicare Other | Attending: Emergency Medicine | Admitting: Emergency Medicine

## 2014-12-20 ENCOUNTER — Emergency Department (HOSPITAL_COMMUNITY): Payer: Medicare Other

## 2014-12-20 ENCOUNTER — Encounter (HOSPITAL_COMMUNITY): Payer: Self-pay | Admitting: Pediatrics

## 2014-12-20 DIAGNOSIS — Z720 Tobacco use: Secondary | ICD-10-CM | POA: Insufficient documentation

## 2014-12-20 DIAGNOSIS — Z8659 Personal history of other mental and behavioral disorders: Secondary | ICD-10-CM | POA: Diagnosis not present

## 2014-12-20 DIAGNOSIS — J4521 Mild intermittent asthma with (acute) exacerbation: Secondary | ICD-10-CM | POA: Diagnosis not present

## 2014-12-20 DIAGNOSIS — R059 Cough, unspecified: Secondary | ICD-10-CM

## 2014-12-20 DIAGNOSIS — E669 Obesity, unspecified: Secondary | ICD-10-CM | POA: Insufficient documentation

## 2014-12-20 DIAGNOSIS — Z79899 Other long term (current) drug therapy: Secondary | ICD-10-CM | POA: Diagnosis not present

## 2014-12-20 DIAGNOSIS — Z7952 Long term (current) use of systemic steroids: Secondary | ICD-10-CM | POA: Diagnosis not present

## 2014-12-20 DIAGNOSIS — J069 Acute upper respiratory infection, unspecified: Secondary | ICD-10-CM | POA: Insufficient documentation

## 2014-12-20 DIAGNOSIS — Z8742 Personal history of other diseases of the female genital tract: Secondary | ICD-10-CM | POA: Diagnosis not present

## 2014-12-20 DIAGNOSIS — Z7951 Long term (current) use of inhaled steroids: Secondary | ICD-10-CM | POA: Insufficient documentation

## 2014-12-20 DIAGNOSIS — R05 Cough: Secondary | ICD-10-CM | POA: Diagnosis not present

## 2014-12-20 DIAGNOSIS — R509 Fever, unspecified: Secondary | ICD-10-CM | POA: Diagnosis not present

## 2014-12-20 DIAGNOSIS — K219 Gastro-esophageal reflux disease without esophagitis: Secondary | ICD-10-CM | POA: Diagnosis not present

## 2014-12-20 DIAGNOSIS — F17210 Nicotine dependence, cigarettes, uncomplicated: Secondary | ICD-10-CM | POA: Diagnosis not present

## 2014-12-20 DIAGNOSIS — Z880 Allergy status to penicillin: Secondary | ICD-10-CM | POA: Insufficient documentation

## 2014-12-20 MED ORDER — PREDNISONE 20 MG PO TABS: 40.0000 mg | ORAL_TABLET | Freq: Every day | ORAL | Status: DC

## 2014-12-20 MED ORDER — PREDNISONE 20 MG PO TABS
60.0000 mg | ORAL_TABLET | Freq: Once | ORAL | Status: AC
  Administered 2014-12-20: 60 mg via ORAL
  Filled 2014-12-20: qty 3

## 2014-12-20 MED ORDER — IPRATROPIUM BROMIDE 0.02 % IN SOLN
0.5000 mg | Freq: Once | RESPIRATORY_TRACT | Status: AC
  Administered 2014-12-20: 0.5 mg via RESPIRATORY_TRACT
  Filled 2014-12-20: qty 2.5

## 2014-12-20 MED ORDER — ALBUTEROL SULFATE (2.5 MG/3ML) 0.083% IN NEBU
5.0000 mg | INHALATION_SOLUTION | Freq: Once | RESPIRATORY_TRACT | Status: AC
  Administered 2014-12-20: 5 mg via RESPIRATORY_TRACT
  Filled 2014-12-20: qty 6

## 2014-12-20 NOTE — ED Notes (Signed)
PA aware that pt is here

## 2014-12-20 NOTE — ED Provider Notes (Signed)
CSN: [id number]     Arrival date & time 12/20/14  0707 History   First MD Initiated Contact with Patient 12/20/14 [phone]     Chief Complaint  Patient presents with  . Cough  . Emesis     (Consider location/radiation/quality/duration/timing/severity/associated sxs/prior Treatment) HPI  This is a 34 year old female who is [redacted] weeks pregnant who presents emergency Department with chief complaint of productive cough. She has associated symptoms of URI which include runny nose. She denies sore throat, ear pain, fevers or chills. Patient has a history of asthma and is currently on Symbicort and albuterol. 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 The patient is also a daily smoker and has cut back to 2 cigarettes daily since she has been pregnant, but continues to smoke through her pregnancy. The patient is here with her daughter who has similar symptoms over the past 2 days. The patient complains of severe coughing, asthma exacerbation. Her symptoms are worse at night. She had several episodes of post tussive vomiting. She denies any other symptoms at this time.   Past Medical History  Diagnosis Date  . Asthma   . Vertigo   . Acid reflux   . Obesity   . Depression     doing good  . Ovarian cyst    Past Surgical History  Procedure Laterality Date  . Cyst excision Left 1987   Family History  Problem Relation Age of Onset  . Asthma Mother   . Diabetes Mother   . Heart disease Mother     CHF  . Asthma Sister   . Diabetes Sister   . Heart disease Sister   . Hearing loss Neg Hx    History  Substance Use Topics  . Smoking status: Current Every Day Smoker -- 0.50 packs/day for 10 years    Types: Cigarettes  . Smokeless tobacco: Never Used  . Alcohol Use: No   OB History    Gravida Para Term Preterm AB TAB SAB Ectopic Multiple Living   2 1 1  0 0 0 0 0 0 1     Review of Systems  Ten systems reviewed and are negative for acute change, except as noted in the HPI.    Allergies   Amoxicillin  Home Medications   Prior to Admission medications   Medication Sig Start Date End Date Taking? Authorizing Provider  acetaminophen (TYLENOL) 325 MG tablet Take 650 mg by mouth every 6 (six) hours as needed for headache.    Historical Provider, MD  albuterol (PROVENTIL HFA;VENTOLIN HFA) 108 (90 BASE) MCG/ACT inhaler Inhale 2 puffs into the lungs every 6 (six) hours as needed for wheezing or shortness of breath. 08/02/14   Marny LowensteinJulie N Wenzel, PA-C  albuterol (PROVENTIL) (5 MG/ML) 0.5% nebulizer solution Take 2.5 mg by nebulization every 6 (six) hours as needed for wheezing or shortness of breath.    Historical Provider, MD  budesonide-formoterol (SYMBICORT) 160-4.5 MCG/ACT inhaler Inhale 2 puffs into the lungs 2 (two) times daily. 08/17/14   Leslye Peerobert S Byrum, MD  cetirizine (ZYRTEC) 10 MG tablet Take 1 tablet (10 mg total) by mouth daily. 08/17/14   Leslye Peerobert S Byrum, MD  famotidine (PEPCID) 20 MG tablet Take 1 tablet (20 mg total) by mouth 2 (two) times daily. 08/17/14   Leslye Peerobert S Byrum, MD  HYDROcodone-acetaminophen (NORCO/VICODIN) 5-325 MG per tablet Take 1 tablet by mouth every 6 (six) hours as needed for moderate pain. 11/14/14   Jamesetta Orleanshristopher W Lawyer, PA-C  predniSONE (DELTASONE) 20 MG tablet Take  2 tablets (40 mg total) by mouth daily. 12/20/14   Arthor Captain, PA-C  prenatal vitamin w/FE, FA (NATACHEW) 29-1 MG CHEW chewable tablet Chew 1 tablet by mouth daily at 12 noon.    Historical Provider, MD   BP 93/57 mmHg  Pulse 84  Temp(Src) 98.4 F (36.9 C) (Oral)  Resp 22  Wt 269 lb 6.4 oz (122.199 kg)  SpO2 100%  LMP 04/19/2014 Physical Exam  Constitutional: She is oriented to person, place, and time. She appears well-developed and well-nourished. No distress.  HENT:  Head: Normocephalic and atraumatic.  Eyes: Conjunctivae are normal. No scleral icterus.  Neck: Normal range of motion.  Cardiovascular: Normal rate, regular rhythm and normal heart sounds.  Exam reveals no gallop and no  friction rub.   No murmur heard. Pulmonary/Chest: Effort normal. No respiratory distress. She has wheezes. She has no rales. She exhibits no tenderness.  Patient with diffuse inspiratory and expiratory wheezes. She has a productive cough. Oxygen saturations 98% on room air.  Abdominal: Soft. Bowel sounds are normal. She exhibits no distension and no mass. There is no tenderness. There is no guarding.  Neurological: She is alert and oriented to person, place, and time.  Skin: Skin is warm and dry. She is not diaphoretic.    ED Course  Procedures (including critical care time) Labs Review Labs Reviewed - No data to display  Imaging Review Dg Chest 2 View  12/20/2014   CLINICAL DATA:  Cough and fever for 2 days, [redacted] weeks pregnant  EXAM: CHEST  2 VIEW  COMPARISON:  04/05/2014  FINDINGS: Cardiomediastinal silhouette is stable. No acute infiltrate or pleural effusion. Stable chronic mild interstitial prominence. Mild thoracic dextroscoliosis.  IMPRESSION: No active disease.  Stable chronic mild interstitial prominence.  The patient's abdomen was double shielded during examination.   Electronically Signed   By: Natasha Mead M.D.   On: 12/20/2014 08:29     EKG Interpretation None      MDM   Final diagnoses:  Cough  URI (upper respiratory infection)  Asthma, mild intermittent, with acute exacerbation   9:44 AM BP 93/57 mmHg  Pulse 84  Temp(Src) 98.4 F (36.9 C) (Oral)  Resp 22  Wt 269 lb 6.4 oz (122.199 kg)  SpO2 100%  LMP 04/19/2014 Patient with upper respiratory infection. X-ray shows no pneumonia. Patient given a 5 and 5 DuoNeb neb here in the emergency department with significant improvement in her symptoms. Patient given dose of oral prednisone here. She will get a 5 day burst upon discharge. I reviewed current literature from up to date on prednisone use during pregnancy and this appears safe, although it is listed as category C. I also discussed treatment options with my attending  physician, Dr. Judd Lien, who agrees with plan of care. I discussed return precautions with the mother. She appears reasonable understands the plan of care and agrees with treatment. Patient appears safe for discharge at this time  Arthor Captain, PA-C 12/20/14 0945  Arthor Captain, PA-C 12/20/14 4098  Geoffery Lyons, MD 12/20/14 1201

## 2014-12-20 NOTE — ED Notes (Addendum)
Pt here with c/o cough and emesis which started two days ago. Pt states that she has a productive cough.Hx asthma. Took albuterol at home-last dose was yesterday. Afebrile. Pt also c/o frequent post tussive emesis over the past 24 hrs. PO WNL. No meds PTA. Pt is [redacted] weeks pregnant

## 2014-12-20 NOTE — Discharge Instructions (Signed)
Upper Respiratory Infection, Adult An upper respiratory infection (URI) is also sometimes known as the common cold. The upper respiratory tract includes the nose, sinuses, throat, trachea, and bronchi. Bronchi are the airways leading to the lungs. Most people improve within 1 week, but symptoms can last up to 2 weeks. A residual cough may last even longer.  CAUSES Many different viruses can infect the tissues lining the upper respiratory tract. The tissues become irritated and inflamed and often become very moist. Mucus production is also common. A cold is contagious. You can easily spread the virus to others by oral contact. This includes kissing, sharing a glass, coughing, or sneezing. Touching your mouth or nose and then touching a surface, which is then touched by another person, can also spread the virus. SYMPTOMS  Symptoms typically develop 1 to 3 days after you come in contact with a cold virus. Symptoms vary from person to person. They may include:  Runny nose.  Sneezing.  Nasal congestion.  Sinus irritation.  Sore throat.  Loss of voice (laryngitis).  Cough.  Fatigue.  Muscle aches.  Loss of appetite.  Headache.  Low-grade fever. DIAGNOSIS  You might diagnose your own cold based on familiar symptoms, since most people get a cold 2 to 3 times a year. Your caregiver can confirm this based on your exam. Most importantly, your caregiver can check that your symptoms are not due to another disease such as strep throat, sinusitis, pneumonia, asthma, or epiglottitis. Blood tests, throat tests, and X-rays are not necessary to diagnose a common cold, but they may sometimes be helpful in excluding other more serious diseases. Your caregiver will decide if any further tests are required. RISKS AND COMPLICATIONS  You may be at risk for a more severe case of the common cold if you smoke cigarettes, have chronic heart disease (such as heart failure) or lung disease (such as asthma), or if  you have a weakened immune system. The very young and very old are also at risk for more serious infections. Bacterial sinusitis, middle ear infections, and bacterial pneumonia can complicate the common cold. The common cold can worsen asthma and chronic obstructive pulmonary disease (COPD). Sometimes, these complications can require emergency medical care and may be life-threatening. PREVENTION  The best way to protect against getting a cold is to practice good hygiene. Avoid oral or hand contact with people with cold symptoms. Wash your hands often if contact occurs. There is no clear evidence that vitamin C, vitamin E, echinacea, or exercise reduces the chance of developing a cold. However, it is always recommended to get plenty of rest and practice good nutrition. TREATMENT  Treatment is directed at relieving symptoms. There is no cure. Antibiotics are not effective, because the infection is caused by a virus, not by bacteria. Treatment may include:  Increased fluid intake. Sports drinks offer valuable electrolytes, sugars, and fluids.  Breathing heated mist or steam (vaporizer or shower).  Eating chicken soup or other clear broths, and maintaining good nutrition.  Getting plenty of rest.  Using gargles or lozenges for comfort.  Controlling fevers with ibuprofen or acetaminophen as directed by your caregiver.  Increasing usage of your inhaler if you have asthma. Zinc gel and zinc lozenges, taken in the first 24 hours of the common cold, can shorten the duration and lessen the severity of symptoms. Pain medicines may help with fever, muscle aches, and throat pain. A variety of non-prescription medicines are available to treat congestion and runny nose. Your caregiver   can make recommendations and may suggest nasal or lung inhalers for other symptoms.  HOME CARE INSTRUCTIONS   Only take over-the-counter or prescription medicines for pain, discomfort, or fever as directed by your  caregiver.  Use a warm mist humidifier or inhale steam from a shower to increase air moisture. This may keep secretions moist and make it easier to breathe.  Drink enough water and fluids to keep your urine clear or pale yellow.  Rest as needed.  Return to work when your temperature has returned to normal or as your caregiver advises. You may need to stay home longer to avoid infecting others. You can also use a face mask and careful hand washing to prevent spread of the virus. SEEK MEDICAL CARE IF:   After the first few days, you feel you are getting worse rather than better.  You need your caregiver's advice about medicines to control symptoms.  You develop chills, worsening shortness of breath, or brown or red sputum. These may be signs of pneumonia.  You develop yellow or brown nasal discharge or pain in the face, especially when you bend forward. These may be signs of sinusitis.  You develop a fever, swollen neck glands, pain with swallowing, or white areas in the back of your throat. These may be signs of strep throat. SEEK IMMEDIATE MEDICAL CARE IF:   You have a fever.  You develop severe or persistent headache, ear pain, sinus pain, or chest pain.  You develop wheezing, a prolonged cough, cough up blood, or have a change in your usual mucus (if you have chronic lung disease).  You develop sore muscles or a stiff neck. Document Released: 04/02/2001 Document Revised: 12/30/2011 Document Reviewed: 01/12/2014 [employer] Patient Information 2015 [employer]. This information is not intended to replace advice given to you by your health care provider. Make sure you discuss any questions you have with your health care provider.  

## 2014-12-28 ENCOUNTER — Other Ambulatory Visit: Payer: Self-pay | Admitting: Emergency Medicine

## 2014-12-28 MED ORDER — FLUTICASONE PROPIONATE 50 MCG/ACT NA SUSP: 2.0000 | Freq: Every day | NASAL | Status: DC

## 2014-12-30 ENCOUNTER — Other Ambulatory Visit (HOSPITAL_COMMUNITY): Payer: Self-pay | Admitting: Nurse Practitioner

## 2014-12-30 DIAGNOSIS — Z36 Encounter for antenatal screening of mother: Secondary | ICD-10-CM | POA: Diagnosis not present

## 2014-12-30 DIAGNOSIS — Z3483 Encounter for supervision of other normal pregnancy, third trimester: Secondary | ICD-10-CM | POA: Diagnosis not present

## 2014-12-30 DIAGNOSIS — O403XX1 Polyhydramnios, third trimester, fetus 1: Secondary | ICD-10-CM | POA: Diagnosis not present

## 2014-12-30 DIAGNOSIS — Z23 Encounter for immunization: Secondary | ICD-10-CM | POA: Diagnosis not present

## 2014-12-30 DIAGNOSIS — Z3482 Encounter for supervision of other normal pregnancy, second trimester: Secondary | ICD-10-CM | POA: Diagnosis not present

## 2014-12-30 DIAGNOSIS — O321XX2 Maternal care for breech presentation, fetus 2: Secondary | ICD-10-CM

## 2014-12-30 LAB — OB RESULTS CONSOLE GBS: STREP GROUP B AG: NEGATIVE

## 2015-01-03 ENCOUNTER — Ambulatory Visit (HOSPITAL_COMMUNITY)
Admission: RE | Admit: 2015-01-03 | Discharge: 2015-01-03 | Disposition: A | Discharge status: Home / Self Care | Payer: Medicare Other | Source: Ambulatory Visit | Attending: Nurse Practitioner | Admitting: Nurse Practitioner

## 2015-01-03 ENCOUNTER — Other Ambulatory Visit (HOSPITAL_COMMUNITY): Payer: Self-pay | Admitting: Nurse Practitioner

## 2015-01-03 DIAGNOSIS — Z36 Encounter for antenatal screening of mother: Secondary | ICD-10-CM | POA: Diagnosis not present

## 2015-01-03 DIAGNOSIS — O3663X0 Maternal care for excessive fetal growth, third trimester, not applicable or unspecified: Secondary | ICD-10-CM | POA: Insufficient documentation

## 2015-01-03 DIAGNOSIS — O403XX0 Polyhydramnios, third trimester, not applicable or unspecified: Secondary | ICD-10-CM | POA: Diagnosis not present

## 2015-01-03 DIAGNOSIS — O99333 Smoking (tobacco) complicating pregnancy, third trimester: Secondary | ICD-10-CM | POA: Diagnosis not present

## 2015-01-03 DIAGNOSIS — F17210 Nicotine dependence, cigarettes, uncomplicated: Secondary | ICD-10-CM | POA: Insufficient documentation

## 2015-01-03 DIAGNOSIS — O321XX2 Maternal care for breech presentation, fetus 2: Secondary | ICD-10-CM

## 2015-01-03 DIAGNOSIS — Z3A37 37 weeks gestation of pregnancy: Secondary | ICD-10-CM | POA: Insufficient documentation

## 2015-01-04 ENCOUNTER — Encounter (HOSPITAL_COMMUNITY): Payer: Self-pay | Admitting: *Deleted

## 2015-01-04 ENCOUNTER — Inpatient Hospital Stay (HOSPITAL_COMMUNITY)
Admission: AD | Admit: 2015-01-04 | Discharge: 2015-01-04 | Disposition: A | Discharge status: Home / Self Care | Payer: Medicare Other | Source: Ambulatory Visit | Attending: Obstetrics & Gynecology | Admitting: Obstetrics & Gynecology

## 2015-01-04 DIAGNOSIS — Z3A37 37 weeks gestation of pregnancy: Secondary | ICD-10-CM | POA: Diagnosis not present

## 2015-01-04 NOTE — [facility] Note (Signed)
Pt presents to [facility] with complaints of contractions that started three days ago but have gotten regular this evening. Denies any vaginal bleeding or LOF

## 2015-01-04 NOTE — Discharge Instructions (Signed)
KEEP all Appointments  Braxton Hicks Contractions Contractions of the uterus can occur throughout pregnancy. Contractions are not always a sign that you are in labor.  WHAT ARE BRAXTON HICKS CONTRACTIONS?  Contractions that occur before labor are called Braxton Hicks contractions, or false labor. Toward the end of pregnancy (32-34 weeks), these contractions can develop more often and may become more forceful. This is not true labor because these contractions do not result in opening (dilatation) and thinning of the cervix. They are sometimes difficult to tell apart from true labor because these contractions can be forceful and people have different pain tolerances. You should not feel embarrassed if you go to the hospital with false labor. Sometimes, the only way to tell if you are in true labor is for your health care provider to look for changes in the cervix. If there are no prenatal problems or other health problems associated with the pregnancy, it is completely safe to be sent home with false labor and await the onset of true labor. HOW CAN YOU TELL THE DIFFERENCE BETWEEN TRUE AND FALSE LABOR? False Labor  The contractions of false labor are usually shorter and not as hard as those of true labor.   The contractions are usually irregular.   The contractions are often felt in the front of the lower abdomen and in the groin.   The contractions may go away when you walk around or change positions while lying down.   The contractions get weaker and are shorter lasting as time goes on.   The contractions do not usually become progressively stronger, regular, and closer together as with true labor.  True Labor  Contractions in true labor last 30-70 seconds, become very regular, usually become more intense, and increase in frequency.   The contractions do not go away with walking.   The discomfort is usually felt in the top of the uterus and spreads to the lower abdomen and low back.    True labor can be determined by your health care provider with an exam. This will show that the cervix is dilating and getting thinner.  WHAT TO REMEMBER  Keep up with your usual exercises and follow other instructions given by your health care provider.   Take medicines as directed by your health care provider.   Keep your regular prenatal appointments.   Eat and drink lightly if you think you are going into labor.   If Braxton Hicks contractions are making you uncomfortable:   Change your position from lying down or resting to walking, or from walking to resting.   Sit and rest in a tub of warm water.   Drink 2-3 glasses of water. Dehydration may cause these contractions.   Do slow and deep breathing several times an hour.  WHEN SHOULD I SEEK IMMEDIATE MEDICAL CARE? Seek immediate medical care if:  Your contractions become stronger, more regular, and closer together.   You have fluid leaking or gushing from your vagina.   You have a fever.   You pass blood-tinged mucus.   You have vaginal bleeding.   You have continuous abdominal pain.   You have low back pain that you never had before.   You feel your baby's head pushing down and causing pelvic pressure.   Your baby is not moving as much as it used to.  Document Released: 10/07/2005 Document Revised: 10/12/2013 Document Reviewed: 07/19/2013 [facility] Patient Information 2015 [place] Woods[place], [place]. This information is not intended to replace advice given to you  by your health care provider. Make sure you discuss any questions you have with your health care provider. Fetal Movement Counts Patient Name: __________________________________________________ Patient Due Date: ____________________ Performing a fetal movement count is highly recommended in high-risk pregnancies, but it is good for every pregnant woman to do. Your health care provider may ask you to start counting fetal movements at 28 weeks of  the pregnancy. Fetal movements often increase:  After eating a full meal.  After physical activity.  After eating or drinking something sweet or cold.  At rest. Pay attention to when you feel the baby is most active. This will help you notice a pattern of your baby's sleep and wake cycles and what factors contribute to an increase in fetal movement. It is important to perform a fetal movement count at the same time each day when your baby is normally most active.  HOW TO COUNT FETAL MOVEMENTS  Find a quiet and comfortable area to sit or lie down on your left side. Lying on your left side provides the best blood and oxygen circulation to your baby.  Write down the day and time on a sheet of paper or in a journal.  Start counting kicks, flutters, swishes, rolls, or jabs in a 2-hour period. You should feel at least 10 movements within 2 hours.  If you do not feel 10 movements in 2 hours, wait 2-3 hours and count again. Look for a change in the pattern or not enough counts in 2 hours. SEEK MEDICAL CARE IF:  You feel less than 10 counts in 2 hours, tried twice.  There is no movement in over an hour.  The pattern is changing or taking longer each day to reach 10 counts in 2 hours.  You feel the baby is not moving as he or she usually does. Date: ____________ Movements: ____________ Start time: ____________ Doreatha Martin time: ____________  Date: ____________ Movements: ____________ Start time: ____________ Doreatha Martin time: ____________ Date: ____________ Movements: ____________ Start time: ____________ Doreatha Martin time: ____________ Date: ____________ Movements: ____________ Start time: ____________ Doreatha Martin time: ____________ Date: ____________ Movements: ____________ Start time: ____________ Doreatha Martin time: ____________ Date: ____________ Movements: ____________ Start time: ____________ Doreatha Martin time: ____________ Date: ____________ Movements: ____________ Start time: ____________ Doreatha Martin time:  ____________ Date: ____________ Movements: ____________ Start time: ____________ Doreatha Martin time: ____________  Date: ____________ Movements: ____________ Start time: ____________ Doreatha Martin time: ____________ Date: ____________ Movements: ____________ Start time: ____________ Doreatha Martin time: ____________ Date: ____________ Movements: ____________ Start time: ____________ Doreatha Martin time: ____________ Date: ____________ Movements: ____________ Start time: ____________ Doreatha Martin time: ____________ Date: ____________ Movements: ____________ Start time: ____________ Doreatha Martin time: ____________ Date: ____________ Movements: ____________ Start time: ____________ Doreatha Martin time: ____________ Date: ____________ Movements: ____________ Start time: ____________ Doreatha Martin time: ____________  Date: ____________ Movements: ____________ Start time: ____________ Doreatha Martin time: ____________ Date: ____________ Movements: ____________ Start time: ____________ Doreatha Martin time: ____________ Date: ____________ Movements: ____________ Start time: ____________ Doreatha Martin time: ____________ Date: ____________ Movements: ____________ Start time: ____________ Doreatha Martin time: ____________ Date: ____________ Movements: ____________ Start time: ____________ Doreatha Martin time: ____________ Date: ____________ Movements: ____________ Start time: ____________ Doreatha Martin time: ____________ Date: ____________ Movements: ____________ Start time: ____________ Doreatha Martin time: ____________  Date: ____________ Movements: ____________ Start time: ____________ Doreatha Martin time: ____________ Date: ____________ Movements: ____________ Start time: ____________ Doreatha Martin time: ____________ Date: ____________ Movements: ____________ Start time: ____________ Doreatha Martin time: ____________ Date: ____________ Movements: ____________ Start time: ____________ Doreatha Martin time: ____________ Date: ____________ Movements: ____________ Start time: ____________ Doreatha Martin time: ____________ Date: ____________ Movements:  ____________ Start time: ____________ Doreatha Martin time: ____________  Date: ____________ Movements: ____________ Start time: ____________ Doreatha MartinFinish time: ____________  Date: ____________ Movements: ____________ Start time: ____________ Doreatha MartinFinish time: ____________ Date: ____________ Movements: ____________ Start time: ____________ Doreatha MartinFinish time: ____________ Date: ____________ Movements: ____________ Start time: ____________ Doreatha MartinFinish time: ____________ Date: ____________ Movements: ____________ Start time: ____________ Doreatha MartinFinish time: ____________ Date: ____________ Movements: ____________ Start time: ____________ Doreatha MartinFinish time: ____________ Date: ____________ Movements: ____________ Start time: ____________ Doreatha MartinFinish time: ____________ Date: ____________ Movements: ____________ Start time: ____________ Doreatha MartinFinish time: ____________  Date: ____________ Movements: ____________ Start time: ____________ Doreatha MartinFinish time: ____________ Date: ____________ Movements: ____________ Start time: ____________ Doreatha MartinFinish time: ____________ Date: ____________ Movements: ____________ Start time: ____________ Doreatha MartinFinish time: ____________ Date: ____________ Movements: ____________ Start time: ____________ Doreatha MartinFinish time: ____________ Date: ____________ Movements: ____________ Start time: ____________ Doreatha MartinFinish time: ____________ Date: ____________ Movements: ____________ Start time: ____________ Doreatha MartinFinish time: ____________ Date: ____________ Movements: ____________ Start time: ____________ Doreatha MartinFinish time: ____________  Date: ____________ Movements: ____________ Start time: ____________ Doreatha MartinFinish time: ____________ Date: ____________ Movements: ____________ Start time: ____________ Doreatha MartinFinish time: ____________ Date: ____________ Movements: ____________ Start time: ____________ Doreatha MartinFinish time: ____________ Date: ____________ Movements: ____________ Start time: ____________ Doreatha MartinFinish time: ____________ Date: ____________ Movements: ____________ Start time: ____________ Doreatha MartinFinish  time: ____________ Date: ____________ Movements: ____________ Start time: ____________ Doreatha MartinFinish time: ____________ Date: ____________ Movements: ____________ Start time: ____________ Doreatha MartinFinish time: ____________  Date: ____________ Movements: ____________ Start time: ____________ Doreatha MartinFinish time: ____________ Date: ____________ Movements: ____________ Start time: ____________ Doreatha MartinFinish time: ____________ Date: ____________ Movements: ____________ Start time: ____________ Doreatha MartinFinish time: ____________ Date: ____________ Movements: ____________ Start time: ____________ Doreatha MartinFinish time: ____________ Date: ____________ Movements: ____________ Start time: ____________ Doreatha MartinFinish time: ____________ Date: ____________ Movements: ____________ Start time: ____________ Doreatha MartinFinish time: ____________ Document Released: 11/06/2006 Document Revised: 02/21/2014 Document Reviewed: 08/03/2012 Exit[employer] Patient Information 2015 [place], [place]. This information is not intended to replace advice given to you by your health care provider. Make sure you discuss any questions you have with your health care provider.

## 2015-01-05 ENCOUNTER — Other Ambulatory Visit (HOSPITAL_COMMUNITY): Payer: Self-pay | Admitting: Nurse Practitioner

## 2015-01-05 DIAGNOSIS — O403XX0 Polyhydramnios, third trimester, not applicable or unspecified: Secondary | ICD-10-CM

## 2015-01-06 ENCOUNTER — Other Ambulatory Visit (HOSPITAL_COMMUNITY): Payer: Self-pay | Admitting: Nurse Practitioner

## 2015-01-06 DIAGNOSIS — Z3483 Encounter for supervision of other normal pregnancy, third trimester: Secondary | ICD-10-CM | POA: Diagnosis not present

## 2015-01-06 DIAGNOSIS — O403XX0 Polyhydramnios, third trimester, not applicable or unspecified: Secondary | ICD-10-CM

## 2015-01-06 DIAGNOSIS — O403XX1 Polyhydramnios, third trimester, fetus 1: Secondary | ICD-10-CM | POA: Diagnosis not present

## 2015-01-06 DIAGNOSIS — Z3482 Encounter for supervision of other normal pregnancy, second trimester: Secondary | ICD-10-CM | POA: Diagnosis not present

## 2015-01-06 DIAGNOSIS — Z23 Encounter for immunization: Secondary | ICD-10-CM | POA: Diagnosis not present

## 2015-01-08 ENCOUNTER — Encounter (HOSPITAL_COMMUNITY): Payer: Self-pay

## 2015-01-08 ENCOUNTER — Inpatient Hospital Stay (HOSPITAL_COMMUNITY)
Admission: AD | Admit: 2015-01-08 | Discharge: 2015-01-08 | Disposition: A | Discharge status: Home / Self Care | Payer: Medicare Other | Source: Ambulatory Visit | Attending: Obstetrics and Gynecology | Admitting: Obstetrics and Gynecology

## 2015-01-08 DIAGNOSIS — K5900 Constipation, unspecified: Secondary | ICD-10-CM | POA: Diagnosis not present

## 2015-01-08 DIAGNOSIS — O99213 Obesity complicating pregnancy, third trimester: Secondary | ICD-10-CM | POA: Insufficient documentation

## 2015-01-08 DIAGNOSIS — O9989 Other specified diseases and conditions complicating pregnancy, childbirth and the puerperium: Secondary | ICD-10-CM | POA: Diagnosis not present

## 2015-01-08 DIAGNOSIS — O99613 Diseases of the digestive system complicating pregnancy, third trimester: Secondary | ICD-10-CM

## 2015-01-08 DIAGNOSIS — F17210 Nicotine dependence, cigarettes, uncomplicated: Secondary | ICD-10-CM | POA: Insufficient documentation

## 2015-01-08 DIAGNOSIS — O403XX0 Polyhydramnios, third trimester, not applicable or unspecified: Secondary | ICD-10-CM | POA: Insufficient documentation

## 2015-01-08 DIAGNOSIS — Z3A37 37 weeks gestation of pregnancy: Secondary | ICD-10-CM | POA: Insufficient documentation

## 2015-01-08 DIAGNOSIS — O99333 Smoking (tobacco) complicating pregnancy, third trimester: Secondary | ICD-10-CM | POA: Diagnosis not present

## 2015-01-08 MED ORDER — FLEET ENEMA 7-19 GM/118ML RE ENEM
1.0000 | ENEMA | Freq: Once | RECTAL | Status: AC
  Administered 2015-01-08: 1 via RECTAL

## 2015-01-08 NOTE — [facility] Note (Signed)
Pt presents to [facility] with complaints of constipation. Reports she has not had a bowel movement in a week.

## 2015-01-08 NOTE — Discharge Instructions (Signed)

## 2015-01-08 NOTE — MAU Provider Note (Signed)
History  Chief Complaint:  Constipation  Stacy Blackwell is a 34 y.o. G46P1001 female at [redacted]w[redacted]d presenting with complaint of constipation.  Has not had a bowel movement x 1 week.   Reports active fetal movement, contractions: none, vaginal bleeding: none, membranes: intact. Denies uti s/s, abnormal/malodorous vag d/c or vulvovaginal itching/irritation.   P[facility] care at [facility].  Next visit 01/10/2015. Pregnancy complicated by obesity, tobacco use, and polyhydramnios in third trimester.  Obstetrical History: OB History    Gravida Para Term Preterm AB TAB SAB Ectopic Multiple Living   0 0 0 0 0 0 1      Past Medical History: Past Medical History  Diagnosis Date  . Asthma   . Vertigo   . Acid reflux   . Obesity   . Depression     doing good  . Ovarian cyst     Past Surgical History: Past Surgical History  Procedure Laterality Date  . Cyst excision Left 1987    Social History: History   Social History  . Marital Status: Single    Spouse Name: N/A  . Number of Children: N/A  . Years of Education: N/A   Occupational History  . unemployed    Social History Main Topics  . Smoking status: Current Every Day Smoker -- 0.50 packs/day for 10 years    Types: Cigarettes  . Smokeless tobacco: Never Used  . Alcohol Use: No  . Drug Use: No  . Sexual Activity: Yes    Birth Control/ Protection: None   Other Topics Concern  . None   Social History Narrative    Allergies: Allergies  Allergen Reactions  . Amoxicillin Hives and Swelling    Swelling in the eyes    Prescriptions prior to admission  Medication Sig Dispense Refill Last Dose  . acetaminophen (TYLENOL) 325 MG tablet Take 650 mg by mouth every 6 (six) hours as needed for headache.   Past Month at Unknown time  . albuterol (PROVENTIL HFA;VENTOLIN HFA) 108 (90 BASE) MCG/ACT inhaler Inhale 2 puffs into the lungs every 6 (six) hours as needed for wheezing or shortness of breath. 1 Inhaler 0 01/08/2015 at  Unknown time  . albuterol (PROVENTIL) (5 MG/ML) 0.5% nebulizer solution Take 2.5 mg by nebulization every 6 (six) hours as needed for wheezing or shortness of breath.   01/07/2015 at Unknown time  . budesonide-formoterol (SYMBICORT) 160-4.5 MCG/ACT inhaler Inhale 2 puffs into the lungs 2 (two) times daily. 1 Inhaler 6 01/08/2015 at Unknown time  . cetirizine (ZYRTEC) 10 MG tablet Take 1 tablet (10 mg total) by mouth daily. 30 tablet 5 01/08/2015 at Unknown time  . famotidine (PEPCID) 20 MG tablet Take 1 tablet (20 mg total) by mouth 2 (two) times daily. 30 tablet 0 01/08/2015 at Unknown time  . fluticasone (FLONASE) 50 MCG/ACT nasal spray Place 2 sprays into both nostrils daily. 16 g 2 01/08/2015 at Unknown time  . prenatal vitamin w/FE, FA (NATACHEW) 29-1 MG CHEW chewable tablet Chew 1 tablet by mouth daily at 12 noon.   01/08/2015 at Unknown time  . ranitidine (ZANTAC) 150 MG tablet Take 150 mg by mouth 2 (two) times daily.   01/07/2015 at Unknown time  . HYDROcodone-acetaminophen (NORCO/VICODIN) 5-325 MG per tablet Take 1 tablet by mouth every 6 (six) hours as needed for moderate pain. (Patient not taking: Reported on 01/04/2015) 15 tablet 0 more than one month  . predniSONE (DELTASONE) 20 MG tablet Take 2 tablets (40 mg total)  by mouth daily. (Patient not taking: Reported on 01/08/2015) 10 tablet 0 Past Week at Unknown time    Review of Systems  Pertinent pos/neg as indicated in HPI  Physical Exam  Blood pressure 115/75, pulse 103, temperature 97.8 F (36.6 C), temperature source Oral, resp. rate 18, last menstrual period 04/19/2014. General appearance: alert, cooperative and no distress Lungs:  normal effort Heart: regular rate and rhythm Abdomen: gravid, soft, non-tender Extremities: trace edema  Spec exam: deferred Cultures/Specimens: none    Presentation: unsure  Fetal monitoring: FHR: 145 bpm, variability: moderate,  Accelerations: Present,  decelerations:  Absent Uterine activity: no  contractions  MAU Course  Reactive NST, no decels or contractions noted.  Fleets enema with good results.  Labs:  No results found for this or any previous visit (from the past 24 hour(s)).  Imaging:  n/a  Assessment and Plan  A:  6670w5d SIUP  G2P1001  Constipation  Cat 1 FHR P:  Discharge home  May use Miralax prn  Reviewed fetal movement, s/s labor & when to call, adequated hydration & high fiber diet.  [facility] as scheduled   Felton ClintonRoss,Velton Roselle Wynne SNM 3/20/201612:58 PM

## 2015-01-10 ENCOUNTER — Ambulatory Visit (HOSPITAL_COMMUNITY)
Admission: RE | Admit: 2015-01-10 | Discharge: 2015-01-10 | Disposition: A | Discharge status: Home / Self Care | Payer: Medicare Other | Source: Ambulatory Visit | Attending: Nurse Practitioner | Admitting: Nurse Practitioner

## 2015-01-10 DIAGNOSIS — Z3483 Encounter for supervision of other normal pregnancy, third trimester: Secondary | ICD-10-CM | POA: Diagnosis not present

## 2015-01-10 DIAGNOSIS — Z3A38 38 weeks gestation of pregnancy: Secondary | ICD-10-CM | POA: Insufficient documentation

## 2015-01-10 DIAGNOSIS — O403XX0 Polyhydramnios, third trimester, not applicable or unspecified: Secondary | ICD-10-CM | POA: Diagnosis not present

## 2015-01-12 ENCOUNTER — Encounter (HOSPITAL_COMMUNITY): Payer: Self-pay | Admitting: *Deleted

## 2015-01-12 ENCOUNTER — Telehealth (HOSPITAL_COMMUNITY): Payer: Self-pay | Admitting: *Deleted

## 2015-01-12 DIAGNOSIS — O403XX1 Polyhydramnios, third trimester, fetus 1: Secondary | ICD-10-CM | POA: Diagnosis not present

## 2015-01-12 DIAGNOSIS — Z3490 Encounter for supervision of normal pregnancy, unspecified, unspecified trimester: Secondary | ICD-10-CM | POA: Diagnosis not present

## 2015-01-12 NOTE — Telephone Encounter (Signed)
Preadmission screen  

## 2015-01-13 ENCOUNTER — Encounter (HOSPITAL_COMMUNITY): Payer: Self-pay

## 2015-01-13 ENCOUNTER — Inpatient Hospital Stay (HOSPITAL_COMMUNITY)
Admission: AD | Admit: 2015-01-13 | Discharge: 2015-01-15 | DRG: 775 | Disposition: A | Discharge status: Home / Self Care | Payer: Medicare Other | Source: Ambulatory Visit | Attending: Obstetrics & Gynecology | Admitting: Obstetrics & Gynecology

## 2015-01-13 DIAGNOSIS — Z3A38 38 weeks gestation of pregnancy: Secondary | ICD-10-CM | POA: Diagnosis present

## 2015-01-13 DIAGNOSIS — O4290 Premature rupture of membranes, unspecified as to length of time between rupture and onset of labor, unspecified weeks of gestation: Secondary | ICD-10-CM | POA: Diagnosis present

## 2015-01-13 DIAGNOSIS — O403XX0 Polyhydramnios, third trimester, not applicable or unspecified: Secondary | ICD-10-CM | POA: Diagnosis not present

## 2015-01-13 DIAGNOSIS — Z3483 Encounter for supervision of other normal pregnancy, third trimester: Secondary | ICD-10-CM | POA: Diagnosis present

## 2015-01-13 DIAGNOSIS — IMO0001 Reserved for inherently not codable concepts without codable children: Secondary | ICD-10-CM

## 2015-01-13 LAB — CBC
HEMATOCRIT: 32.2 % — AB (ref 36.0–46.0)
HEMOGLOBIN: 10.8 g/dL — AB (ref 12.0–15.0)
MCH: 28.6 pg (ref 26.0–34.0)
MCHC: 33.5 g/dL (ref 30.0–36.0)
MCV: 85.4 fL (ref 78.0–100.0)
Platelets: 222 10*3/uL (ref 150–400)
RBC: 3.77 MIL/uL — AB (ref 3.87–5.11)
RDW: 13.5 % (ref 11.5–15.5)
WBC: 8 10*3/uL (ref 4.0–10.5)

## 2015-01-13 LAB — TYPE AND SCREEN
ABO/RH(D): A POS
ANTIBODY SCREEN: NEGATIVE

## 2015-01-13 MED ORDER — ACETAMINOPHEN 325 MG PO TABS: 650.0000 mg | ORAL_TABLET | ORAL | Status: DC | PRN

## 2015-01-13 MED ORDER — OXYTOCIN 40 UNITS IN LACTATED RINGERS INFUSION - SIMPLE MED
62.5000 mL/h | INTRAVENOUS | Status: DC
Start: 2015-01-13 — End: 2015-01-14

## 2015-01-13 MED ORDER — ONDANSETRON HCL 4 MG/2ML IJ SOLN
4.0000 mg | Freq: Four times a day (QID) | INTRAMUSCULAR | Status: DC | PRN
Start: 2015-01-13 — End: 2015-01-13

## 2015-01-13 MED ORDER — CITRIC ACID-SODIUM CITRATE 334-500 MG/5ML PO SOLN: 30.0000 mL | ORAL | Status: DC | PRN

## 2015-01-13 MED ORDER — TERBUTALINE SULFATE 1 MG/ML IJ SOLN: 0.2500 mg | Freq: Once | INTRAMUSCULAR | Status: AC | PRN

## 2015-01-13 MED ORDER — ALBUTEROL SULFATE (2.5 MG/3ML) 0.083% IN NEBU
3.0000 mL | INHALATION_SOLUTION | Freq: Four times a day (QID) | RESPIRATORY_TRACT | Status: DC | PRN
  Administered 2015-01-13: 3 mL via RESPIRATORY_TRACT
  Filled 2015-01-13: qty 3

## 2015-01-13 MED ORDER — LIDOCAINE HCL (PF) 1 % IJ SOLN: 30.0000 mL | INTRAMUSCULAR | Status: DC | PRN

## 2015-01-13 MED ORDER — LACTATED RINGERS IV SOLN: INTRAVENOUS | Status: DC

## 2015-01-13 MED ORDER — OXYTOCIN BOLUS FROM INFUSION: 500.0000 mL | INTRAVENOUS | Status: DC

## 2015-01-13 MED ORDER — ONDANSETRON HCL 4 MG/2ML IJ SOLN: 4.0000 mg | Freq: Four times a day (QID) | INTRAMUSCULAR | Status: DC | PRN

## 2015-01-13 MED ORDER — OXYTOCIN BOLUS FROM INFUSION
500.0000 mL | INTRAVENOUS | Status: DC
Start: 2015-01-13 — End: 2015-01-14
  Administered 2015-01-14: 500 mL via INTRAVENOUS

## 2015-01-13 MED ORDER — LACTATED RINGERS IV SOLN
INTRAVENOUS | Status: DC
  Administered 2015-01-13 – 2015-01-14 (×4): via INTRAVENOUS

## 2015-01-13 MED ORDER — OXYCODONE-ACETAMINOPHEN 5-325 MG PO TABS: 1.0000 | ORAL_TABLET | ORAL | Status: DC | PRN

## 2015-01-13 MED ORDER — OXYCODONE-ACETAMINOPHEN 5-325 MG PO TABS: 2.0000 | ORAL_TABLET | ORAL | Status: DC | PRN

## 2015-01-13 MED ORDER — LIDOCAINE HCL (PF) 1 % IJ SOLN
30.0000 mL | INTRAMUSCULAR | Status: DC | PRN
  Filled 2015-01-13: qty 30

## 2015-01-13 MED ORDER — FAMOTIDINE 20 MG PO TABS
20.0000 mg | ORAL_TABLET | Freq: Two times a day (BID) | ORAL | Status: DC
  Administered 2015-01-13 – 2015-01-14 (×2): 20 mg via ORAL
  Filled 2015-01-13 (×2): qty 1

## 2015-01-13 MED ORDER — OXYTOCIN 40 UNITS IN LACTATED RINGERS INFUSION - SIMPLE MED: 62.5000 mL/h | INTRAVENOUS | Status: DC

## 2015-01-13 MED ORDER — LACTATED RINGERS IV SOLN
500.0000 mL | INTRAVENOUS | Status: DC | PRN
  Administered 2015-01-14: 300 mL via INTRAVENOUS

## 2015-01-13 MED ORDER — BUDESONIDE-FORMOTEROL FUMARATE 160-4.5 MCG/ACT IN AERO
2.0000 | INHALATION_SPRAY | Freq: Two times a day (BID) | RESPIRATORY_TRACT | Status: DC
  Administered 2015-01-13 – 2015-01-14 (×3): 2 via RESPIRATORY_TRACT
  Filled 2015-01-13: qty 6

## 2015-01-13 MED ORDER — MISOPROSTOL 50MCG HALF TABLET
50.0000 ug | ORAL_TABLET | ORAL | Status: DC | PRN
  Administered 2015-01-13 – 2015-01-14 (×3): 50 ug via ORAL
  Filled 2015-01-13 (×5): qty 1

## 2015-01-13 MED ORDER — BUTORPHANOL TARTRATE 1 MG/ML IJ SOLN
1.0000 mg | INTRAMUSCULAR | Status: DC | PRN
  Administered 2015-01-14: 1 mg via INTRAVENOUS
  Filled 2015-01-13: qty 1

## 2015-01-13 MED ORDER — LACTATED RINGERS IV SOLN: 500.0000 mL | INTRAVENOUS | Status: DC | PRN

## 2015-01-13 NOTE — H&P (Addendum)
Stacy BeechamStephanie R Blackwell is a 34 y.o. female presenting for SROM. Maternal Medical History:  Reason for admission: Rupture of membranes.  Nausea.  Contractions: Onset was less than 1 hour ago.   Frequency: irregular.   Duration is approximately 45 seconds.   Perceived severity is moderate.   15 minutes ago  Fetal activity: Perceived fetal activity is normal.   Last perceived fetal movement was within the past hour.    Prenatal complications: Polyhydramnios.   No bleeding, IUGR, pre-eclampsia, preterm labor, substance abuse, thrombocytopenia or thrombophilia.   Prenatal Complications - Diabetes: none.    OB History    Gravida Para Term Preterm AB TAB SAB Ectopic Multiple Living   2 1 1  0 0 0 0 0 0 1     Past Medical History  Diagnosis Date  . Asthma   . Vertigo   . Acid reflux   . Obesity   . Ovarian cyst   . Depression     doing good, no meds   Past Surgical History  Procedure Laterality Date  . Cyst excision Left 1987   Family History: family history includes Asthma in her mother and sister; Diabetes in her mother and sister; Heart disease in her mother and sister. There is no history of Hearing loss. Social History:  reports that she has been smoking Cigarettes.  She has a 2.5 pack-year smoking history. She has never used smokeless tobacco. She reports that she does not drink alcohol or use illicit drugs.   Prenatal Transfer Tool  Maternal Diabetes: No Genetic Screening: Declined Maternal Ultrasounds/Referrals: Normal Fetal Ultrasounds or other Referrals:  None Maternal Substance Abuse:  No Significant Maternal Medications:  Meds include: Other: Symbicort, Pepcid Significant Maternal Lab Results:  None Other Comments:  Polyhydramnios  Review of Systems  Constitutional: Negative for fever and chills.  Respiratory: Negative for cough, sputum production, shortness of breath and wheezing.   Cardiovascular: Negative for chest pain and palpitations.  Gastrointestinal:  Negative for heartburn, nausea, vomiting, abdominal pain, diarrhea and constipation.    Dilation: Fingertip Effacement (%): 80 Station: Ballotable Exam by:: Avery DennisonBenji Stanley RN Last menstrual period 04/19/2014. Maternal Exam:  Uterine Assessment: Contraction frequency is irregular.   Abdomen: Patient reports no abdominal tenderness. Fundal height is Term.   Fetal presentation: vertex  Introitus: Amniotic fluid character: clear.     Fetal Exam Fetal Monitor Review: Mode: hand-held doppler probe.   Baseline rate: 130.  Variability: moderate (6-25 bpm).   Pattern: no accelerations and no decelerations.    Fetal State Assessment: Category I - tracings are normal.     Physical Exam  Constitutional: She is oriented to person, place, and time. She appears well-developed and well-nourished.  Respiratory: Effort normal.  GI: Soft. Bowel sounds are normal. She exhibits no distension and no mass. There is no tenderness. There is no rebound and no guarding.  Neurological: She is alert and oriented to person, place, and time.  Skin: Skin is warm and dry.  Psychiatric: She has a normal mood and affect. Her behavior is normal. Judgment and thought content normal.    Prenatal labs: ABO, Rh: A/Positive/-- (08/27 0000) Antibody: Negative (08/27 0000) Rubella: Nonimmune (08/27 0000) RPR: Nonreactive (08/27 0000)  HBsAg: Negative (08/27 0000)  HIV: Non-reactive (08/27 0000)  GBS:     Assessment/Plan: 1.  SROM  2.  IUP at 5465w3d 3.  G2P1001 4.  Chronic Opioid replacement 5.  GBS Neg 6.  Polyhydramnios  [facility] If remains unchanged, start  pitocin Expect vaginal delivery Breast feed Nexplanon for birth control [facility] for pediatric provider.  Levie Heritage, DO 01/13/2015, 7:18 PM

## 2015-01-13 NOTE — MAU Note (Signed)
Rayfield CitizenCaroline RN brought pt in room by wheelchair, pt yelling the baby is coming.  Reports water broke at 6:40 pm.  Clear fluid, having contractions and pressure.

## 2015-01-14 ENCOUNTER — Inpatient Hospital Stay (HOSPITAL_COMMUNITY): Payer: Medicare Other | Admitting: Anesthesiology

## 2015-01-14 ENCOUNTER — Encounter (HOSPITAL_COMMUNITY): Payer: Self-pay

## 2015-01-14 DIAGNOSIS — O403XX0 Polyhydramnios, third trimester, not applicable or unspecified: Secondary | ICD-10-CM

## 2015-01-14 DIAGNOSIS — Z3A38 38 weeks gestation of pregnancy: Secondary | ICD-10-CM

## 2015-01-14 LAB — RPR: RPR: NONREACTIVE

## 2015-01-14 LAB — CBC
HCT: 32.3 % — ABNORMAL LOW (ref 36.0–46.0)
HEMOGLOBIN: 10.7 g/dL — AB (ref 12.0–15.0)
MCH: 28.7 pg (ref 26.0–34.0)
MCHC: 33.1 g/dL (ref 30.0–36.0)
MCV: 86.6 fL (ref 78.0–100.0)
PLATELETS: 203 10*3/uL (ref 150–400)
RBC: 3.73 MIL/uL — AB (ref 3.87–5.11)
RDW: 13.7 % (ref 11.5–15.5)
WBC: 8.2 10*3/uL (ref 4.0–10.5)

## 2015-01-14 LAB — HIV ANTIBODY (ROUTINE TESTING W REFLEX): HIV SCREEN 4TH GENERATION: NONREACTIVE

## 2015-01-14 MED ORDER — LACTATED RINGERS IV SOLN
500.0000 mL | Freq: Once | INTRAVENOUS | Status: AC
  Administered 2015-01-14: 500 mL via INTRAVENOUS

## 2015-01-14 MED ORDER — DIPHENHYDRAMINE HCL 25 MG PO CAPS: 25.0000 mg | ORAL_CAPSULE | Freq: Four times a day (QID) | ORAL | Status: DC | PRN

## 2015-01-14 MED ORDER — EPHEDRINE 5 MG/ML INJ: 10.0000 mg | INTRAVENOUS | Status: DC | PRN

## 2015-01-14 MED ORDER — LACTATED RINGERS IV BOLUS (SEPSIS)
1000.0000 mL | Freq: Once | INTRAVENOUS | Status: AC
  Administered 2015-01-14: 1000 mL via INTRAVENOUS

## 2015-01-14 MED ORDER — ZOLPIDEM TARTRATE 5 MG PO TABS: 5.0000 mg | ORAL_TABLET | Freq: Every evening | ORAL | Status: DC | PRN

## 2015-01-14 MED ORDER — LANOLIN HYDROUS EX OINT: TOPICAL_OINTMENT | CUTANEOUS | Status: DC | PRN

## 2015-01-14 MED ORDER — WITCH HAZEL-GLYCERIN EX PADS: 1.0000 "application " | MEDICATED_PAD | CUTANEOUS | Status: DC | PRN

## 2015-01-14 MED ORDER — BENZOCAINE-MENTHOL 20-0.5 % EX AERO
1.0000 "application " | INHALATION_SPRAY | CUTANEOUS | Status: DC | PRN
  Filled 2015-01-14: qty 56

## 2015-01-14 MED ORDER — DIPHENHYDRAMINE HCL 50 MG/ML IJ SOLN
12.5000 mg | INTRAMUSCULAR | Status: DC | PRN
Start: 2015-01-14 — End: 2015-01-14
  Administered 2015-01-14: 12.5 mg via INTRAVENOUS
  Filled 2015-01-14: qty 1

## 2015-01-14 MED ORDER — FENTANYL CITRATE 0.05 MG/ML IJ SOLN
100.0000 ug | INTRAMUSCULAR | Status: DC | PRN
  Administered 2015-01-14: 100 ug via INTRAVENOUS
  Filled 2015-01-14: qty 2

## 2015-01-14 MED ORDER — SENNOSIDES-DOCUSATE SODIUM 8.6-50 MG PO TABS
2.0000 | ORAL_TABLET | ORAL | Status: DC
  Filled 2015-01-14: qty 2

## 2015-01-14 MED ORDER — DIBUCAINE 1 % RE OINT
1.0000 "application " | TOPICAL_OINTMENT | RECTAL | Status: DC | PRN
  Filled 2015-01-14: qty 28

## 2015-01-14 MED ORDER — OXYTOCIN 40 UNITS IN LACTATED RINGERS INFUSION - SIMPLE MED
1.0000 m[IU]/min | INTRAVENOUS | Status: DC
Start: 2015-01-14 — End: 2015-01-14
  Administered 2015-01-14: 2 m[IU]/min via INTRAVENOUS
  Filled 2015-01-14: qty 1000

## 2015-01-14 MED ORDER — IBUPROFEN 600 MG PO TABS
600.0000 mg | ORAL_TABLET | Freq: Four times a day (QID) | ORAL | Status: DC
  Filled 2015-01-14 (×3): qty 1

## 2015-01-14 MED ORDER — OXYCODONE-ACETAMINOPHEN 5-325 MG PO TABS
1.0000 | ORAL_TABLET | ORAL | Status: DC | PRN
Start: 2015-01-14 — End: 2015-01-15
  Administered 2015-01-14 – 2015-01-15 (×2): 1 via ORAL
  Filled 2015-01-14 (×3): qty 1

## 2015-01-14 MED ORDER — OXYCODONE-ACETAMINOPHEN 5-325 MG PO TABS
2.0000 | ORAL_TABLET | ORAL | Status: DC | PRN
  Administered 2015-01-15 (×2): 2 via ORAL
  Filled 2015-01-14 (×2): qty 2

## 2015-01-14 MED ORDER — PHENYLEPHRINE 40 MCG/ML (10ML) SYRINGE FOR IV PUSH (FOR BLOOD PRESSURE SUPPORT)
80.0000 ug | PREFILLED_SYRINGE | INTRAVENOUS | Status: DC | PRN
  Filled 2015-01-14: qty 20

## 2015-01-14 MED ORDER — ACETAMINOPHEN 325 MG PO TABS
650.0000 mg | ORAL_TABLET | ORAL | Status: DC | PRN
  Administered 2015-01-14: 650 mg via ORAL
  Filled 2015-01-14: qty 2

## 2015-01-14 MED ORDER — TETANUS-DIPHTH-ACELL PERTUSSIS 5-2.5-18.5 LF-MCG/0.5 IM SUSP
0.5000 mL | Freq: Once | INTRAMUSCULAR | Status: DC
  Filled 2015-01-14: qty 0.5

## 2015-01-14 MED ORDER — ONDANSETRON HCL 4 MG/2ML IJ SOLN: 4.0000 mg | INTRAMUSCULAR | Status: DC | PRN

## 2015-01-14 MED ORDER — PHENYLEPHRINE 40 MCG/ML (10ML) SYRINGE FOR IV PUSH (FOR BLOOD PRESSURE SUPPORT): 80.0000 ug | PREFILLED_SYRINGE | INTRAVENOUS | Status: DC | PRN

## 2015-01-14 MED ORDER — PRENATAL MULTIVITAMIN CH
1.0000 | ORAL_TABLET | Freq: Every day | ORAL | Status: DC
  Administered 2015-01-15: 1 via ORAL
  Filled 2015-01-14: qty 1

## 2015-01-14 MED ORDER — TERBUTALINE SULFATE 1 MG/ML IJ SOLN: 0.2500 mg | Freq: Once | INTRAMUSCULAR | Status: DC | PRN

## 2015-01-14 MED ORDER — ONDANSETRON HCL 4 MG PO TABS: 4.0000 mg | ORAL_TABLET | ORAL | Status: DC | PRN

## 2015-01-14 MED ORDER — FENTANYL 2.5 MCG/ML BUPIVACAINE 1/10 % EPIDURAL INFUSION (WH - ANES)
INTRAMUSCULAR | Status: DC | PRN
  Administered 2015-01-14: 14 mL/h via EPIDURAL

## 2015-01-14 MED ORDER — SIMETHICONE 80 MG PO CHEW: 80.0000 mg | CHEWABLE_TABLET | ORAL | Status: DC | PRN

## 2015-01-14 MED ORDER — LIDOCAINE HCL (PF) 1 % IJ SOLN
INTRAMUSCULAR | Status: DC | PRN
  Administered 2015-01-14: 3 mL
  Administered 2015-01-14 (×2): 5 mL

## 2015-01-14 MED ORDER — FENTANYL 2.5 MCG/ML BUPIVACAINE 1/10 % EPIDURAL INFUSION (WH - ANES)
14.0000 mL/h | INTRAMUSCULAR | Status: DC | PRN
  Administered 2015-01-14 (×2): 14 mL/h via EPIDURAL
  Filled 2015-01-14 (×2): qty 125

## 2015-01-14 NOTE — Anesthesia Procedure Notes (Signed)
Epidural Patient location during procedure: OB  Staffing Anesthesiologist: Payslee Bateson Performed by: anesthesiologist   Preanesthetic Checklist Completed: patient identified, site marked, surgical consent, pre-op evaluation, timeout performed, IV checked, risks and benefits discussed and monitors and equipment checked  Epidural Patient position: sitting Prep: ChloraPrep Patient monitoring: heart rate, continuous pulse ox and blood pressure Approach: right paramedian Location: L3-L4 Injection technique: LOR saline  Needle:  Needle type: Tuohy  Needle gauge: 17 G Needle length: 9 cm and 9 Needle insertion depth: 8 cm Catheter type: closed end flexible Catheter size: 20 Guage Catheter at skin depth: 13 cm Test dose: negative  Assessment Events: blood not aspirated, injection not painful, no injection resistance, negative IV test and no paresthesia  Additional Notes   Patient tolerated the insertion well without complications.   

## 2015-01-14 NOTE — Progress Notes (Signed)
Leftwich-Kirby, CNM, in department. Notified that patient started to feel dizzy when getting up [facility] Patient back to bed and vital signs wnl. Orders received to give 1000 ml IV fluid bolus, stat CBC and for patient to eat.

## 2015-01-14 NOTE — Progress Notes (Addendum)
Stacy BeechamStephanie R Blackwell is a 34 y.o. G2P1001 at 5456w4d by admitted for PROM.    Subjective: Pt comfortable with epidural.   Objective: BP 122/64 mmHg  Pulse 80  Temp(Src) 98.3 F (36.8 C) (Oral)  Resp 20  Ht 5\' 2"  (1.575 m)  Wt 122.018 kg (269 lb)  BMI 49.19 kg/m2  SpO2 100%  LMP 04/19/2014      FHT:  FHR: 130 bpm, variability: moderate,  accelerations:  Present,  decelerations:  Present early, variables, resolved with position/fluids UC:   regular, every 5-6 minutes SVE:   Dilation: 3 Effacement (%): 90 Station: -3 Exam by:: Leftwich-Kirby  Labs: Lab Results  Component Value Date   WBC 8.0 01/13/2015   HGB 10.8* 01/13/2015   HCT 32.2* 01/13/2015   MCV 85.4 01/13/2015   PLT 222 01/13/2015    Assessment / Plan: Augmentation of labor, progressing well   Labor: Progressing normally, Start Pitocin at 2 milliunits/min, increase by 2 per protocol Preeclampsia:  n/a Fetal Wellbeing:  Overall Category I Pain Control:  Epidural I/D:  n/a Anticipated MOD:  NSVD  LEFTWICH-KIRBY, LISA 01/14/2015, 1:15 PM

## 2015-01-14 NOTE — Anesthesia Preprocedure Evaluation (Signed)
Anesthesia Evaluation  Patient identified by MRN, date of birth, ID band Patient awake    Reviewed: Allergy & Precautions, H&P , NPO status , Patient's Chart, lab work & pertinent test results  History of Anesthesia Complications Negative for: history of anesthetic complications  Airway Mallampati: II  TM Distance: >3 FB Neck ROM: full    Dental no notable dental hx. (+) Teeth Intact   Pulmonary neg pulmonary ROS, asthma , Current Smoker,  breath sounds clear to auscultation  Pulmonary exam normal       Cardiovascular negative cardio ROS  Rhythm:regular Rate:Normal     Neuro/Psych negative neurological ROS  negative psych ROS   GI/Hepatic negative GI ROS, Neg liver ROS,   Endo/Other  negative endocrine ROSMorbid obesity  Renal/GU negative Renal ROS  negative genitourinary   Musculoskeletal   Abdominal Normal abdominal exam  (+)   Peds  Hematology negative hematology ROS (+)   Anesthesia Other Findings   Reproductive/Obstetrics (+) Pregnancy                             Anesthesia Physical Anesthesia Plan  ASA: III  Anesthesia Plan: Epidural   Post-op Pain Management:    Induction:   Airway Management Planned:   Additional Equipment:   Intra-op Plan:   Post-operative Plan:   Informed Consent: I have reviewed the patients History and Physical, chart, labs and discussed the procedure including the risks, benefits and alternatives for the proposed anesthesia with the patient or authorized representative who has indicated his/her understanding and acceptance.     Plan Discussed with:   Anesthesia Plan Comments:         Anesthesia Quick Evaluation

## 2015-01-14 NOTE — Progress Notes (Signed)
[facility]. When patient was getting up on steady pt started to feel dizzy. Patient back in bed to lay down.

## 2015-01-15 DIAGNOSIS — O403XX0 Polyhydramnios, third trimester, not applicable or unspecified: Secondary | ICD-10-CM | POA: Diagnosis not present

## 2015-01-15 MED ORDER — IBUPROFEN 600 MG PO TABS: 600.0000 mg | ORAL_TABLET | Freq: Four times a day (QID) | ORAL | Status: DC

## 2015-01-15 MED ORDER — MEDROXYPROGESTERONE ACETATE 150 MG/ML IM SUSP: 150.0000 mg | INTRAMUSCULAR | Status: AC

## 2015-01-15 MED ORDER — OXYCODONE-ACETAMINOPHEN 5-325 MG PO TABS: 1.0000 | ORAL_TABLET | ORAL | Status: DC | PRN

## 2015-01-15 MED ORDER — MEASLES, MUMPS & RUBELLA VAC ~~LOC~~ INJ
0.5000 mL | INJECTION | Freq: Once | SUBCUTANEOUS | Status: AC
  Administered 2015-01-15: 0.5 mL via SUBCUTANEOUS
  Filled 2015-01-15: qty 0.5

## 2015-01-15 NOTE — Progress Notes (Signed)
Clinical Social Work Department PSYCHOSOCIAL ASSESSMENT - MATERNAL/CHILD 08/12/2015  Patient:  Stacy Blackwell, SAEPHAN  Account Number:  [id number]  [place] Date:  2014-11-26  Ardine Eng Name:   Hanley Seamen    Clinical Social Worker:  Kelyse Pask, LCSW   Date/Time:  09/01/2015 02:00 PM  Date Referred:  04-05-15   Referral source  Central Nursery     Referred reason  Depression/Anxiety   Other referral source:    I:  FAMILY / [place] legal guardian:  PARENT  Guardian - Name Guardian - Age Guardian - Address  [address]  [place], [place] [address]  Winona Leg[place], [place]     Other household support members/support persons Other support:    II  PSYCHOSOCIAL DATA Information Source:    Insurance risk surveyor Resources Employment:   Publishing rights manager resources:  [employer] If [place]:   Other  [place] / Grade:   Maternity Care Coordinator / Child Services Coordination / Early Interventions:  Cultural issues impacting care:    III  STRENGTHS Strengths  Supportive family/friends  Home prepared for Child (including basic supplies)  Adequate Resources   Strength comment:    IV  RISK FACTORS AND CURRENT PROBLEMS Current Problem:       V  SOCIAL WORK ASSESSMENT Acknowledged order for social work consult to assess mother's hx of Depression.   Met with mother who was pleasant and receptive to social work.  She is a single parent with one other dependent age 20.  FOB provides limited support according to mother.   Mother states that she has hx of depression and states that it is currently in remission.  She notes that last time she had depressive symptoms was four years ago after her mother died.  She denies currently being on medication and states that it has been awhile since she was treated with medication.   Mother states that she was receiving therapy at [facility] but has not been in  therapy for a while.   She denies any current symptoms of anxiety or depression.  She denies any hx of illicit drug use.    No acute social concerns reported or noted at this time.      VI SOCIAL WORK PLAN Social Work Plan  No Further Intervention Required / No Barriers to Discharge   Type of pt/family education:   PP Depression information and resources   If child protective services report - county:   If child protective services report - date:   Information/referral to community resources comment:   Other social work plan:

## 2015-01-15 NOTE — Lactation Note (Signed)
This note was copied from the chart of Stacy Alease FrameStephanie Demore. Lactation Consultation Note  Initial visit done.  Breastfeeding consultation services and support information.  Mom has given a lot of bottles and states she only desires to breastfeed while in the hospital.  She breastfed her first baby for 8 months(6 years ago.)  Mom states she knows the benefits for both she and the baby.  Encouraged to call for latch assist prn.  Patient Name: Stacy Alease FrameStephanie Blackwell AVWUJ'WToday's Date: 01/15/2015 Reason for consult: Initial assessment   Maternal Data    Feeding    LATCH Score/Interventions                      Lactation Tools Discussed/Used     Consult Status      Huston FoleyMOULDEN, Zayd Bonet S 01/15/2015, 11:25 AM

## 2015-01-15 NOTE — Discharge Summary (Signed)
Obstetric Discharge Summary Reason for Admission: rupture of membranes Prenatal Procedures: ultrasound Intrapartum Procedures: spontaneous vaginal delivery Postpartum Procedures: none Complications-Operative and Postpartum: none HEMOGLOBIN  Date Value Ref Range Status  01/14/2015 10.7* 12.0 - 15.0 g/dL Final   HCT  Date Value Ref Range Status  01/14/2015 32.3* 36.0 - 46.0 % Final    Physical Exam:  General: alert, cooperative, appears stated age and no distress Lochia: appropriate Uterine Fundus: firm Incision: n/a DVT Evaluation: No evidence of DVT seen on physical exam. Negative Homan's sign. No cords or calf tenderness. No significant calf/ankle edema.  Discharge Diagnoses: Term Pregnancy-delivered  Discharge Information: Date: 01/15/2015 Activity: pelvic rest Diet: routine Medications: PNV, Ibuprofen and Percocet Condition: stable and improved Instructions: refer to practice specific booklet Discharge to: home   Newborn Data: Live born female  Birth Weight: 5 lb 14 oz (2665 g) APGAR: 8, 9  Home with mother.  LAWSON, MARIE DARLENE 01/15/2015, 9:07 AM

## 2015-01-15 NOTE — Anesthesia Postprocedure Evaluation (Signed)
Anesthesia Post Note  Patient: Aram BeechamStephanie R Flath  Procedure(s) Performed: * No procedures listed *  Anesthesia type: Epidural  Patient location: Mother/Baby  Post pain: Pain level controlled  Post assessment: Post-op Vital signs reviewed  Last Vitals:  Filed Vitals:   01/15/15 0745  BP: 113/41  Pulse: 75  Temp: 36.4 C  Resp: 20    Post vital signs: Reviewed  Level of consciousness:alert  Complications: No apparent anesthesia complications

## 2015-01-16 ENCOUNTER — Ambulatory Visit (HOSPITAL_COMMUNITY): Admission: RE | Admit: 2015-01-16 | Payer: Medicare Other | Source: Ambulatory Visit

## 2015-01-16 ENCOUNTER — Ambulatory Visit: Payer: Self-pay

## 2015-01-16 NOTE — Lactation Note (Signed)
This note was copied from the chart of Stacy Alease FrameStephanie Myint. Lactation Consultation Note  Follow up visit made prior to discharge.  Mom states she now feels like she will continue to breastfeed after discharge.  She states the baby is latching very good but acts hungry after feedings.  Explained to mom that baby is used to getting more volume by bottle.  Discussed milk coming to volume.  Instructed to always breastfeed first and supplement with small amounts of formula if baby still acts hungry.  Recommended she discontinue formula when milk comes in to maintain supply.  Patient Name: Stacy Alease FrameStephanie Blackwell [id number]'s Date: 01/16/2015     Maternal Data    Feeding Feeding Type: Breast Fed Length of feed: 0 min (tongue thrusting)  LATCH Score/Interventions                      Lactation Tools Discussed/Used     Consult Status      Huston FoleyMOULDEN, Elishah Ashmore S 01/16/2015, 10:15 AM

## 2015-01-17 ENCOUNTER — Inpatient Hospital Stay (HOSPITAL_COMMUNITY): Admission: RE | Admit: 2015-01-17 | Payer: Medicare Other | Source: Ambulatory Visit

## 2015-01-23 NOTE — Progress Notes (Signed)
Post discharge chart review completed.  

## 2015-02-24 DIAGNOSIS — N760 Acute vaginitis: Secondary | ICD-10-CM | POA: Diagnosis not present

## 2015-02-24 DIAGNOSIS — Z3042 Encounter for surveillance of injectable contraceptive: Secondary | ICD-10-CM | POA: Diagnosis not present

## 2015-02-28 ENCOUNTER — Emergency Department (HOSPITAL_COMMUNITY)
Admission: EM | Admit: 2015-02-28 | Discharge: 2015-02-28 | Disposition: A | Discharge status: Home / Self Care | Payer: Medicare Other | Attending: Emergency Medicine | Admitting: Emergency Medicine

## 2015-02-28 ENCOUNTER — Encounter (HOSPITAL_COMMUNITY): Payer: Self-pay | Admitting: *Deleted

## 2015-02-28 ENCOUNTER — Emergency Department (HOSPITAL_COMMUNITY): Payer: Medicare Other

## 2015-02-28 DIAGNOSIS — R05 Cough: Secondary | ICD-10-CM | POA: Diagnosis present

## 2015-02-28 DIAGNOSIS — K219 Gastro-esophageal reflux disease without esophagitis: Secondary | ICD-10-CM | POA: Diagnosis not present

## 2015-02-28 DIAGNOSIS — Z880 Allergy status to penicillin: Secondary | ICD-10-CM | POA: Insufficient documentation

## 2015-02-28 DIAGNOSIS — Z720 Tobacco use: Secondary | ICD-10-CM | POA: Diagnosis not present

## 2015-02-28 DIAGNOSIS — J189 Pneumonia, unspecified organism: Secondary | ICD-10-CM | POA: Diagnosis not present

## 2015-02-28 DIAGNOSIS — Z8659 Personal history of other mental and behavioral disorders: Secondary | ICD-10-CM | POA: Diagnosis not present

## 2015-02-28 DIAGNOSIS — Z79899 Other long term (current) drug therapy: Secondary | ICD-10-CM | POA: Diagnosis not present

## 2015-02-28 DIAGNOSIS — J159 Unspecified bacterial pneumonia: Secondary | ICD-10-CM | POA: Insufficient documentation

## 2015-02-28 DIAGNOSIS — Z8742 Personal history of other diseases of the female genital tract: Secondary | ICD-10-CM | POA: Diagnosis not present

## 2015-02-28 DIAGNOSIS — Z7951 Long term (current) use of inhaled steroids: Secondary | ICD-10-CM | POA: Insufficient documentation

## 2015-02-28 DIAGNOSIS — J45909 Unspecified asthma, uncomplicated: Secondary | ICD-10-CM | POA: Insufficient documentation

## 2015-02-28 DIAGNOSIS — E669 Obesity, unspecified: Secondary | ICD-10-CM | POA: Diagnosis not present

## 2015-02-28 DIAGNOSIS — F17210 Nicotine dependence, cigarettes, uncomplicated: Secondary | ICD-10-CM | POA: Diagnosis not present

## 2015-02-28 LAB — CBC WITH DIFFERENTIAL/PLATELET
BASOS ABS: 0 10*3/uL (ref 0.0–0.1)
BASOS PCT: 0 % (ref 0–1)
EOS ABS: 0 10*3/uL (ref 0.0–0.7)
EOS PCT: 0 % (ref 0–5)
HEMATOCRIT: 36.2 % (ref 36.0–46.0)
Hemoglobin: 12.2 g/dL (ref 12.0–15.0)
Lymphocytes Relative: 42 % (ref 12–46)
Lymphs Abs: 1.2 10*3/uL (ref 0.7–4.0)
MCH: 28.8 pg (ref 26.0–34.0)
MCHC: 33.7 g/dL (ref 30.0–36.0)
MCV: 85.4 fL (ref 78.0–100.0)
MONO ABS: 0.2 10*3/uL (ref 0.1–1.0)
Monocytes Relative: 6 % (ref 3–12)
Neutro Abs: 1.4 10*3/uL — ABNORMAL LOW (ref 1.7–7.7)
Neutrophils Relative %: 52 % (ref 43–77)
PLATELETS: 182 10*3/uL (ref 150–400)
RBC: 4.24 MIL/uL (ref 3.87–5.11)
RDW: 15.1 % (ref 11.5–15.5)
WBC: 2.8 10*3/uL — AB (ref 4.0–10.5)

## 2015-02-28 LAB — BASIC METABOLIC PANEL
ANION GAP: 9 (ref 5–15)
BUN: <5 mg/dL — ABNORMAL LOW (ref 6–20)
CHLORIDE: 105 mmol/L (ref 101–111)
CO2: 22 mmol/L (ref 22–32)
CREATININE: 0.96 mg/dL (ref 0.44–1.00)
Calcium: 8 mg/dL — ABNORMAL LOW (ref 8.9–10.3)
GFR calc Af Amer: >60 mL/min (ref >60)
GFR calc non Af Amer: >60 mL/min (ref >60)
Glucose, Bld: 92 mg/dL (ref 70–99)
Potassium: 3.5 mmol/L (ref 3.5–5.1)
SODIUM: 136 mmol/L (ref 135–145)

## 2015-02-28 LAB — I-STAT TROPONIN, ED: TROPONIN I, POC: 0 ng/mL (ref 0.00–0.08)

## 2015-02-28 MED ORDER — PREDNISONE 10 MG PO TABS: 20.0000 mg | ORAL_TABLET | Freq: Two times a day (BID) | ORAL | Status: DC

## 2015-02-28 MED ORDER — ALBUTEROL SULFATE (2.5 MG/3ML) 0.083% IN NEBU
5.0000 mg | INHALATION_SOLUTION | Freq: Once | RESPIRATORY_TRACT | Status: AC
  Administered 2015-02-28: 5 mg via RESPIRATORY_TRACT
  Filled 2015-02-28: qty 6

## 2015-02-28 MED ORDER — AZITHROMYCIN 250 MG PO TABS: ORAL_TABLET | ORAL | Status: DC

## 2015-02-28 NOTE — ED Provider Notes (Signed)
CSN: [id number]     Arrival date & time 02/28/15  0914 History   First MD Initiated Contact with Patient 02/28/15 0935     Chief Complaint  Patient presents with  . Cough  . Chest Pain     (Consider location/radiation/quality/duration/timing/severity/associated sxs/prior Treatment) HPI Comments: Patient is a 34 year old female with past medical history of asthma. She presents with a three-day history of cough productive of yellow sputum, tightness, and burning in her chest. She states that she cost to the point where she urinates on herself. She denies any burning with urination or any frequency. She does report having a vaginal delivery one month ago. She has had little relief with her inhaler and albuterol nebulizer at home.  Patient is a 34 y.o. female presenting with cough. The history is provided by the patient.  Cough Cough characteristics:  Productive Sputum characteristics:  Yellow Severity:  Moderate Onset quality:  Sudden Duration:  3 days Timing:  Constant Progression:  Worsening Chronicity:  New Smoker: yes   Relieved by:  Nothing Worsened by:  Nothing tried Ineffective treatments:  Beta-agonist inhaler   Past Medical History  Diagnosis Date  . Asthma   . Vertigo   . Acid reflux   . Obesity   . Ovarian cyst   . Depression     doing good, no meds   Past Surgical History  Procedure Laterality Date  . Cyst excision Left 1987   Family History  Problem Relation Age of Onset  . Asthma Mother   . Diabetes Mother   . Heart disease Mother     CHF  . Asthma Sister   . Diabetes Sister   . Heart disease Sister   . Hearing loss Neg Hx    History  Substance Use Topics  . Smoking status: Current Every Day Smoker -- 0.25 packs/day for 10 years    Types: Cigarettes  . Smokeless tobacco: Never Used  . Alcohol Use: No   OB History    Gravida Para Term Preterm AB TAB SAB Ectopic Multiple Living   2 2 2  0 0 0 0 0 0 2     Review of Systems  Respiratory:  Positive for cough.   All other systems reviewed and are negative.     Allergies  Amoxicillin  Home Medications   Prior to Admission medications   Medication Sig Start Date End Date Taking? Authorizing Provider  albuterol (PROVENTIL HFA;VENTOLIN HFA) 108 (90 BASE) MCG/ACT inhaler Inhale 2 puffs into the lungs every 6 (six) hours as needed for wheezing or shortness of breath. 08/02/14   Marny LowensteinJulie N Wenzel, PA-C  albuterol (PROVENTIL) (5 MG/ML) 0.5% nebulizer solution Take 2.5 mg by nebulization every 6 (six) hours as needed for wheezing or shortness of breath.    Historical Provider, MD  budesonide-formoterol (SYMBICORT) 160-4.5 MCG/ACT inhaler Inhale 2 puffs into the lungs 2 (two) times daily. 08/17/14   Leslye Peerobert S Byrum, MD  cetirizine (ZYRTEC) 10 MG tablet Take 1 tablet (10 mg total) by mouth daily. 08/17/14   Leslye Peerobert S Byrum, MD  fluticasone (FLONASE) 50 MCG/ACT nasal spray Place 2 sprays into both nostrils daily. 12/28/14   Leslye Peerobert S Byrum, MD  ibuprofen (ADVIL,MOTRIN) 600 MG tablet Take 1 tablet (600 mg total) by mouth every 6 (six) hours. 01/15/15   Montez MoritaMarie D Lawson, CNM  medroxyPROGESTERone (DEPO-PROVERA) 150 MG/ML injection Inject 1 mL (150 mg total) into the muscle every 3 (three) months. 01/15/15   Montez MoritaMarie D Lawson, CNM  oxyCODONE-acetaminophen (PERCOCET/ROXICET)  5-325 MG per tablet Take 1 tablet by mouth every 4 (four) hours as needed (for pain scale 4-7). 01/15/15   Montez MoritaMarie D Lawson, CNM  prenatal vitamin w/FE, FA (NATACHEW) 29-1 MG CHEW chewable tablet Chew 1 tablet by mouth daily at 12 noon.    Historical Provider, MD  ranitidine (ZANTAC) 150 MG tablet Take 150 mg by mouth 2 (two) times daily.    Historical Provider, MD   BP 112/51 mmHg  Pulse 83  Temp(Src) 98 F (36.7 C) (Oral)  Resp 20  Ht 5\' 2"  (1.575 m)  Wt 260 lb (117.935 kg)  BMI 47.54 kg/m2  SpO2 100%  Breastfeeding? No Physical Exam  Constitutional: She is oriented to person, place, and time. She appears well-developed and  well-nourished. No distress.  HENT:  Head: Normocephalic and atraumatic.  Neck: Normal range of motion. Neck supple.  Cardiovascular: Normal rate and regular rhythm.  Exam reveals no gallop and no friction rub.   No murmur heard. Pulmonary/Chest: Effort normal. No respiratory distress. She has wheezes. She has no rales. She exhibits no tenderness.  There are mild to moderate expiratory wheezes present bilaterally.  Abdominal: Soft. Bowel sounds are normal. She exhibits no distension. There is no tenderness.  Musculoskeletal: Normal range of motion.  Neurological: She is alert and oriented to person, place, and time.  Skin: Skin is warm and dry. She is not diaphoretic.  Nursing note and vitals reviewed.   ED Course  Procedures (including critical care time) Labs Review Labs Reviewed  BASIC METABOLIC PANEL  CBC WITH DIFFERENTIAL/PLATELET  I-STAT TROPOININ, ED    Imaging Review No results found.  ED ECG REPORT   Date: 02/28/2015  Rate: 93  Rhythm: normal sinus rhythm  QRS Axis: normal  Intervals: normal  ST/T Wave abnormalities: normal  Conduction Disutrbances:none  Narrative Interpretation:   Old EKG Reviewed: none available  I have personally reviewed the EKG tracing and agree with the computerized printout as noted.   MDM   Final diagnoses:  None    Chest x-ray reveals a right middle lobe infiltrate. She is not tachypneic or hypoxic and appears appropriate for outpatient treatment. She will be prescribed Zithromax and prednisone and advised to continue her breathing treatments at home. She is also counseled about the importance of smoking cessation. She understands to return if her symptoms worsen or change.    Geoffery Lyonsouglas Mcgregor Tinnon, MD 02/28/15 1159

## 2015-02-28 NOTE — Discharge Instructions (Signed)
Zithromax as prescribed. Prednisone as prescribed.  Continue your albuterol treatment every 4 hours as needed for wheezing.  Speak with your doctor about smoking cessation options.  Return to the ER if your symptoms significantly worsen or change.   Pneumonia Pneumonia is an infection of the lungs.  CAUSES Pneumonia may be caused by bacteria or a virus. Usually, these infections are caused by breathing infectious particles into the lungs (respiratory tract). SIGNS AND SYMPTOMS   Cough.  Fever.  Chest pain.  Increased rate of breathing.  Wheezing.  Mucus production. DIAGNOSIS  If you have the common symptoms of pneumonia, your health care provider will typically confirm the diagnosis with a chest X-ray. The X-ray will show an abnormality in the lung (pulmonary infiltrate) if you have pneumonia. Other tests of your blood, urine, or sputum may be done to find the specific cause of your pneumonia. Your health care provider may also do tests (blood gases or pulse oximetry) to see how well your lungs are working. TREATMENT  Some forms of pneumonia may be spread to other people when you cough or sneeze. You may be asked to wear a mask before and during your exam. Pneumonia that is caused by bacteria is treated with antibiotic medicine. Pneumonia that is caused by the influenza virus may be treated with an antiviral medicine. Most other viral infections must run their course. These infections will not respond to antibiotics.  HOME CARE INSTRUCTIONS   Cough suppressants may be used if you are losing too much rest. However, coughing protects you by clearing your lungs. You should avoid using cough suppressants if you can.  Your health care provider may have prescribed medicine if he or she thinks your pneumonia is caused by bacteria or influenza. Finish your medicine even if you start to feel better.  Your health care provider may also prescribe an expectorant. This loosens the mucus to be  coughed up.  Take medicines only as directed by your health care provider.  Do not smoke. Smoking is a common cause of bronchitis and can contribute to pneumonia. If you are a smoker and continue to smoke, your cough may last several weeks after your pneumonia has cleared.  A cold steam vaporizer or humidifier in your room or home may help loosen mucus.  Coughing is often worse at night. Sleeping in a semi-upright position in a recliner or using a couple pillows under your head will help with this.  Get rest as you feel it is needed. Your body will usually let you know when you need to rest. PREVENTION A pneumococcal shot (vaccine) is available to prevent a common bacterial cause of pneumonia. This is usually suggested for:  People over 807 years old.  Patients on chemotherapy.  People with chronic lung problems, such as bronchitis or emphysema.  People with immune system problems. If you are over 65 or have a high risk condition, you may receive the pneumococcal vaccine if you have not received it before. In some countries, a routine influenza vaccine is also recommended. This vaccine can help prevent some cases of pneumonia.You may be offered the influenza vaccine as part of your care. If you smoke, it is time to quit. You may receive instructions on how to stop smoking. Your health care provider can provide medicines and counseling to help you quit. SEEK MEDICAL CARE IF: You have a fever. SEEK IMMEDIATE MEDICAL CARE IF:   Your illness becomes worse. This is especially true if you are elderly or  weakened from any other disease.  You cannot control your cough with suppressants and are losing sleep.  You begin coughing up blood.  You develop pain which is getting worse or is uncontrolled with medicines.  Any of the symptoms which initially brought you in for treatment are getting worse rather than better.  You develop shortness of breath or chest pain. MAKE SURE YOU:    Understand these instructions.  Will watch your condition.  Will get help right away if you are not doing well or get worse. Document Released: 10/07/2005 Document Revised: 02/21/2014 Document Reviewed: 12/27/2010 [facility] Patient Information 2015 [employer] [place]. This information is not intended to replace advice given to you by your health care provider. Make sure you discuss any questions you have with your health care provider.

## 2015-02-28 NOTE — ED Notes (Signed)
Pt is in stable condition upon d/c and ambulates from ED escorted by this RN. 

## 2015-02-28 NOTE — ED Notes (Signed)
Pt states she has tried her albuterol neb at home with no relief.

## 2015-02-28 NOTE — ED Notes (Signed)
Pt has c/o cough since Saturday. Pt states her chest feels like it's burning when she coughs. Pt denies cp at rest. Pt states she is also urinating on herself when she coughs and reports she had a baby 1 month ago.

## 2015-03-02 ENCOUNTER — Ambulatory Visit: Payer: Medicare Other | Admitting: Emergency Medicine

## 2015-03-15 ENCOUNTER — Other Ambulatory Visit: Payer: Self-pay | Admitting: *Deleted

## 2015-03-15 DIAGNOSIS — J45901 Unspecified asthma with (acute) exacerbation: Secondary | ICD-10-CM

## 2015-03-15 MED ORDER — CETIRIZINE HCL 10 MG PO TABS: 10.0000 mg | ORAL_TABLET | Freq: Every day | ORAL | Status: DC

## 2015-03-15 MED ORDER — BUDESONIDE-FORMOTEROL FUMARATE 160-4.5 MCG/ACT IN AERO: 2.0000 | INHALATION_SPRAY | Freq: Two times a day (BID) | RESPIRATORY_TRACT | Status: DC

## 2015-03-15 MED ORDER — FLUTICASONE PROPIONATE 50 MCG/ACT NA SUSP: 2.0000 | Freq: Every day | NASAL | Status: DC

## 2015-03-21 ENCOUNTER — Ambulatory Visit: Payer: Medicare Other | Admitting: Emergency Medicine

## 2015-03-24 ENCOUNTER — Encounter (HOSPITAL_COMMUNITY): Payer: Self-pay | Admitting: Emergency Medicine

## 2015-03-24 ENCOUNTER — Emergency Department (HOSPITAL_COMMUNITY)
Admission: EM | Admit: 2015-03-24 | Discharge: 2015-03-24 | Disposition: A | Discharge status: Home / Self Care | Payer: Medicare Other | Attending: Emergency Medicine | Admitting: Emergency Medicine

## 2015-03-24 ENCOUNTER — Emergency Department (HOSPITAL_COMMUNITY): Payer: Medicare Other

## 2015-03-24 DIAGNOSIS — Z8742 Personal history of other diseases of the female genital tract: Secondary | ICD-10-CM | POA: Insufficient documentation

## 2015-03-24 DIAGNOSIS — Z8659 Personal history of other mental and behavioral disorders: Secondary | ICD-10-CM | POA: Insufficient documentation

## 2015-03-24 DIAGNOSIS — E669 Obesity, unspecified: Secondary | ICD-10-CM | POA: Insufficient documentation

## 2015-03-24 DIAGNOSIS — Z720 Tobacco use: Secondary | ICD-10-CM | POA: Diagnosis not present

## 2015-03-24 DIAGNOSIS — Z79899 Other long term (current) drug therapy: Secondary | ICD-10-CM | POA: Diagnosis not present

## 2015-03-24 DIAGNOSIS — J45909 Unspecified asthma, uncomplicated: Secondary | ICD-10-CM | POA: Diagnosis not present

## 2015-03-24 DIAGNOSIS — M25562 Pain in left knee: Secondary | ICD-10-CM | POA: Insufficient documentation

## 2015-03-24 DIAGNOSIS — Z7951 Long term (current) use of inhaled steroids: Secondary | ICD-10-CM | POA: Insufficient documentation

## 2015-03-24 DIAGNOSIS — Z880 Allergy status to penicillin: Secondary | ICD-10-CM | POA: Insufficient documentation

## 2015-03-24 DIAGNOSIS — K219 Gastro-esophageal reflux disease without esophagitis: Secondary | ICD-10-CM | POA: Diagnosis not present

## 2015-03-24 MED ORDER — OXYCODONE-ACETAMINOPHEN 5-325 MG PO TABS: 1.0000 | ORAL_TABLET | Freq: Four times a day (QID) | ORAL | Status: DC | PRN

## 2015-03-24 MED ORDER — NAPROXEN 500 MG PO TABS: 500.0000 mg | ORAL_TABLET | Freq: Two times a day (BID) | ORAL | Status: DC

## 2015-03-24 MED ORDER — OXYCODONE-ACETAMINOPHEN 5-325 MG PO TABS
2.0000 | ORAL_TABLET | Freq: Once | ORAL | Status: AC
  Administered 2015-03-24: 2 via ORAL
  Filled 2015-03-24: qty 2

## 2015-03-24 NOTE — ED Notes (Signed)
Pt c/o left knee pain, started couple days ago, hurts constantly, unable to sleep--- was here 3 weeks ago with pneumonia, is 2 months postpartum.

## 2015-03-24 NOTE — ED Provider Notes (Signed)
CSN: [id number]     Arrival date & time 03/24/15  0729 History   First MD Initiated Contact with Patient 03/24/15 0750     Chief Complaint  Patient presents with  . Knee Pain     (Consider location/radiation/quality/duration/timing/severity/associated sxs/prior Treatment) Patient is a 34 y.o. female presenting with knee pain. The history is provided by the patient. No language interpreter was used.  Knee Pain Location:  Knee Injury: no   Knee location:  L knee Pain details:    Quality:  Dull   Radiates to:  Does not radiate   Severity:  Moderate   Onset quality:  Gradual   Duration:  2 days   Timing:  Constant   Progression:  Unchanged Chronicity:  New Dislocation: no   Foreign body present:  No foreign bodies Tetanus status:  Unknown Prior injury to area:  No Relieved by:  Nothing Worsened by:  Bearing weight, extension, flexion and rotation Ineffective treatments:  Ice (advil) Associated symptoms: decreased ROM   Associated symptoms: no back pain, no fatigue, no fever, no itching, no neck pain, no numbness, no stiffness, no swelling and no tingling   Risk factors: obesity     Past Medical History  Diagnosis Date  . Asthma   . Vertigo   . Acid reflux   . Obesity   . Ovarian cyst   . Depression     doing good, no meds   Past Surgical History  Procedure Laterality Date  . Cyst excision Left 1987   Family History  Problem Relation Age of Onset  . Asthma Mother   . Diabetes Mother   . Heart disease Mother     CHF  . Asthma Sister   . Diabetes Sister   . Heart disease Sister   . Hearing loss Neg Hx    History  Substance Use Topics  . Smoking status: Current Every Day Smoker -- 0.25 packs/day for 10 years    Types: Cigarettes  . Smokeless tobacco: Never Used  . Alcohol Use: No   OB History    Gravida Para Term Preterm AB TAB SAB Ectopic Multiple Living   0 0 0 0 0 0 2     Review of Systems  Constitutional: Negative for fever, chills,  diaphoresis, activity change, appetite change and fatigue.  HENT: Negative for congestion, facial swelling, rhinorrhea and sore throat.   Eyes: Negative for photophobia and discharge.  Respiratory: Negative for cough, chest tightness and shortness of breath.   Cardiovascular: Negative for chest pain, palpitations and leg swelling.  Gastrointestinal: Negative for nausea, vomiting, abdominal pain and diarrhea.  Endocrine: Negative for polydipsia and polyuria.  Genitourinary: Negative for dysuria, frequency, difficulty urinating and pelvic pain.  Musculoskeletal: Negative for back pain, arthralgias, stiffness, neck pain and neck stiffness.  Skin: Negative for color change, itching and wound.  Allergic/Immunologic: Negative for immunocompromised state.  Neurological: Negative for facial asymmetry, weakness, numbness and headaches.  Hematological: Does not bruise/bleed easily.  Psychiatric/Behavioral: Negative for confusion and agitation.      Allergies  Amoxicillin  Home Medications   Prior to Admission medications   Medication Sig Start Date End Date Taking? Authorizing Provider  albuterol (PROVENTIL HFA;VENTOLIN HFA) 108 (90 BASE) MCG/ACT inhaler Inhale 2 puffs into the lungs every 6 (six) hours as needed for wheezing or shortness of breath. 08/02/14  Yes Marny Lowenstein, PA-C  albuterol (PROVENTIL) (5 MG/ML) 0.5% nebulizer solution Take 2.5 mg by nebulization every 6 (six) hours  as needed for wheezing or shortness of breath.   Yes Historical Provider, MD  budesonide-formoterol (SYMBICORT) 160-4.5 MCG/ACT inhaler Inhale 2 puffs into the lungs 2 (two) times daily. 03/15/15  Yes Leslye Peerobert S Byrum, MD  cetirizine (ZYRTEC) 10 MG tablet Take 1 tablet (10 mg total) by mouth daily. 03/15/15  Yes Leslye Peerobert S Byrum, MD  fluticasone (FLONASE) 50 MCG/ACT nasal spray Place 2 sprays into both nostrils daily. 03/15/15  Yes Leslye Peerobert S Byrum, MD  medroxyPROGESTERone (DEPO-PROVERA) 150 MG/ML injection Inject 1 mL  (150 mg total) into the muscle every 3 (three) months. Patient taking differently: Inject 150 mg into the muscle every 3 (three) months. Pt received last dose on 02/24/15 - next dose due 05/27/15 01/15/15  Yes Montez MoritaMarie D Lawson, CNM  naproxen (NAPROSYN) 500 MG tablet Take 1 tablet (500 mg total) by mouth 2 (two) times daily with a meal. 03/24/15   Toy CookeyMegan Docherty, MD  oxyCODONE-acetaminophen (PERCOCET) 5-325 MG per tablet Take 1 tablet by mouth every 6 (six) hours as needed (For breakthrough pain not controlled by naproxen). 03/24/15   Toy CookeyMegan Docherty, MD  ranitidine (ZANTAC) 150 MG tablet Take 150 mg by mouth 2 (two) times daily.    Historical Provider, MD   BP 120/62 mmHg  Pulse 87  Temp(Src) 97.6 F (36.4 C) (Oral)  Resp 18  SpO2 100%  Breastfeeding? No Physical Exam  Constitutional: She is oriented to person, place, and time. She appears well-developed and well-nourished. No distress.  HENT:  Head: Normocephalic and atraumatic.  Mouth/Throat: No oropharyngeal exudate.  Eyes: Pupils are equal, round, and reactive to light.  Neck: Normal range of motion. Neck supple.  Cardiovascular: Normal rate, regular rhythm and normal heart sounds.  Exam reveals no gallop and no friction rub.   No murmur heard. Pulmonary/Chest: Effort normal and breath sounds normal. No respiratory distress. She has no wheezes. She has no rales.  Abdominal: Soft. Bowel sounds are normal. She exhibits no distension and no mass. There is no tenderness. There is no rebound and no guarding.  Musculoskeletal: She exhibits no edema.       Left knee: She exhibits decreased range of motion. She exhibits no swelling, no effusion, no ecchymosis, no deformity, no erythema, normal alignment, no LCL laxity, normal patellar mobility and no MCL laxity. Tenderness found. Medial joint line tenderness noted.  Neurological: She is alert and oriented to person, place, and time.  Skin: Skin is warm and dry.  Psychiatric: She has a normal mood and  affect.    ED Course  Procedures (including critical care time) Labs Review Labs Reviewed - No data to display  Imaging Review Dg Knee Complete 4 Views Left  03/24/2015   CLINICAL DATA:  Pain medially.  No history of trauma  EXAM: LEFT KNEE - COMPLETE 4+ VIEW  COMPARISON:  July 24, 2011  FINDINGS: Frontal, lateral, and bilateral oblique views were obtained. There is no fracture or dislocation. No appreciable effusion. There is no appreciable joint space narrowing. No erosive change or intra-articular calcification.  IMPRESSION: No fracture or joint effusion.  No appreciable arthropathic change.   Electronically Signed   By: Bretta BangWilliam  Woodruff III M.D.   On: 03/24/2015 08:22     EKG Interpretation None      MDM   Final diagnoses:  Left medial knee pain    Pt is a 34 y.o. female with Pmhx as above who presents with 2 days of gradual onset L medial knee pain w/o known injury, no fever,  chills, skin changes. +medial knee ttp, no laxity. XR knee nml. WIll d/ home w/ instructions for supportive care with rest, ice, elevation, knee immobilizer, crutches, naproxen, percocet for breakthrough pain. Outpt f/u info given for ortho if still having pain in 2 weeks.       Aram Beecham evaluation in the Emergency Department is complete. It has been determined that no acute conditions requiring further emergency intervention are present at this time. The patient/guardian have been advised of the diagnosis and plan. We have discussed signs and symptoms that warrant return to the ED, such as changes or worsening in symptoms, worsening pain, fever, redness, swelling.       Toy Cookey, MD 03/24/15 [phone]

## 2015-03-24 NOTE — Progress Notes (Signed)
Orthopedic Tech Progress Note Patient Details:  Stacy BeechamStephanie R Blackwell 02/23/1981 [id number]  Ortho Devices Type of Ortho Device: Crutches, Knee Immobilizer Ortho Device/Splint Location: lle Ortho Device/Splint Interventions: Application   Nicholaos Schippers 03/24/2015, 9:23 AM

## 2015-03-24 NOTE — Discharge Instructions (Signed)

## 2015-04-07 ENCOUNTER — Ambulatory Visit: Payer: Medicare Other | Admitting: Emergency Medicine

## 2015-08-02 DIAGNOSIS — M25569 Pain in unspecified knee: Secondary | ICD-10-CM | POA: Diagnosis not present

## 2015-08-02 DIAGNOSIS — E669 Obesity, unspecified: Secondary | ICD-10-CM | POA: Diagnosis not present

## 2015-08-02 DIAGNOSIS — R42 Dizziness and giddiness: Secondary | ICD-10-CM | POA: Diagnosis not present

## 2015-08-02 DIAGNOSIS — Z13220 Encounter for screening for lipoid disorders: Secondary | ICD-10-CM | POA: Diagnosis not present

## 2015-08-02 DIAGNOSIS — Z1389 Encounter for screening for other disorder: Secondary | ICD-10-CM | POA: Diagnosis not present

## 2015-08-02 DIAGNOSIS — Z3202 Encounter for pregnancy test, result negative: Secondary | ICD-10-CM | POA: Diagnosis not present

## 2015-08-02 DIAGNOSIS — Z131 Encounter for screening for diabetes mellitus: Secondary | ICD-10-CM | POA: Diagnosis not present

## 2015-08-02 DIAGNOSIS — Z6841 Body Mass Index (BMI) 40.0 and over, adult: Secondary | ICD-10-CM | POA: Diagnosis not present

## 2015-08-02 DIAGNOSIS — J4522 Mild intermittent asthma with status asthmaticus: Secondary | ICD-10-CM | POA: Diagnosis not present

## 2015-09-07 DIAGNOSIS — Z3042 Encounter for surveillance of injectable contraceptive: Secondary | ICD-10-CM | POA: Diagnosis not present

## 2015-12-06 DIAGNOSIS — Z3042 Encounter for surveillance of injectable contraceptive: Secondary | ICD-10-CM | POA: Diagnosis not present

## 2015-12-06 DIAGNOSIS — Z3009 Encounter for other general counseling and advice on contraception: Secondary | ICD-10-CM | POA: Diagnosis not present

## 2016-02-27 ENCOUNTER — Ambulatory Visit (HOSPITAL_COMMUNITY)
Admission: EM | Admit: 2016-02-27 | Discharge: 2016-02-27 | Disposition: A | Discharge status: Home / Self Care | Payer: Medicare Other | Attending: Emergency Medicine | Admitting: Emergency Medicine

## 2016-02-27 ENCOUNTER — Encounter (HOSPITAL_COMMUNITY): Payer: Self-pay | Admitting: Emergency Medicine

## 2016-02-27 ENCOUNTER — Ambulatory Visit (INDEPENDENT_AMBULATORY_CARE_PROVIDER_SITE_OTHER): Payer: Medicare Other

## 2016-02-27 DIAGNOSIS — S8001XA Contusion of right knee, initial encounter: Secondary | ICD-10-CM

## 2016-02-27 DIAGNOSIS — S8991XA Unspecified injury of right lower leg, initial encounter: Secondary | ICD-10-CM | POA: Diagnosis not present

## 2016-02-27 DIAGNOSIS — M25561 Pain in right knee: Secondary | ICD-10-CM | POA: Diagnosis not present

## 2016-02-27 MED ORDER — HYDROCODONE-ACETAMINOPHEN 5-325 MG PO TABS: 1.0000 | ORAL_TABLET | Freq: Four times a day (QID) | ORAL | Status: DC | PRN
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
## 2016-02-27 NOTE — Discharge Instructions (Signed)
You have bruising of your knee. Apply ice as often as you can. Wear the knee immobilizer when you are moving around for the next week. Make sure you remove this to move your knee several times a day. Use the hydrocodone every 4-6 hours as needed for pain. This will gradually improve over the next 2 weeks. If it is not getting better, please follow-up with Dr. Linna CapriceSwinteck in orthopedics.

## 2016-03-05 DIAGNOSIS — Z113 Encounter for screening for infections with a predominantly sexual mode of transmission: Secondary | ICD-10-CM | POA: Diagnosis not present

## 2016-03-05 DIAGNOSIS — Z3009 Encounter for other general counseling and advice on contraception: Secondary | ICD-10-CM | POA: Diagnosis not present

## 2016-03-05 DIAGNOSIS — Z01419 Encounter for gynecological examination (general) (routine) without abnormal findings: Secondary | ICD-10-CM | POA: Diagnosis not present

## 2016-03-05 DIAGNOSIS — N760 Acute vaginitis: Secondary | ICD-10-CM | POA: Diagnosis not present

## 2016-03-05 DIAGNOSIS — Z3042 Encounter for surveillance of injectable contraceptive: Secondary | ICD-10-CM | POA: Diagnosis not present

## 2016-05-03 ENCOUNTER — Emergency Department (HOSPITAL_COMMUNITY)
Admission: EM | Admit: 2016-05-03 | Discharge: 2016-05-03 | Disposition: A | Discharge status: Home / Self Care | Payer: Medicare Other | Attending: Emergency Medicine | Admitting: Emergency Medicine

## 2016-05-03 ENCOUNTER — Encounter (HOSPITAL_COMMUNITY): Payer: Self-pay | Admitting: Emergency Medicine

## 2016-05-03 DIAGNOSIS — J45909 Unspecified asthma, uncomplicated: Secondary | ICD-10-CM | POA: Diagnosis not present

## 2016-05-03 DIAGNOSIS — J069 Acute upper respiratory infection, unspecified: Secondary | ICD-10-CM | POA: Diagnosis not present

## 2016-05-03 DIAGNOSIS — F17210 Nicotine dependence, cigarettes, uncomplicated: Secondary | ICD-10-CM | POA: Insufficient documentation

## 2016-05-03 DIAGNOSIS — R51 Headache: Secondary | ICD-10-CM | POA: Diagnosis present

## 2016-05-03 MED ORDER — FLUTICASONE PROPIONATE 50 MCG/ACT NA SUSP: 2.0000 | Freq: Every day | NASAL | Status: DC

## 2016-05-03 MED ORDER — PSEUDOEPHEDRINE-GUAIFENESIN ER 60-600 MG PO TB12
ORAL_TABLET | ORAL | Status: DC
Start: 2016-05-03 — End: 2016-10-26

## 2016-05-03 NOTE — ED Provider Notes (Signed)
CSN: [id number]     Arrival date & time 05/03/16  1943 History  By signing my name below, I, Bridgette HabermannMaria Tan, attest that this [id number] has been prepared under the direction and in the presence of General MillsBenjamin Sahar Ryback, PA-C. Electronically Signed: Bridgette HabermannMaria Tan, ED Scribe. 05/03/2016. 10:31 PM.   Chief Complaint  Patient presents with  . Headache  . Allergies   The history is provided by the patient. No language interpreter was used.   HPI Comments: Stacy Blackwell is a 35 y.o. female who presents to the Emergency Department complaining of Gradual onset, constant congestion onset one week ago. Pt also has associated pressure-like headaches, watery eyes with redness, ear pain, dizziness, and runny nose. Pt has tried Visine eyedrops, Zyrtec, and Benadryl with no relief to the congestion. Pt has also tried Ibuprofen and Tylenol with no relief to the headache. Pt has had sinus infections before and notes that this feels very similar. She denies fever, cough, sore throat, and chills.   Past Medical History  Diagnosis Date  . Asthma   . Vertigo   . Acid reflux   . Obesity   . Ovarian cyst   . Depression     doing good, no meds   Past Surgical History  Procedure Laterality Date  . Cyst excision Left 1987   Family History  Problem Relation Age of Onset  . Asthma Mother   . Diabetes Mother   . Heart disease Mother     CHF  . Asthma Sister   . Diabetes Sister   . Heart disease Sister   . Hearing loss Neg Hx    Social History  Substance Use Topics  . Smoking status: Current Every Day Smoker -- 0.25 packs/day for 10 years    Types: Cigarettes  . Smokeless tobacco: Never Used  . Alcohol Use: No   OB History    Gravida Para Term Preterm AB TAB SAB Ectopic Multiple Living   2 2 2  0 0 0 0 0 0 2     Review of Systems A complete 10 system review of systems was obtained and all systems are negative except as noted in the HPI and PMH.   Allergies  Amoxicillin  Home Medications    Prior to Admission medications   Medication Sig Start Date End Date Taking? Authorizing Provider  albuterol (PROVENTIL HFA;VENTOLIN HFA) 108 (90 BASE) MCG/ACT inhaler Inhale 2 puffs into the lungs every 6 (six) hours as needed for wheezing or shortness of breath. 08/02/14   Marny LowensteinJulie N Wenzel, PA-C  albuterol (PROVENTIL) (5 MG/ML) 0.5% nebulizer solution Take 2.5 mg by nebulization every 6 (six) hours as needed for wheezing or shortness of breath.    Historical Provider, MD  budesonide-formoterol (SYMBICORT) 160-4.5 MCG/ACT inhaler Inhale 2 puffs into the lungs 2 (two) times daily. 03/15/15   Leslye Peerobert S Byrum, MD  cetirizine (ZYRTEC) 10 MG tablet Take 1 tablet (10 mg total) by mouth daily. 03/15/15   Leslye Peerobert S Byrum, MD  fluticasone (FLONASE) 50 MCG/ACT nasal spray Place 2 sprays into both nostrils daily. 05/03/16   Joycie PeekBenjamin Kamile Fassler, PA-C  HYDROcodone-acetaminophen (NORCO) 5-325 MG tablet Take 1 tablet by mouth every 6 (six) hours as needed for moderate pain. 02/27/16   Charm RingsErin J Honig, MD  medroxyPROGESTERone (DEPO-PROVERA) 150 MG/ML injection Inject 1 mL (150 mg total) into the muscle every 3 (three) months. Patient taking differently: Inject 150 mg into the muscle every 3 (three) months. Pt received last dose on 02/24/15 - next dose  due 05/27/15 01/15/15   Montez Morita, CNM  naproxen (NAPROSYN) 500 MG tablet Take 1 tablet (500 mg total) by mouth 2 (two) times daily with a meal. 03/24/15   Toy Cookey, MD  pseudoephedrine-guaifenesin [facility] D) 60-600 MG 12 hr tablet 1 tab every 12 hrs 05/03/16   Joycie Peek, PA-C   BP 153/94 mmHg  Pulse 88  Temp(Src) 98.9 F (37.2 C) (Oral)  Resp 20  SpO2 100%  Breastfeeding? No Physical Exam  Constitutional: She is oriented to person, place, and time. She appears well-developed and well-nourished. No distress.  HENT:  Head: Normocephalic and atraumatic.  Mouth/Throat: Oropharynx is clear and moist.  No maxillary or frontal sinus tenderness, erythema. TMs normal  bilaterally.  Eyes: EOM are normal. Pupils are equal, round, and reactive to light. Right eye exhibits no discharge. Left eye exhibits no discharge. No scleral icterus.  Mild, bilateral conjunctival injection. Allergic shiners. No discharge  Neck: Normal range of motion. Neck supple.  No meningismus or nuchal rigidity  Cardiovascular: Normal rate, regular rhythm and normal heart sounds.   Pulmonary/Chest: Effort normal and breath sounds normal. No stridor. No respiratory distress. She has no wheezes. She has no rales.  Abdominal: Soft. She exhibits no distension. There is no tenderness.  Musculoskeletal: Normal range of motion. She exhibits no tenderness.  Neurological: She is alert and oriented to person, place, and time.  Cranial Nerves II-XII grossly intact  Skin: Skin is warm and dry. No rash noted.  Psychiatric: She has a normal mood and affect. Her behavior is normal.  Nursing note and vitals reviewed.   ED Course  Procedures  DIAGNOSTIC STUDIES: Oxygen Saturation is 100% on RA, normal by my interpretation.    COORDINATION OF CARE: 10:27 PM Discussed treatment plan with pt at bedside which includes Rx and PCP follow-up and pt agreed to plan.   MDM  Patients symptoms are consistent with URI, likely viral etiology. Discussed that antibiotics are not indicated for viral infections. Pt will be discharged with symptomatic treatment.  Verbalizes understanding and is agreeable with plan. Pt is hemodynamically stable & in NAD prior to dc.  Final diagnoses:  URI (upper respiratory infection)    I personally performed the services described in this documentation, which was scribed in my presence. The recorded information has been reviewed and is accurate.    Joycie Peek, PA-C 05/03/16 1610  Bethann Berkshire, MD 05/04/16 [phone]

## 2016-05-03 NOTE — Discharge Instructions (Signed)
Please take your medications as prescribed and as we discussed. Follow up with your doctor next week for reevaluation. Return to ED for any new or worsening symptoms.  Upper Respiratory Infection, Adult Most upper respiratory infections (URIs) are a viral infection of the air passages leading to the lungs. A URI affects the nose, throat, and upper air passages. The most common type of URI is nasopharyngitis and is typically referred to as "the common cold." URIs run their course and usually go away on their own. Most of the time, a URI does not require medical attention, but sometimes a bacterial infection in the upper airways can follow a viral infection. This is called a secondary infection. Sinus and middle ear infections are common types of secondary upper respiratory infections. Bacterial pneumonia can also complicate a URI. A URI can worsen asthma and chronic obstructive pulmonary disease (COPD). Sometimes, these complications can require emergency medical care and may be life threatening.  CAUSES Almost all URIs are caused by viruses. A virus is a type of germ and can spread from one person to another.  RISKS FACTORS You may be at risk for a URI if:   You smoke.   You have chronic heart or lung disease.  You have a weakened defense (immune) system.   You are very young or very old.   You have nasal allergies or asthma.  You work in crowded or poorly ventilated areas.  You work in health care facilities or schools. SIGNS AND SYMPTOMS  Symptoms typically develop 2-3 days after you come in contact with a cold virus. Most viral URIs last 7-10 days. However, viral URIs from the influenza virus (flu virus) can last 14-18 days and are typically more severe. Symptoms may include:   Runny or stuffy (congested) nose.   Sneezing.   Cough.   Sore throat.   Headache.   Fatigue.   Fever.   Loss of appetite.   Pain in your forehead, behind your eyes, and over your  cheekbones (sinus pain).  Muscle aches.  DIAGNOSIS  Your health care provider may diagnose a URI by:  Physical exam.  Tests to check that your symptoms are not due to another condition such as:  Strep throat.  Sinusitis.  Pneumonia.  Asthma. TREATMENT  A URI goes away on its own with time. It cannot be cured with medicines, but medicines may be prescribed or recommended to relieve symptoms. Medicines may help:  Reduce your fever.  Reduce your cough.  Relieve nasal congestion. HOME CARE INSTRUCTIONS   Take medicines only as directed by your health care provider.   Gargle warm saltwater or take cough drops to comfort your throat as directed by your health care provider.  Use a warm mist humidifier or inhale steam from a shower to increase air moisture. This may make it easier to breathe.  Drink enough fluid to keep your urine clear or pale yellow.   Eat soups and other clear broths and maintain good nutrition.   Rest as needed.   Return to work when your temperature has returned to normal or as your health care provider advises. You may need to stay home longer to avoid infecting others. You can also use a face mask and careful hand washing to prevent spread of the virus.  Increase the usage of your inhaler if you have asthma.   Do not use any tobacco products, including cigarettes, chewing tobacco, or electronic cigarettes. If you need help quitting, ask your health care  provider. PREVENTION  The best way to protect yourself from getting a cold is to practice good hygiene.   Avoid oral or hand contact with people with cold symptoms.   Wash your hands often if contact occurs.  There is no clear evidence that vitamin C, vitamin E, echinacea, or exercise reduces the chance of developing a cold. However, it is always recommended to get plenty of rest, exercise, and practice good nutrition.  SEEK MEDICAL CARE IF:   You are getting worse rather than better.    Your symptoms are not controlled by medicine.   You have chills.  You have worsening shortness of breath.  You have brown or red mucus.  You have yellow or brown nasal discharge.  You have pain in your face, especially when you bend forward.  You have a fever.  You have swollen neck glands.  You have pain while swallowing.  You have white areas in the back of your throat. SEEK IMMEDIATE MEDICAL CARE IF:   You have severe or persistent:  Headache.  Ear pain.  Sinus pain.  Chest pain.  You have chronic lung disease and any of the following:  Wheezing.  Prolonged cough.  Coughing up blood.  A change in your usual mucus.  You have a stiff neck.  You have changes in your:  Vision.  Hearing.  Thinking.  Mood. MAKE SURE YOU:   Understand these instructions.  Will watch your condition.  Will get help right away if you are not doing well or get worse.   This information is not intended to replace advice given to you by your health care provider. Make sure you discuss any questions you have with your health care provider.   Document Released: 04/02/2001 Document Revised: 02/21/2015 Document Reviewed: 01/12/2014 Elsevier Interactive Patient Education [employer].

## 2016-05-03 NOTE — ED Notes (Addendum)
PA-C to see and assess patient before RN assessment. See PA note. 

## 2016-05-03 NOTE — ED Notes (Signed)
Patient verbalized understanding of discharge instructions and denies any further needs or questions at this time. VS stable. Patient ambulatory with steady gait, escorted to ED entrance.   

## 2016-05-03 NOTE — ED Notes (Signed)
Pt states since last Friday she has been having sinus pressure in her face and feeling like "my head is full and when i wake up i have a lot of nasal drainage". pt also reports watery eyes with redness. Pt has been using visine drops and took a benadryl today with no relief.

## 2016-05-27 ENCOUNTER — Encounter (HOSPITAL_COMMUNITY): Payer: Self-pay | Admitting: Emergency Medicine

## 2016-05-27 ENCOUNTER — Ambulatory Visit (HOSPITAL_COMMUNITY)
Admission: EM | Admit: 2016-05-27 | Discharge: 2016-05-27 | Disposition: A | Discharge status: Home / Self Care | Payer: Medicare Other | Attending: Family Medicine | Admitting: Family Medicine

## 2016-05-27 DIAGNOSIS — J069 Acute upper respiratory infection, unspecified: Secondary | ICD-10-CM

## 2016-05-27 DIAGNOSIS — H1013 Acute atopic conjunctivitis, bilateral: Secondary | ICD-10-CM | POA: Diagnosis not present

## 2016-05-27 DIAGNOSIS — J309 Allergic rhinitis, unspecified: Secondary | ICD-10-CM

## 2016-05-27 MED ORDER — OXYMETAZOLINE HCL 0.05 % NA SOLN: 1.0000 | Freq: Two times a day (BID) | NASAL | 0 refills | Status: DC

## 2016-05-27 MED ORDER — OLOPATADINE HCL 0.2 % OP SOLN: OPHTHALMIC | 0 refills | Status: DC

## 2016-05-27 NOTE — ED Triage Notes (Signed)
Patient reports headache and blurry vision for a month.  Reports being seen in the ed for the same and prescribed sudafed and flonase.  Since then reports no improvement, eyes with drainage, eyes blood shot.

## 2016-05-27 NOTE — ED Provider Notes (Signed)
[facility]    CSN: [id number] Arrival date & time: 05/27/16  1214  First Provider Contact:  First MD Initiated Contact with Patient 05/27/16 1256     History   Chief Complaint Chief Complaint  Patient presents with  . Headache   HPI Stacy Blackwell is a 35 y.o. female presenting for red eyes with discharge and headache. She reports a month of worsening headache that is intermittent described as moderate-severe bilateral, nonradiating, "pressure" in addition to ear fullness. She was seen in the ED, told to take sudafed and other symptomatic management for viral URI/sinus infection. She reports no improvement in symptoms and over the last week the addition of red eyes with watery discharge and AM crusting. No FB sensation or eye pain. Visine has not helped. No fevers, runny nose, sore throat, cough, trouble breathing, chest pain, palpitations. + ear fullness without dizziness. + nasal congestion for which she's continued taking zyrtec and flonase with little relief.   HPI  Past Medical History:  Diagnosis Date  . Acid reflux   . Asthma   . Depression    doing good, no meds  . Obesity   . Ovarian cyst   . Vertigo     Patient Active Problem List   Diagnosis Date Noted  . NSVD (normal spontaneous vaginal delivery) 01/14/2015  . Premature rupture of membranes 01/13/2015  . Active labor at term 01/13/2015  . Polyhydramnios in third trimester   . [redacted] weeks gestation of pregnancy   . Polyhydramnios in third trimester, antepartum   . [redacted] weeks gestation of pregnancy   . Large for dates   . [redacted] weeks gestation of pregnancy   . Abdominal pain in pregnancy, antepartum   . [redacted] weeks gestation of pregnancy   . Evaluate anatomy not seen on prior sonogram   . Obesity in pregnancy with antepartum complication   . [redacted] weeks gestation of pregnancy   . Encounter for fetal anatomic survey   . [redacted] weeks gestation of pregnancy   . 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 Allergic rhinitis  07/01/2014  . GERD (gastroesophageal reflux disease) 07/01/2014  . Tobacco use disorder 07/01/2014    Past Surgical History:  Procedure Laterality Date  . CYST EXCISION Left 1987    OB History    Gravida Para Term Preterm AB Living   2 2 2  0 0 2   SAB TAB Ectopic Multiple Live Births   0 0 0 0 2       Home Medications    Prior to Admission medications   Medication Sig Start Date End Date Taking? Authorizing Provider  cetirizine (ZYRTEC) 10 MG tablet Take 1 tablet (10 mg total) by mouth daily. 03/15/15  Yes Leslye Peer, MD  fluticasone (FLONASE) 50 MCG/ACT nasal spray Place 2 sprays into both nostrils daily. 05/03/16  Yes Benjamin Cartner, PA-C  pseudoephedrine (SUDAFED) 30 MG tablet Take 30 mg by mouth every 4 (four) hours as needed for congestion.   Yes Historical Provider, MD  albuterol (PROVENTIL HFA;VENTOLIN HFA) 108 (90 BASE) MCG/ACT inhaler Inhale 2 puffs into the lungs every 6 (six) hours as needed for wheezing or shortness of breath. 08/02/14   Marny Lowenstein, PA-C  albuterol (PROVENTIL) (5 MG/ML) 0.5% nebulizer solution Take 2.5 mg by nebulization every 6 (six) hours as needed for wheezing or shortness of breath.    Historical Provider, MD  budesonide-formoterol (SYMBICORT) 160-4.5 MCG/ACT inhaler Inhale 2 puffs into the lungs 2 (two) times daily. 03/15/15  Leslye Peer, MD  HYDROcodone-acetaminophen (NORCO) 5-325 MG tablet Take 1 tablet by mouth every 6 (six) hours as needed for moderate pain. 02/27/16   Charm Rings, MD  medroxyPROGESTERone (DEPO-PROVERA) 150 MG/ML injection Inject 1 mL (150 mg total) into the muscle every 3 (three) months. Patient taking differently: Inject 150 mg into the muscle every 3 (three) months. Pt received last dose on 02/24/15 - next dose due 05/27/15 01/15/15   Montez Morita, CNM  naproxen (NAPROSYN) 500 MG tablet Take 1 tablet (500 mg total) by mouth 2 (two) times daily with a meal. 03/24/15   Toy Cookey, MD  Olopatadine HCl (PATADAY) 0.2 %  SOLN 1 drop into bilateral eyes once daily as needed for redness 05/27/16   Tyrone Nine, MD  oxymetazoline (AFRIN NASAL SPRAY) 0.05 % nasal spray Place 1 spray into both nostrils 2 (two) times daily. for no more than 3 days 05/27/16   Tyrone Nine, MD  pseudoephedrine-guaifenesin [facility] D) 60-600 MG 12 hr tablet 1 tab every 12 hrs 05/03/16   Joycie Peek, PA-C    Family History Family History  Problem Relation Age of Onset  . Asthma Mother   . Diabetes Mother   . Heart disease Mother     CHF  . Asthma Sister   . Diabetes Sister   . Heart disease Sister   . Hearing loss Neg Hx     Social History Social History  Substance Use Topics  . Smoking status: Current Every Day Smoker    Packs/day: 0.25    Years: 10.00    Types: Cigarettes  . Smokeless tobacco: Never Used  . Alcohol use No     Allergies   Amoxicillin   Review of Systems Review of Systems As above  Physical Exam Triage Vital Signs ED Triage Vitals  Enc Vitals Group     BP 05/27/16 1239 121/70     Pulse Rate 05/27/16 1239 88     Resp --      Temp 05/27/16 1239 98.6 F (37 C)     Temp Source 05/27/16 1239 Oral     SpO2 05/27/16 1239 100 %     Weight --      Height --      Head Circumference --      Peak Flow --      Pain Score 05/27/16 1324 7     Pain Loc --      Pain Edu? --      Excl. in GC? --    No data found.   Updated Vital Signs BP 121/70 (BP Location: Left Arm)   Pulse 88   Temp 98.6 F (37 C) (Oral)   SpO2 100%   Visual Acuity Right Eye Distance:   Left Eye Distance:   Bilateral Distance:    Right Eye Near:   Left Eye Near:    Bilateral Near:     Physical Exam  Constitutional: She is oriented to person, place, and time. She appears well-developed and well-nourished. No distress.  HENT:  Ear canals and TMs wnl bilaterally with clear effusions bilaterally. Boggy turbinates without discharge. Oropharynx clear with mild tonsil enlargement.   Eyes: EOM are normal. Pupils are  equal, round, and reactive to light. No scleral icterus.  Cornea without defect, pupils briskly reactive bilaterally, conjuncatival injection with perilimbal sparing bilaterally with watery discharge. No FB noted. EOMI.   Neck: Normal range of motion. Neck supple. No JVD present.  Cardiovascular: Normal rate,  regular rhythm, normal heart sounds and intact distal pulses.   No murmur heard. Pulmonary/Chest: Effort normal. No respiratory distress. She has wheezes (single end expiratory wheeze heard without prolonged expiration or increased rate).  Abdominal: Soft. Bowel sounds are normal. She exhibits no distension. There is no tenderness.  Musculoskeletal: Normal range of motion. She exhibits no edema or tenderness.  Lymphadenopathy:    She has no cervical adenopathy.  Neurological: She is alert and oriented to person, place, and time. She exhibits normal muscle tone.  Skin: Skin is warm and dry. Capillary refill takes less than 2 seconds. No rash noted.  Vitals reviewed.  UC Treatments / Results  Labs (all labs ordered are listed, but only abnormal results are displayed) Labs Reviewed - No data to display  EKG  EKG Interpretation None       Radiology No results found.  Procedures Procedures (including critical care time)  Medications Ordered in UC Medications - No data to display   Initial Impression / Assessment and Plan / UC Course  I have reviewed the triage vital signs and the nursing notes.  Pertinent labs & imaging results that were available during my care of the patient were reviewed by me and considered in my medical decision making (see chart for details).  Final Clinical Impressions(s) / UC Diagnoses   Final diagnoses:  URI (upper respiratory infection)  Allergic conjunctivitis and rhinitis, bilateral   35 y.o. female with a history of allergic rhinitis and asthma presenting for ongoing headache and congestion due to viral URI vs. flare of allergies with  conjunctivitis.  - I do not believe abx are indicated in absence of fever, purulent discharge/exudates.  - Continue po antihistamine and IN steroid - Continue albuterol MDI prn.  - Short trial of afrin to relieve congestion and head pressure - Pataday drops  Plan of care discussed with patient who is in agreement to return if symptoms suddenly worsen. She is in no distress with stable vital signs.   New Prescriptions Discharge Medication List as of 05/27/2016  1:45 PM    START taking these medications   Details  Olopatadine HCl (PATADAY) 0.2 % SOLN 1 drop into bilateral eyes once daily as needed for redness, Print    oxymetazoline (AFRIN NASAL SPRAY) 0.05 % nasal spray Place 1 spray into both nostrils 2 (two) times daily. for no more than 3 days, Starting Mon 05/27/2016, Print         Tyrone Nineyan B Andersen Iorio, MD 05/27/16 [phone]

## 2016-06-05 DIAGNOSIS — Z3042 Encounter for surveillance of injectable contraceptive: Secondary | ICD-10-CM | POA: Diagnosis not present

## 2016-06-05 DIAGNOSIS — Z3009 Encounter for other general counseling and advice on contraception: Secondary | ICD-10-CM | POA: Diagnosis not present

## 2016-07-29 ENCOUNTER — Encounter (HOSPITAL_COMMUNITY): Payer: Self-pay | Admitting: Emergency Medicine

## 2016-07-29 DIAGNOSIS — J069 Acute upper respiratory infection, unspecified: Secondary | ICD-10-CM | POA: Diagnosis not present

## 2016-07-29 DIAGNOSIS — R079 Chest pain, unspecified: Secondary | ICD-10-CM | POA: Diagnosis not present

## 2016-07-29 DIAGNOSIS — J40 Bronchitis, not specified as acute or chronic: Secondary | ICD-10-CM | POA: Diagnosis not present

## 2016-07-29 DIAGNOSIS — R0602 Shortness of breath: Secondary | ICD-10-CM | POA: Diagnosis present

## 2016-07-29 DIAGNOSIS — F17210 Nicotine dependence, cigarettes, uncomplicated: Secondary | ICD-10-CM | POA: Insufficient documentation

## 2016-07-29 MED ORDER — ALBUTEROL SULFATE (2.5 MG/3ML) 0.083% IN NEBU
5.0000 mg | INHALATION_SOLUTION | Freq: Once | RESPIRATORY_TRACT | Status: AC
  Administered 2016-07-29: 5 mg via RESPIRATORY_TRACT

## 2016-07-29 MED ORDER — IPRATROPIUM-ALBUTEROL 0.5-2.5 (3) MG/3ML IN SOLN
RESPIRATORY_TRACT | Status: AC
  Filled 2016-07-29: qty 3

## 2016-07-29 MED ORDER — ALBUTEROL SULFATE (2.5 MG/3ML) 0.083% IN NEBU
INHALATION_SOLUTION | RESPIRATORY_TRACT | Status: AC
  Filled 2016-07-29: qty 6

## 2016-07-29 NOTE — ED Triage Notes (Signed)
Pt presents to Ed for assessment of left sided CP started approx 8pm tonight.  Pt c/o cough with increased wheezing and SOB x  2 days.  Hx of Asthma.  Pt has increased nebulizer treatments and inhaler usage at home x 1 day as well.

## 2016-07-30 ENCOUNTER — Emergency Department (HOSPITAL_COMMUNITY): Payer: Medicare Other

## 2016-07-30 ENCOUNTER — Emergency Department (HOSPITAL_COMMUNITY)
Admission: EM | Admit: 2016-07-30 | Discharge: 2016-07-30 | Disposition: A | Discharge status: Home / Self Care | Payer: Medicare Other | Attending: Emergency Medicine | Admitting: Emergency Medicine

## 2016-07-30 DIAGNOSIS — R0602 Shortness of breath: Secondary | ICD-10-CM | POA: Diagnosis not present

## 2016-07-30 DIAGNOSIS — R079 Chest pain, unspecified: Secondary | ICD-10-CM | POA: Diagnosis not present

## 2016-07-30 DIAGNOSIS — J40 Bronchitis, not specified as acute or chronic: Secondary | ICD-10-CM

## 2016-07-30 DIAGNOSIS — J069 Acute upper respiratory infection, unspecified: Secondary | ICD-10-CM

## 2016-07-30 LAB — BASIC METABOLIC PANEL
Anion gap: 11 (ref 5–15)
BUN: 7 mg/dL (ref 6–20)
CALCIUM: 8.8 mg/dL — AB (ref 8.9–10.3)
CO2: 19 mmol/L — ABNORMAL LOW (ref 22–32)
Chloride: 106 mmol/L (ref 101–111)
Creatinine, Ser: 0.89 mg/dL (ref 0.44–1.00)
Glucose, Bld: 105 mg/dL — ABNORMAL HIGH (ref 65–99)
Potassium: 3.8 mmol/L (ref 3.5–5.1)
Sodium: 136 mmol/L (ref 135–145)

## 2016-07-30 LAB — CBC
HCT: 39.6 % (ref 36.0–46.0)
HEMOGLOBIN: 13 g/dL (ref 12.0–15.0)
MCH: 28.3 pg (ref 26.0–34.0)
MCHC: 32.8 g/dL (ref 30.0–36.0)
MCV: 86.3 fL (ref 78.0–100.0)
Platelets: 236 10*3/uL (ref 150–400)
RBC: 4.59 MIL/uL (ref 3.87–5.11)
RDW: 14.1 % (ref 11.5–15.5)
WBC: 6.3 10*3/uL (ref 4.0–10.5)

## 2016-07-30 LAB — I-STAT TROPONIN, ED: TROPONIN I, POC: 0 ng/mL (ref 0.00–0.08)

## 2016-07-30 MED ORDER — FEXOFENADINE HCL 60 MG PO TABS: 60.0000 mg | ORAL_TABLET | Freq: Two times a day (BID) | ORAL | 0 refills | Status: DC

## 2016-07-30 MED ORDER — PREDNISONE 10 MG PO TABS: 60.0000 mg | ORAL_TABLET | Freq: Every day | ORAL | 0 refills | Status: DC

## 2016-07-30 NOTE — ED Provider Notes (Signed)
[facility]-EMERGENCY DEPT Provider Note   CSN: [id number] Arrival date & time: 07/29/16  2346   By signing my name below, I, Clovis Pu, attest that this documentation has been prepared under the direction and in the presence of Derwood Kaplan, MD  Electronically Signed: Clovis Pu, ED Scribe. 07/30/16. 3:22 AM.   History   Chief Complaint Chief Complaint  Patient presents with  . Chest Pain  . Shortness of Breath  . Asthma    The history is provided by the patient. No language interpreter was used.   HPI Comments:  Stacy Blackwell is a 35 y.o. female, with a hx of asthma,  who presents to the Emergency Department complaining of intermittent episodes of "sharp" left sided chest pain which began at 8 PM 1 day ago. Pt notes her pain lasts for about 3 minutes over the course of 2 hours, but has since resolved. She states her pain is non-radiating. Associated symptoms include SOB and a productive cough with greenish-brown phlegm x 1 week. Pt denies a hx of cardiac problems and nausea. She is a smoker. Pt denies any other complaints at this time.  Pt has no hx of PE, DVT and denies any exogenous estrogen use, long distance travels or surgery in the past 6 weeks, active cancer, recent immobilization.  Past Medical History:  Diagnosis Date  . Acid reflux   . Asthma   . Depression    doing good, no meds  . Obesity   . Ovarian cyst   . Vertigo     Patient Active Problem List   Diagnosis Date Noted  . NSVD (normal spontaneous vaginal delivery) 01/14/2015  . Premature rupture of membranes 01/13/2015  . Active labor at term 01/13/2015  . Polyhydramnios in third trimester   . [redacted] weeks gestation of pregnancy   . Polyhydramnios in third trimester, antepartum   . [redacted] weeks gestation of pregnancy   . Large for dates   . [redacted] weeks gestation of pregnancy   . Abdominal pain in pregnancy, antepartum   . [redacted] weeks gestation of pregnancy   . Evaluate anatomy not seen on prior sonogram   .  Obesity in pregnancy with antepartum complication   . [redacted] weeks gestation of pregnancy   . Encounter for fetal anatomic survey   . [redacted] weeks gestation of pregnancy   . 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 Allergic rhinitis 07/01/2014  . GERD (gastroesophageal reflux disease) 07/01/2014  . Tobacco use disorder 07/01/2014    Past Surgical History:  Procedure Laterality Date  . CYST EXCISION Left 1987    OB History    Gravida Para Term Preterm AB Living   2 2 2  0 0 2   SAB TAB Ectopic Multiple Live Births   0 0 0 0 2       Home Medications    Prior to Admission medications   Medication Sig Start Date End Date Taking? Authorizing Provider  albuterol (PROVENTIL HFA;VENTOLIN HFA) 108 (90 BASE) MCG/ACT inhaler Inhale 2 puffs into the lungs every 6 (six) hours as needed for wheezing or shortness of breath. 08/02/14   Marny Lowenstein, PA-C  albuterol (PROVENTIL) (5 MG/ML) 0.5% nebulizer solution Take 2.5 mg by nebulization every 6 (six) hours as needed for wheezing or shortness of breath.    Historical Provider, MD  budesonide-formoterol (SYMBICORT) 160-4.5 MCG/ACT inhaler Inhale 2 puffs into the lungs 2 (two) times daily. 03/15/15   Leslye Peer, MD  cetirizine (ZYRTEC) 10 MG tablet Take  1 tablet (10 mg total) by mouth daily. 03/15/15   Leslye Peerobert S Byrum, MD  fexofenadine (ALLEGRA) 60 MG tablet Take 1 tablet (60 mg total) by mouth 2 (two) times daily. 07/30/16   Derwood KaplanAnkit Reginald Weida, MD  fluticasone (FLONASE) 50 MCG/ACT nasal spray Place 2 sprays into both nostrils daily. 05/03/16   Joycie PeekBenjamin Cartner, PA-C  HYDROcodone-acetaminophen (NORCO) 5-325 MG tablet Take 1 tablet by mouth every 6 (six) hours as needed for moderate pain. 02/27/16   Charm RingsErin J Honig, MD  medroxyPROGESTERone (DEPO-PROVERA) 150 MG/ML injection Inject 1 mL (150 mg total) into the muscle every 3 (three) months. Patient taking differently: Inject 150 mg into the muscle every 3 (three) months. Pt received last dose on 02/24/15 - next dose due 05/27/15  01/15/15   Montez MoritaMarie D Lawson, CNM  naproxen (NAPROSYN) 500 MG tablet Take 1 tablet (500 mg total) by mouth 2 (two) times daily with a meal. 03/24/15   Toy CookeyMegan Docherty, MD  Olopatadine HCl (PATADAY) 0.2 % SOLN 1 drop into bilateral eyes once daily as needed for redness 05/27/16   Tyrone Nineyan B Grunz, MD  oxymetazoline (AFRIN NASAL SPRAY) 0.05 % nasal spray Place 1 spray into both nostrils 2 (two) times daily. for no more than 3 days 05/27/16   Tyrone Nineyan B Grunz, MD  predniSONE (DELTASONE) 10 MG tablet Take 6 tablets (60 mg total) by mouth daily. 07/30/16   Derwood KaplanAnkit Nickcole Bralley, MD  pseudoephedrine (SUDAFED) 30 MG tablet Take 30 mg by mouth every 4 (four) hours as needed for congestion.    Historical Provider, MD  pseudoephedrine-guaifenesin [facility](MUCINEX D) 60-600 MG 12 hr tablet 1 tab every 12 hrs 05/03/16   Joycie PeekBenjamin Cartner, PA-C    Family History Family History  Problem Relation Age of Onset  . Asthma Mother   . Diabetes Mother   . Heart disease Mother     CHF  . Asthma Sister   . Diabetes Sister   . Heart disease Sister   . Hearing loss Neg Hx     Social History Social History  Substance Use Topics  . Smoking status: Current Every Day Smoker    Packs/day: 0.25    Years: 10.00    Types: Cigarettes  . Smokeless tobacco: Never Used  . Alcohol use No     Allergies   Amoxicillin   Review of Systems Review of Systems  Respiratory: Positive for cough and shortness of breath.   Cardiovascular: Positive for chest pain.  Gastrointestinal: Negative for nausea.   10 Systems reviewed and are negative for acute change except as noted in the HPI.   Physical Exam Updated Vital Signs BP 138/82   Pulse 92   Temp 98.9 F (37.2 C) (Oral)   Resp 20   Ht 5\' 1"  (1.549 m)   Wt 280 lb (127 kg)   SpO2 100%   BMI 52.91 kg/m   Physical Exam  Constitutional: She is oriented to person, place, and time. She appears well-developed and well-nourished.  HENT:  Head: Normocephalic.  Eyes: EOM are normal.  Neck: Normal  range of motion.  Cardiovascular: Normal rate and regular rhythm.   Pulmonary/Chest: Effort normal.  L sided rhonchus breath sounds at the base. No crackles no rales.   Abdominal: She exhibits no distension.  Musculoskeletal: Normal range of motion.  Neurological: She is alert and oriented to person, place, and time.  Psychiatric: She has a normal mood and affect.  Nursing note and vitals reviewed.    ED Treatments / Results  DIAGNOSTIC  STUDIES:  Oxygen Saturation is 100% on RA, normal by my interpretation.    COORDINATION OF CARE:  3:19 AM Discussed treatment plan with pt at bedside and pt agreed to plan.  Labs (all labs ordered are listed, but only abnormal results are displayed) Labs Reviewed  BASIC METABOLIC PANEL - Abnormal; Notable for the following:       Result Value   CO2 19 (*)    Glucose, Bld 105 (*)    Calcium 8.8 (*)    All other components within normal limits  CBC  I-STAT TROPOININ, ED    EKG  EKG Interpretation  Date/Time:  Monday July 29 2016 23:51:04 EDT Ventricular Rate:  98 PR Interval:  134 QRS Duration: 74 QT Interval:  336 QTC Calculation: 428 R Axis:   38 Text Interpretation:  Normal sinus rhythm Nonspecific T wave abnormality  v3-v6 is new Abnormal ECG No acute changes Confirmed by Rhunette Croft, MD, Janey Genta [phone]) on 07/30/2016 2:54:04 AM       Radiology Dg Chest 2 View  Result Date: 07/30/2016 CLINICAL DATA:  Shortness of breath, chest pain, asthma for 2 days. EXAM: CHEST  2 VIEW COMPARISON:  02/28/2015 FINDINGS: Chronic bronchial and interstitial thickening. No focal airspace disease. Heart size and mediastinal contours are normal. No pleural fluid or pneumothorax. No pulmonary edema. Unchanged osseous structures. IMPRESSION: Chronic bronchial and interstitial thickening. No acute abnormality. Electronically Signed   By: Rubye Oaks M.D.   On: 07/30/2016 01:23    Procedures Procedures (including critical care time)  Medications  Ordered in ED Medications  albuterol (PROVENTIL) (2.5 MG/3ML) 0.083% nebulizer solution (not administered)  albuterol (PROVENTIL) (2.5 MG/3ML) 0.083% nebulizer solution 5 mg (5 mg Nebulization Given 07/29/16 2358)     Initial Impression / Assessment and Plan / ED Course  I have reviewed the triage vital signs and the nursing notes.  Pertinent labs & imaging results that were available during my care of the patient were reviewed by me and considered in my medical decision making (see chart for details).  Clinical Course   I personally performed the services described in this documentation, which was scribed in my presence. The recorded information has been reviewed and is accurate.  Pt comes in with cc of chest pain. Chest pain is atypical - EKG is non specific but no signs of clear ischemia, trops are neg. Pt doesn't have hard risk factors for ACS and we dont think she has ACS, no need for further trops. Pt also is having dib, cough, wheezing - likely URI - and CXR is consistent with bronchitis.  Strict ER return precautions have been discussed, and patient is agreeing with the plan and is comfortable with the workup done and the recommendations from the ER.    Final Clinical Impressions(s) / ED Diagnoses   Final diagnoses:  Viral URI  Bronchitis    New Prescriptions New Prescriptions   FEXOFENADINE (ALLEGRA) 60 MG TABLET    Take 1 tablet (60 mg total) by mouth 2 (two) times daily.   PREDNISONE (DELTASONE) 10 MG TABLET    Take 6 tablets (60 mg total) by mouth daily.      Derwood Kaplan, MD 07/30/16 [phone]

## 2016-07-30 NOTE — Discharge Instructions (Signed)
We think what you have is a viral syndrome - the treatment for which is symptomatic relief only, and your body will fight the infection off in a few days. °We are prescribing you some meds. °See your primary care doctor in 1 week if the symptoms dont improve. ° ° °

## 2016-08-27 ENCOUNTER — Encounter (HOSPITAL_COMMUNITY): Payer: Self-pay | Admitting: *Deleted

## 2016-08-27 ENCOUNTER — Ambulatory Visit (HOSPITAL_COMMUNITY)
Admission: EM | Admit: 2016-08-27 | Discharge: 2016-08-27 | Disposition: A | Discharge status: Home / Self Care | Payer: Medicare Other | Attending: Family Medicine | Admitting: Family Medicine

## 2016-08-27 DIAGNOSIS — R109 Unspecified abdominal pain: Secondary | ICD-10-CM | POA: Diagnosis not present

## 2016-08-27 DIAGNOSIS — N3090 Cystitis, unspecified without hematuria: Secondary | ICD-10-CM

## 2016-08-27 DIAGNOSIS — R350 Frequency of micturition: Secondary | ICD-10-CM | POA: Diagnosis not present

## 2016-08-27 DIAGNOSIS — T148XXA Other injury of unspecified body region, initial encounter: Secondary | ICD-10-CM | POA: Diagnosis not present

## 2016-08-27 DIAGNOSIS — M545 Low back pain, unspecified: Secondary | ICD-10-CM

## 2016-08-27 LAB — POCT URINALYSIS DIP (DEVICE)
Bilirubin Urine: NEGATIVE
GLUCOSE, UA: NEGATIVE mg/dL
KETONES UR: NEGATIVE mg/dL
Nitrite: NEGATIVE
PH: 6 (ref 5.0–8.0)
PROTEIN: 100 mg/dL — AB
SPECIFIC GRAVITY, URINE: 1.02 (ref 1.005–1.030)
Urobilinogen, UA: 0.2 mg/dL (ref 0.0–1.0)

## 2016-08-27 LAB — POCT PREGNANCY, URINE: Preg Test, Ur: NEGATIVE

## 2016-08-27 MED ORDER — KETOROLAC TROMETHAMINE 60 MG/2ML IM SOLN
60.0000 mg | Freq: Once | INTRAMUSCULAR | Status: AC
  Administered 2016-08-27: 60 mg via INTRAMUSCULAR

## 2016-08-27 MED ORDER — SULFAMETHOXAZOLE-TRIMETHOPRIM 800-160 MG PO TABS: 1.0000 | ORAL_TABLET | Freq: Two times a day (BID) | ORAL | 0 refills | Status: DC

## 2016-08-27 MED ORDER — KETOROLAC TROMETHAMINE 60 MG/2ML IM SOLN
INTRAMUSCULAR | Status: AC
  Filled 2016-08-27: qty 2

## 2016-08-27 MED ORDER — NAPROXEN 375 MG PO TABS: 375.0000 mg | ORAL_TABLET | Freq: Two times a day (BID) | ORAL | 0 refills | Status: DC

## 2016-08-27 NOTE — ED Triage Notes (Signed)
Pt  Reports   Pain  r  Flank     With    With  Tingling   Sensation     When  Urinating      Vaginal  Bleeding  As   Well     Pt  Ambulated  To  Room  With a  Slow  Gait     Symptoms  Began 2  Days  Ago   Pt  Reports  Has  Urge  To  urinate

## 2016-08-27 NOTE — Discharge Instructions (Signed)
Apply heat to the back. Take medications as directed. Drink plenty of fluids stay well-hydrated. May also take AZO for urinary symptoms. Follow-up with your primary care doctor as needed later this week. Call for appointment. If he developed fevers, increased pain or worsening go to the emergency department.

## 2016-08-27 NOTE — ED Provider Notes (Signed)
CSN: [id number]     Arrival date & time 08/27/16  1456 History   First MD Initiated Contact with Patient 08/27/16 1533     Chief Complaint  Patient presents with  . Flank Pain   (Consider location/radiation/quality/duration/timing/severity/associated sxs/prior Treatment) 35 year old obese female complaining of sharp pain to the right low back, para thoracic musculature for 2 days. Is also complaining of low volume voids, urinary frequency and tingling with urination. She states that there is some pain with urination. It is noted she is a poor historian, frequently does not answer questions until asked to 3 times and states that she does not know the answer to some of the questions in regard to when and where there is pain and what makes it better or worse, body language suggests that movement exacerbates pain but "sometimes" denies it. Responds with "I dont know" much of the time.       Past Medical History:  Diagnosis Date  . Acid reflux   . Asthma   . Depression    doing good, no meds  . Obesity   . Ovarian cyst   . Vertigo    Past Surgical History:  Procedure Laterality Date  . CYST EXCISION Left 1987   Family History  Problem Relation Age of Onset  . Asthma Mother   . Diabetes Mother   . Heart disease Mother     CHF  . Asthma Sister   . Diabetes Sister   . Heart disease Sister   . Hearing loss Neg Hx    Social History  Substance Use Topics  . Smoking status: Current Every Day Smoker    Packs/day: 0.25    Years: 10.00    Types: Cigarettes  . Smokeless tobacco: Never Used  . Alcohol use No   OB History    Gravida Para Term Preterm AB Living   2 2 2  0 0 2   SAB TAB Ectopic Multiple Live Births   0 0 0 0 2     Review of Systems  Constitutional: Positive for activity change. Negative for chills and fever.       Prefers to stand and leaning over the end of the bed or walking for improved comfort.  HENT: Negative.   Respiratory: Negative.   Cardiovascular:  Negative.   Gastrointestinal: Negative.   Genitourinary: Positive for frequency and urgency. Negative for pelvic pain.       As per HPI  Light vaginal spotting  Musculoskeletal: Positive for back pain and myalgias.       As per HPI  Skin: Negative for color change, pallor and rash.  Neurological: Negative.   All other systems reviewed and are negative.   Allergies  Amoxicillin and Hydrocodone  Home Medications   Prior to Admission medications   Medication Sig Start Date End Date Taking? Authorizing Provider  albuterol (PROVENTIL HFA;VENTOLIN HFA) 108 (90 BASE) MCG/ACT inhaler Inhale 2 puffs into the lungs every 6 (six) hours as needed for wheezing or shortness of breath. 08/02/14   Marny Lowenstein, PA-C  albuterol (PROVENTIL) (5 MG/ML) 0.5% nebulizer solution Take 2.5 mg by nebulization every 6 (six) hours as needed for wheezing or shortness of breath.    Historical Provider, MD  budesonide-formoterol (SYMBICORT) 160-4.5 MCG/ACT inhaler Inhale 2 puffs into the lungs 2 (two) times daily. 03/15/15   Leslye Peer, MD  cetirizine (ZYRTEC) 10 MG tablet Take 1 tablet (10 mg total) by mouth daily. 03/15/15   Leslye Peer, MD  fexofenadine (ALLEGRA) 60 MG tablet Take 1 tablet (60 mg total) by mouth 2 (two) times daily. 07/30/16   Derwood KaplanAnkit Nanavati, MD  fluticasone (FLONASE) 50 MCG/ACT nasal spray Place 2 sprays into both nostrils daily. 05/03/16   Joycie PeekBenjamin Cartner, PA-C  HYDROcodone-acetaminophen (NORCO) 5-325 MG tablet Take 1 tablet by mouth every 6 (six) hours as needed for moderate pain. 02/27/16   Charm RingsErin J Honig, MD  medroxyPROGESTERone (DEPO-PROVERA) 150 MG/ML injection Inject 1 mL (150 mg total) into the muscle every 3 (three) months. Patient taking differently: Inject 150 mg into the muscle every 3 (three) months. Pt received last dose on 02/24/15 - next dose due 05/27/15 01/15/15   Montez MoritaMarie D Lawson, CNM  naproxen (NAPROSYN) 375 MG tablet Take 1 tablet (375 mg total) by mouth 2 (two) times daily.  08/27/16   Hayden Rasmussenavid Carly Applegate, NP  Olopatadine HCl (PATADAY) 0.2 % SOLN 1 drop into bilateral eyes once daily as needed for redness 05/27/16   Tyrone Nineyan B Grunz, MD  oxymetazoline (AFRIN NASAL SPRAY) 0.05 % nasal spray Place 1 spray into both nostrils 2 (two) times daily. for no more than 3 days 05/27/16   Tyrone Nineyan B Grunz, MD  pseudoephedrine (SUDAFED) 30 MG tablet Take 30 mg by mouth every 4 (four) hours as needed for congestion.    Historical Provider, MD  pseudoephedrine-guaifenesin [facility](MUCINEX D) 60-600 MG 12 hr tablet 1 tab every 12 hrs 05/03/16   Joycie PeekBenjamin Cartner, PA-C  sulfamethoxazole-trimethoprim (BACTRIM DS,SEPTRA DS) 800-160 MG tablet Take 1 tablet by mouth 2 (two) times daily. 08/27/16 09/03/16  Hayden Rasmussenavid Sophy Mesler, NP   Meds Ordered and Administered this Visit   Medications  ketorolac (TORADOL) injection 60 mg (60 mg Intramuscular Given 08/27/16 1610)    BP 155/78 (BP Location: Right Arm)   Pulse 110   Temp 98.2 F (36.8 C) (Oral)   Resp 18   SpO2 100%  No data found.   Physical Exam  Constitutional: She appears well-developed and well-nourished. No distress.  HENT:  Head: Normocephalic and atraumatic.  Neck: Normal range of motion. Neck supple.  Cardiovascular: Normal rate.   Pulmonary/Chest: Effort normal.  Musculoskeletal:  Reproducible severe tenderness to the right paralumbar musculature. No spinal tenderness.   Nursing note and vitals reviewed.   Urgent Care Course   Clinical Course as of Aug 28 1643  Tue Aug 27, 2016  1545 Leukocytes, UA: (!) SMALL [DM]  1546 Leukocytes, UA: (!) SMALL [DM]    Clinical Course User Index [DM] Hayden Rasmussenavid Kameryn Davern, NP    Procedures (including critical care time)  Labs Review Labs Reviewed  POCT URINALYSIS DIP (DEVICE) - Abnormal; Notable for the following:       Result Value   Hgb urine dipstick MODERATE (*)    Protein, ur 100 (*)    Leukocytes, UA SMALL (*)    All other components within normal limits  URINE CULTURE  URINE CULTURE  POCT PREGNANCY, URINE     Imaging Review No results found.   Visual Acuity Review  Right Eye Distance:   Left Eye Distance:   Bilateral Distance:    Right Eye Near:   Left Eye Near:    Bilateral Near:         MDM   1. Acute right-sided low back pain without sciatica   2. Muscle strain   3. Cystitis    Apply heat to the back. Take medications as directed. Drink plenty of fluids stay well-hydrated. May also take AZO for urinary symptoms. Follow-up with your primary  care doctor as needed later this week. Call for appointment. If he developed fevers, increased pain or worsening go to the emergency department. Meds ordered this encounter  Medications  . ketorolac (TORADOL) injection 60 mg  . naproxen (NAPROSYN) 375 MG tablet    Sig: Take 1 tablet (375 mg total) by mouth 2 (two) times daily.    Dispense:  20 tablet    Refill:  0    Order Specific Question:   Supervising Provider    Answer:   Linna HoffKINDL, JAMES D [phone]]  . sulfamethoxazole-trimethoprim (BACTRIM DS,SEPTRA DS) 800-160 MG tablet    Sig: Take 1 tablet by mouth 2 (two) times daily.    Dispense:  12 tablet    Refill:  0    Order Specific Question:   Supervising Provider    Answer:   Linna HoffKINDL, JAMES D [[id number]]       Hayden Rasmussenavid Salvador Bigbee, NP 08/27/16 1644

## 2016-08-30 LAB — URINE CULTURE: Culture: >=100000 — AB

## 2016-08-31 ENCOUNTER — Emergency Department (HOSPITAL_COMMUNITY): Payer: Medicare Other

## 2016-08-31 ENCOUNTER — Emergency Department (HOSPITAL_COMMUNITY)
Admission: EM | Admit: 2016-08-31 | Discharge: 2016-08-31 | Disposition: A | Discharge status: Home / Self Care | Payer: Medicare Other | Attending: Emergency Medicine | Admitting: Emergency Medicine

## 2016-08-31 ENCOUNTER — Encounter (HOSPITAL_COMMUNITY): Payer: Self-pay | Admitting: Emergency Medicine

## 2016-08-31 DIAGNOSIS — F17210 Nicotine dependence, cigarettes, uncomplicated: Secondary | ICD-10-CM | POA: Insufficient documentation

## 2016-08-31 DIAGNOSIS — N12 Tubulo-interstitial nephritis, not specified as acute or chronic: Secondary | ICD-10-CM | POA: Insufficient documentation

## 2016-08-31 DIAGNOSIS — J45901 Unspecified asthma with (acute) exacerbation: Secondary | ICD-10-CM

## 2016-08-31 DIAGNOSIS — J3489 Other specified disorders of nose and nasal sinuses: Secondary | ICD-10-CM | POA: Insufficient documentation

## 2016-08-31 DIAGNOSIS — J4521 Mild intermittent asthma with (acute) exacerbation: Secondary | ICD-10-CM | POA: Diagnosis not present

## 2016-08-31 DIAGNOSIS — Z79899 Other long term (current) drug therapy: Secondary | ICD-10-CM | POA: Insufficient documentation

## 2016-08-31 DIAGNOSIS — R51 Headache: Secondary | ICD-10-CM

## 2016-08-31 DIAGNOSIS — R109 Unspecified abdominal pain: Secondary | ICD-10-CM | POA: Diagnosis present

## 2016-08-31 DIAGNOSIS — R519 Headache, unspecified: Secondary | ICD-10-CM

## 2016-08-31 DIAGNOSIS — N10 Acute tubulo-interstitial nephritis: Secondary | ICD-10-CM | POA: Diagnosis not present

## 2016-08-31 DIAGNOSIS — J45909 Unspecified asthma, uncomplicated: Secondary | ICD-10-CM | POA: Diagnosis not present

## 2016-08-31 DIAGNOSIS — R0602 Shortness of breath: Secondary | ICD-10-CM | POA: Diagnosis not present

## 2016-08-31 LAB — LIPASE, BLOOD: LIPASE: 21 U/L (ref 11–51)

## 2016-08-31 LAB — URINALYSIS, ROUTINE W REFLEX MICROSCOPIC
Bilirubin Urine: NEGATIVE
Glucose, UA: NEGATIVE mg/dL
Ketones, ur: NEGATIVE mg/dL
LEUKOCYTES UA: NEGATIVE
Nitrite: NEGATIVE
PROTEIN: NEGATIVE mg/dL
SPECIFIC GRAVITY, URINE: 1.005 (ref 1.005–1.030)
pH: 6.5 (ref 5.0–8.0)

## 2016-08-31 LAB — COMPREHENSIVE METABOLIC PANEL
ALBUMIN: 3.4 g/dL — AB (ref 3.5–5.0)
ALT: 21 U/L (ref 14–54)
ANION GAP: 11 (ref 5–15)
AST: 24 U/L (ref 15–41)
Alkaline Phosphatase: 56 U/L (ref 38–126)
BILIRUBIN TOTAL: 0.5 mg/dL (ref 0.3–1.2)
BUN: 6 mg/dL (ref 6–20)
CHLORIDE: 104 mmol/L (ref 101–111)
CO2: 21 mmol/L — ABNORMAL LOW (ref 22–32)
Calcium: 8.7 mg/dL — ABNORMAL LOW (ref 8.9–10.3)
Creatinine, Ser: 0.98 mg/dL (ref 0.44–1.00)
GFR calc Af Amer: >60 mL/min (ref >60)
Glucose, Bld: 110 mg/dL — ABNORMAL HIGH (ref 65–99)
POTASSIUM: 3.3 mmol/L — AB (ref 3.5–5.1)
Sodium: 136 mmol/L (ref 135–145)
TOTAL PROTEIN: 6.9 g/dL (ref 6.5–8.1)

## 2016-08-31 LAB — CBC WITH DIFFERENTIAL/PLATELET
BASOS ABS: 0 10*3/uL (ref 0.0–0.1)
BASOS PCT: 0 %
EOS PCT: 1 %
Eosinophils Absolute: 0.1 10*3/uL (ref 0.0–0.7)
HCT: 37.2 % (ref 36.0–46.0)
Hemoglobin: 12.3 g/dL (ref 12.0–15.0)
Lymphocytes Relative: 28 %
Lymphs Abs: 2.3 10*3/uL (ref 0.7–4.0)
MCH: 28 pg (ref 26.0–34.0)
MCHC: 33.1 g/dL (ref 30.0–36.0)
MCV: 84.7 fL (ref 78.0–100.0)
MONO ABS: 0.4 10*3/uL (ref 0.1–1.0)
MONOS PCT: 5 %
Neutro Abs: 5.3 10*3/uL (ref 1.7–7.7)
Neutrophils Relative %: 66 %
PLATELETS: 229 10*3/uL (ref 150–400)
RBC: 4.39 MIL/uL (ref 3.87–5.11)
RDW: 13.9 % (ref 11.5–15.5)
WBC: 8.1 10*3/uL (ref 4.0–10.5)

## 2016-08-31 LAB — CK: CK TOTAL: 117 U/L (ref 38–234)

## 2016-08-31 LAB — URINE MICROSCOPIC-ADD ON: WBC UA: NONE SEEN WBC/hpf (ref 0–5)

## 2016-08-31 LAB — POC URINE PREG, ED: PREG TEST UR: NEGATIVE

## 2016-08-31 MED ORDER — METHOCARBAMOL 500 MG PO TABS
1000.0000 mg | ORAL_TABLET | Freq: Once | ORAL | Status: AC
  Administered 2016-08-31: 1000 mg via ORAL
  Filled 2016-08-31: qty 2

## 2016-08-31 MED ORDER — IOPAMIDOL (ISOVUE-300) INJECTION 61%
INTRAVENOUS | Status: AC
  Administered 2016-08-31: 100 mL
  Filled 2016-08-31: qty 100

## 2016-08-31 MED ORDER — KETOROLAC TROMETHAMINE 30 MG/ML IJ SOLN
30.0000 mg | Freq: Once | INTRAMUSCULAR | Status: AC
  Administered 2016-08-31: 30 mg via INTRAVENOUS
  Filled 2016-08-31: qty 1

## 2016-08-31 MED ORDER — METHOCARBAMOL 500 MG PO TABS: 1000.0000 mg | ORAL_TABLET | Freq: Three times a day (TID) | ORAL | 0 refills | Status: DC | PRN

## 2016-08-31 MED ORDER — DEXTROSE 5 % IV SOLN
1.0000 g | Freq: Once | INTRAVENOUS | Status: AC
  Administered 2016-08-31: 1 g via INTRAVENOUS
  Filled 2016-08-31: qty 10

## 2016-08-31 MED ORDER — ALBUTEROL SULFATE (2.5 MG/3ML) 0.083% IN NEBU
5.0000 mg | INHALATION_SOLUTION | Freq: Once | RESPIRATORY_TRACT | Status: AC
  Administered 2016-08-31: 5 mg via RESPIRATORY_TRACT
  Filled 2016-08-31: qty 6

## 2016-08-31 MED ORDER — CEPHALEXIN 500 MG PO CAPS: 500.0000 mg | ORAL_CAPSULE | Freq: Three times a day (TID) | ORAL | 0 refills | Status: DC

## 2016-08-31 MED ORDER — KETOROLAC TROMETHAMINE 60 MG/2ML IM SOLN: 60.0000 mg | Freq: Once | INTRAMUSCULAR | Status: DC

## 2016-08-31 NOTE — ED Triage Notes (Signed)
Pt reports shortness of breath and headache ongoing this week, states worsening tonight, unrelieved by albuterol treatments.States seen earlier this week for R sided flank/back pain, started on bactrim but hasn't gotten any better. States fever yesterday at 102.1, no fever in triage. Productive cough with brown/yellow sputum.

## 2016-08-31 NOTE — ED Notes (Signed)
Pt ambulated independently to restroom.  Gait steady and even.   

## 2016-08-31 NOTE — ED Notes (Signed)
Pt requesting the reason for her antibiotics, Dr Ranae PalmsYelverton aware and will talk to pt.

## 2016-08-31 NOTE — ED Provider Notes (Signed)
[facility] DEPT Provider Note   CSN: [id number] Arrival date & time: 08/31/16  0010     History   Chief Complaint Chief Complaint  Patient presents with  . Shortness of Breath  . Headache  . Back Pain    HPI Stacy Blackwell is a 35 y.o. female.  HPI Patient presents with multiple complaints. Recently seen in urgent care and diagnosed with UTI. Was prescribed Bactrim and naproxen. States that since starting the antibiotic on 11/8 she's had increased wheezing with cough and yellow sputum production. She also has been febrile. States her urinary symptoms have improved. She thinks that the wheezing is caused by the Bactrim. She has worsening right flank pain which is been present since she was seen in urgent care. She describes the pain as spasming in nature. Better with standing and movement. Patient also complains of frontal headache. She's had nasal congestion. The headache has been present for the past 3 days. No sore throat. She has photophobia and some nausea. No focal weakness or numbness. Patient states she's been compliant with her Zyrtec. Past Medical History:  Diagnosis Date  . Acid reflux   . Asthma   . Depression    doing good, no meds  . Obesity   . Ovarian cyst   . Vertigo     Patient Active Problem List   Diagnosis Date Noted  . NSVD (normal spontaneous vaginal delivery) 01/14/2015  . Premature rupture of membranes 01/13/2015  . Active labor at term 01/13/2015  . Polyhydramnios in third trimester   . [redacted] weeks gestation of pregnancy   . Polyhydramnios in third trimester, antepartum   . [redacted] weeks gestation of pregnancy   . Large for dates   . [redacted] weeks gestation of pregnancy   . Abdominal pain in pregnancy, antepartum   . [redacted] weeks gestation of pregnancy   . Evaluate anatomy not seen on prior sonogram   . Obesity in pregnancy with antepartum complication   . [redacted] weeks gestation of pregnancy   . Encounter for fetal anatomic survey   . [redacted] weeks  gestation of pregnancy   . 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 Allergic rhinitis 07/01/2014  . GERD (gastroesophageal reflux disease) 07/01/2014  . Tobacco use disorder 07/01/2014    Past Surgical History:  Procedure Laterality Date  . CYST EXCISION Left 1987    OB History    Gravida Para Term Preterm AB Living   2 2 2  0 0 2   SAB TAB Ectopic Multiple Live Births   0 0 0 0 2       Home Medications    Prior to Admission medications   Medication Sig Start Date End Date Taking? Authorizing Provider  albuterol (PROVENTIL HFA;VENTOLIN HFA) 108 (90 BASE) MCG/ACT inhaler Inhale 2 puffs into the lungs every 6 (six) hours as needed for wheezing or shortness of breath. 08/02/14   Marny LowensteinJulie N Wenzel, PA-C  albuterol (PROVENTIL) (5 MG/ML) 0.5% nebulizer solution Take 2.5 mg by nebulization every 6 (six) hours as needed for wheezing or shortness of breath.    Historical Provider, MD  budesonide-formoterol (SYMBICORT) 160-4.5 MCG/ACT inhaler Inhale 2 puffs into the lungs 2 (two) times daily. 03/15/15   Leslye Peerobert S Byrum, MD  cephALEXin (KEFLEX) 500 MG capsule Take 1 capsule (500 mg total) by mouth 3 (three) times daily. 08/31/16   Loren Raceravid Marti Mclane, MD  cetirizine (ZYRTEC) 10 MG tablet Take 1 tablet (10 mg total) by mouth daily. 03/15/15   Leslye Peerobert S Byrum, MD  fexofenadine (ALLEGRA) 60 MG tablet Take 1 tablet (60 mg total) by mouth 2 (two) times daily. 07/30/16   Derwood KaplanAnkit Nanavati, MD  fluticasone (FLONASE) 50 MCG/ACT nasal spray Place 2 sprays into both nostrils daily. 05/03/16   Joycie PeekBenjamin Cartner, PA-C  HYDROcodone-acetaminophen (NORCO) 5-325 MG tablet Take 1 tablet by mouth every 6 (six) hours as needed for moderate pain. 02/27/16   Charm RingsErin J Honig, MD  medroxyPROGESTERone (DEPO-PROVERA) 150 MG/ML injection Inject 1 mL (150 mg total) into the muscle every 3 (three) months. Patient taking differently: Inject 150 mg into the muscle every 3 (three) months. Pt received last dose on 02/24/15 - next dose due 05/27/15 01/15/15    Montez MoritaMarie D Lawson, CNM  methocarbamol (ROBAXIN) 500 MG tablet Take 2 tablets (1,000 mg total) by mouth every 8 (eight) hours as needed for muscle spasms. 08/31/16   Loren Raceravid Yitta Gongaware, MD  naproxen (NAPROSYN) 375 MG tablet Take 1 tablet (375 mg total) by mouth 2 (two) times daily. 08/27/16   Hayden Rasmussenavid Mabe, NP  Olopatadine HCl (PATADAY) 0.2 % SOLN 1 drop into bilateral eyes once daily as needed for redness 05/27/16   Tyrone Nineyan B Grunz, MD  oxymetazoline (AFRIN NASAL SPRAY) 0.05 % nasal spray Place 1 spray into both nostrils 2 (two) times daily. for no more than 3 days 05/27/16   Tyrone Nineyan B Grunz, MD  pseudoephedrine (SUDAFED) 30 MG tablet Take 30 mg by mouth every 4 (four) hours as needed for congestion.    Historical Provider, MD  pseudoephedrine-guaifenesin [facility](MUCINEX D) 60-600 MG 12 hr tablet 1 tab every 12 hrs 05/03/16   Joycie PeekBenjamin Cartner, PA-C    Family History Family History  Problem Relation Age of Onset  . Asthma Mother   . Diabetes Mother   . Heart disease Mother     CHF  . Asthma Sister   . Diabetes Sister   . Heart disease Sister   . Hearing loss Neg Hx     Social History Social History  Substance Use Topics  . Smoking status: Current Every Day Smoker    Packs/day: 0.25    Years: 10.00    Types: Cigarettes  . Smokeless tobacco: Never Used  . Alcohol use No     Allergies   Amoxicillin and Hydrocodone   Review of Systems Review of Systems  Constitutional: Positive for chills and fever. Negative for fatigue.  HENT: Positive for congestion, sinus pain and sinus pressure. Negative for rhinorrhea and sore throat.   Eyes: Positive for photophobia. Negative for visual disturbance.  Respiratory: Positive for cough, shortness of breath and wheezing.   Cardiovascular: Negative for chest pain, palpitations and leg swelling.  Gastrointestinal: Positive for nausea and vomiting. Negative for abdominal pain, constipation and diarrhea.  Genitourinary: Positive for flank pain. Negative for difficulty  urinating, dysuria, frequency, hematuria, pelvic pain, vaginal bleeding and vaginal discharge.  Musculoskeletal: Positive for back pain and myalgias. Negative for neck pain and neck stiffness.  Skin: Negative for rash and wound.  Neurological: Positive for headaches. Negative for dizziness, weakness, light-headedness and numbness.  All other systems reviewed and are negative.    Physical Exam Updated Vital Signs BP 123/77 (BP Location: Left Arm)   Pulse 89   Temp 98.3 F (36.8 C) (Oral)   Resp 20   SpO2 98%   Physical Exam  Constitutional: She is oriented to person, place, and time. She appears well-developed and well-nourished. No distress.  HENT:  Head: Normocephalic and atraumatic.  Mouth/Throat: Oropharynx is clear and moist. No oropharyngeal  exudate.  Sinus tenderness to percussion over the frontal sinuses. Bilateral nasal mucosal edema.  Eyes: EOM are normal. Pupils are equal, round, and reactive to light.  Bilateral injected sclera.  Neck: Normal range of motion. Neck supple.  Cardiovascular: Normal rate and regular rhythm.  Exam reveals no gallop and no friction rub.   No murmur heard. Pulmonary/Chest: Effort normal and breath sounds normal. No stridor. No respiratory distress. She has no wheezes. She has no rales. She exhibits no tenderness.  Abdominal: Soft. Bowel sounds are normal. There is no tenderness. There is no rebound and no guarding.  Musculoskeletal: Normal range of motion. She exhibits tenderness. She exhibits no edema.  Right flank tenderness with percussion.  Lymphadenopathy:    She has no cervical adenopathy.  Neurological: She is alert and oriented to person, place, and time.  Moving all extremities without deficit. Sensation is fully intact.  Skin: Skin is warm and dry. Capillary refill takes less than 2 seconds. No rash noted. No erythema.  Psychiatric: She has a normal mood and affect. Her behavior is normal.  Nursing note and vitals  reviewed.    ED Treatments / Results  Labs (all labs ordered are listed, but only abnormal results are displayed) Labs Reviewed  COMPREHENSIVE METABOLIC PANEL - Abnormal; Notable for the following:       Result Value   Potassium 3.3 (*)    CO2 21 (*)    Glucose, Bld 110 (*)    Calcium 8.7 (*)    Albumin 3.4 (*)    All other components within normal limits  URINALYSIS, ROUTINE W REFLEX MICROSCOPIC (NOT AT [facility] - Abnormal; Notable for the following:    Hgb urine dipstick MODERATE (*)    All other components within normal limits  URINE MICROSCOPIC-ADD ON - Abnormal; Notable for the following:    Squamous Epithelial / LPF 6-30 (*)    Bacteria, UA RARE (*)    All other components within normal limits  CBC WITH DIFFERENTIAL/PLATELET  LIPASE, BLOOD  CK  POC URINE PREG, ED    EKG  EKG Interpretation None       Radiology Dg Chest 2 View  Result Date: 08/31/2016 CLINICAL DATA:  Shortness of breath for 2 days. Symptoms began after completing medication for urinary tract infection. Smoker. EXAM: CHEST  2 VIEW COMPARISON:  07/30/2016 FINDINGS: Normal heart size and pulmonary vascularity. Central interstitial pattern to the lungs suggesting chronic bronchitis. No focal airspace disease or consolidation. No blunting of costophrenic angles. No pneumothorax. Mediastinal contours appear intact. IMPRESSION: Chronic bronchitic changes. No evidence of active pulmonary disease. Electronically Signed   By: Burman Nieves M.D.   On: 08/31/2016 01:35   Ct Abdomen Pelvis W Contrast  Result Date: 08/31/2016 CLINICAL DATA:  Right flank pain for 4 days. History of ovarian cyst. EXAM: CT ABDOMEN AND PELVIS WITH CONTRAST TECHNIQUE: Multidetector CT imaging of the abdomen and pelvis was performed using the standard protocol following bolus administration of intravenous contrast. CONTRAST:  ISOVUE-300 IOPAMIDOL (ISOVUE-300) INJECTION 61% COMPARISON:  None. FINDINGS: Lower chest: Mild  emphysematous changes in the lung bases. Hepatobiliary: No focal liver abnormality is seen. No gallstones, gallbladder wall thickening, or biliary dilatation. Pancreas: Unremarkable. No pancreatic ductal dilatation or surrounding inflammatory changes. Spleen: Normal in size without focal abnormality. Adrenals/Urinary Tract: No adrenal gland nodules. Right kidney demonstrates mild loss of distinction of the renal sinus with mild thickening of the ureters possibly indicating early pyelonephritis. No hydronephrosis or hydroureter and no renal stones  demonstrated. Bladder wall is not thickened. Stomach/Bowel: Stomach is within normal limits. Appendix appears normal. No evidence of bowel wall thickening, distention, or inflammatory changes. Vascular/Lymphatic: No significant vascular findings are present. No enlarged abdominal or pelvic lymph nodes. Reproductive: Uterus and bilateral adnexa are unremarkable. Other: No abdominal wall hernia or abnormality. No abdominopelvic ascites. Musculoskeletal: No acute or significant osseous findings. IMPRESSION: Possible mild inflammatory changes involving the right kidney. Consider pyelonephritis. No abscess or stone. Appendix is normal. Electronically Signed   By: Burman Nieves M.D.   On: 08/31/2016 03:35    Procedures Procedures (including critical care time)  Medications Ordered in ED Medications  albuterol (PROVENTIL) (2.5 MG/3ML) 0.083% nebulizer solution 5 mg (5 mg Nebulization Given 08/31/16 0122)  methocarbamol (ROBAXIN) tablet 1,000 mg (1,000 mg Oral Given 08/31/16 0246)  ketorolac (TORADOL) 30 MG/ML injection 30 mg (30 mg Intravenous Given 08/31/16 0246)  iopamidol (ISOVUE-300) 61 % injection (100 mLs  Contrast Given 08/31/16 0316)  cefTRIAXone (ROCEPHIN) 1 g in dextrose 5 % 50 mL IVPB (0 g Intravenous Stopped 08/31/16 0453)     Initial Impression / Assessment and Plan / ED Course  I have reviewed the triage vital signs and the nursing  notes.  Pertinent labs & imaging results that were available during my care of the patient were reviewed by me and considered in my medical decision making (see chart for details).  Clinical Course    CT with evidence of inflammation around the right kidney. Likely partially treated pyelonephritis. Given IV rocephin in ED and will D/c with course of Keflex and advised to stop taking Bactrim. Return precautions given.     Final Clinical Impressions(s) / ED Diagnoses   Final diagnoses:  Pyelonephritis  Mild asthma with acute exacerbation, unspecified whether persistent  Sinus headache    New Prescriptions Discharge Medication List as of 08/31/2016  5:34 AM    START taking these medications   Details  cephALEXin (KEFLEX) 500 MG capsule Take 1 capsule (500 mg total) by mouth 3 (three) times daily., Starting Sat 08/31/2016, Print    methocarbamol (ROBAXIN) 500 MG tablet Take 2 tablets (1,000 mg total) by mouth every 8 (eight) hours as needed for muscle spasms., Starting Sat 08/31/2016, Print         [name], MD 08/31/16 [phone]

## 2016-09-03 DIAGNOSIS — Z3042 Encounter for surveillance of injectable contraceptive: Secondary | ICD-10-CM | POA: Diagnosis not present

## 2016-09-03 DIAGNOSIS — Z3009 Encounter for other general counseling and advice on contraception: Secondary | ICD-10-CM | POA: Diagnosis not present

## 2016-10-26 ENCOUNTER — Encounter (HOSPITAL_COMMUNITY): Payer: Self-pay | Admitting: Emergency Medicine

## 2016-10-26 ENCOUNTER — Emergency Department (HOSPITAL_COMMUNITY): Payer: Medicare Other

## 2016-10-26 ENCOUNTER — Emergency Department (HOSPITAL_COMMUNITY)
Admission: EM | Admit: 2016-10-26 | Discharge: 2016-10-26 | Disposition: A | Discharge status: Home / Self Care | Payer: Medicare Other | Attending: Emergency Medicine | Admitting: Emergency Medicine

## 2016-10-26 DIAGNOSIS — R05 Cough: Secondary | ICD-10-CM | POA: Diagnosis not present

## 2016-10-26 DIAGNOSIS — J45901 Unspecified asthma with (acute) exacerbation: Secondary | ICD-10-CM | POA: Diagnosis not present

## 2016-10-26 DIAGNOSIS — F17210 Nicotine dependence, cigarettes, uncomplicated: Secondary | ICD-10-CM | POA: Diagnosis not present

## 2016-10-26 DIAGNOSIS — R062 Wheezing: Secondary | ICD-10-CM | POA: Diagnosis not present

## 2016-10-26 DIAGNOSIS — R0602 Shortness of breath: Secondary | ICD-10-CM | POA: Diagnosis present

## 2016-10-26 LAB — BASIC METABOLIC PANEL
ANION GAP: 8 (ref 5–15)
BUN: 9 mg/dL (ref 6–20)
CHLORIDE: 106 mmol/L (ref 101–111)
CO2: 21 mmol/L — AB (ref 22–32)
Calcium: 9.1 mg/dL (ref 8.9–10.3)
Creatinine, Ser: 0.88 mg/dL (ref 0.44–1.00)
GFR calc Af Amer: >60 mL/min (ref >60)
GFR calc non Af Amer: >60 mL/min (ref >60)
GLUCOSE: 132 mg/dL — AB (ref 65–99)
POTASSIUM: 4.1 mmol/L (ref 3.5–5.1)
Sodium: 135 mmol/L (ref 135–145)

## 2016-10-26 LAB — CBC
HEMATOCRIT: 38.5 % (ref 36.0–46.0)
HEMOGLOBIN: 12.9 g/dL (ref 12.0–15.0)
MCH: 28.2 pg (ref 26.0–34.0)
MCHC: 33.5 g/dL (ref 30.0–36.0)
MCV: 84.1 fL (ref 78.0–100.0)
PLATELETS: 240 10*3/uL (ref 150–400)
RBC: 4.58 MIL/uL (ref 3.87–5.11)
RDW: 14.4 % (ref 11.5–15.5)
WBC: 6.3 10*3/uL (ref 4.0–10.5)

## 2016-10-26 MED ORDER — PREDNISONE 20 MG PO TABS
40.0000 mg | ORAL_TABLET | Freq: Every day | ORAL | 0 refills | Status: DC
Start: 2016-10-26 — End: 2017-04-06

## 2016-10-26 MED ORDER — ALBUTEROL SULFATE HFA 108 (90 BASE) MCG/ACT IN AERS: 2.0000 | INHALATION_SPRAY | RESPIRATORY_TRACT | 3 refills | Status: DC | PRN

## 2016-10-26 MED ORDER — ALBUTEROL SULFATE HFA 108 (90 BASE) MCG/ACT IN AERS
2.0000 | INHALATION_SPRAY | RESPIRATORY_TRACT | Status: DC | PRN
  Administered 2016-10-26: 2 via RESPIRATORY_TRACT
  Filled 2016-10-26: qty 6.7

## 2016-10-26 MED ORDER — IPRATROPIUM BROMIDE 0.02 % IN SOLN
1.0000 mg | Freq: Once | RESPIRATORY_TRACT | Status: AC
  Administered 2016-10-26: 1 mg via RESPIRATORY_TRACT
  Filled 2016-10-26: qty 5

## 2016-10-26 MED ORDER — AEROCHAMBER PLUS W/MASK MISC
1.0000 | Freq: Once | Status: AC
  Administered 2016-10-26: 1
  Filled 2016-10-26: qty 1

## 2016-10-26 MED ORDER — ALBUTEROL (5 MG/ML) CONTINUOUS INHALATION SOLN
10.0000 mg/h | INHALATION_SOLUTION | RESPIRATORY_TRACT | Status: AC
  Administered 2016-10-26: 10 mg/h via RESPIRATORY_TRACT
  Filled 2016-10-26: qty 20

## 2016-10-26 NOTE — ED Notes (Signed)
Patient transported to X-ray 

## 2016-10-26 NOTE — ED Notes (Signed)
Pt requested removal of EMS-placed IV d/t discomfort. Explained importance of maintaining present access. Pt chiefly concerned with pain/discomfort of patent IV in her RAC.

## 2016-10-26 NOTE — ED Triage Notes (Signed)
Per EMS:  Pt presents to ED for assessment after "having a flair up of my asthma".  Pt has had increased SOB and productive cough with green phlegm x 3 days.  Pt has used her nebulizer at home 6 times today.  She was also given albuterol x 1 with fire, and duoneb x 2 with ems.  Pt given 125 Solu-medrol en route as well.  ST on monitor.

## 2016-10-26 NOTE — ED Provider Notes (Signed)
[facility] Provider Note   CSN: [id number] Arrival date & time: 10/26/16  0226     History   Chief Complaint Chief Complaint  Patient presents with  . Shortness of Breath  . Asthma    HPI Stacy Blackwell is a 36 y.o. female with a hx of Acid reflux, asthma, depression, obesity presents to the Emergency Department complaining of gradual, persistent, progressively worsening shortness of breath and wheezing onset 2 days ago. Patient reports using home nebulizers without relief. Patient transported by EMS and was given albuterol 3 and Solu-Medrol 125 mg.  Patient reports persistent wheezing in spite of these treatments. Patient with associated cough, rhinorrhea for the last 3 days. Patient denies sick contacts, fever at home for chest pain nausea, vomiting. She did not get a flu shot this year. No aggravating factors. Albuterol improved symptoms but only briefly.  The history is provided by the patient, medical records and the EMS personnel. No language interpreter was used.  Asthma  Associated symptoms include shortness of breath.    Past Medical History:  Diagnosis Date  . Acid reflux   . Asthma   . Depression    doing good, no meds  . Obesity   . Ovarian cyst   . Vertigo     Patient Active Problem List   Diagnosis Date Noted  . NSVD (normal spontaneous vaginal delivery) 01/14/2015  . Premature rupture of membranes 01/13/2015  . Active labor at term 01/13/2015  . Polyhydramnios in third trimester   . [redacted] weeks gestation of pregnancy   . Polyhydramnios in third trimester, antepartum   . [redacted] weeks gestation of pregnancy   . Large for dates   . [redacted] weeks gestation of pregnancy   . Abdominal pain in pregnancy, antepartum   . [redacted] weeks gestation of pregnancy   . Evaluate anatomy not seen on prior sonogram   . Obesity in pregnancy with antepartum complication   . [redacted] weeks gestation of pregnancy   . Encounter for fetal anatomic survey   . [redacted] weeks gestation of  pregnancy   . 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 Allergic rhinitis 07/01/2014  . GERD (gastroesophageal reflux disease) 07/01/2014  . Tobacco use disorder 07/01/2014    Past Surgical History:  Procedure Laterality Date  . CYST EXCISION Left 1987    OB History    Gravida Para Term Preterm AB Living   2 2 2  0 0 2   SAB TAB Ectopic Multiple Live Births   0 0 0 0 2       Home Medications    Prior to Admission medications   Medication Sig Start Date End Date Taking? Authorizing Provider  albuterol (PROVENTIL HFA;VENTOLIN HFA) 108 (90 BASE) MCG/ACT inhaler Inhale 2 puffs into the lungs every 6 (six) hours as needed for wheezing or shortness of breath. 08/02/14  Yes Marny Lowenstein, PA-C  albuterol (PROVENTIL) (5 MG/ML) 0.5% nebulizer solution Take 2.5 mg by nebulization every 6 (six) hours as needed for wheezing or shortness of breath.   Yes Historical Provider, MD  medroxyPROGESTERone (DEPO-PROVERA) 150 MG/ML injection Inject 1 mL (150 mg total) into the muscle every 3 (three) months. 01/15/15  Yes Montez Morita, CNM  pseudoephedrine (SUDAFED) 30 MG tablet Take 30 mg by mouth every 4 (four) hours as needed for congestion.   Yes Historical Provider, MD  albuterol (PROVENTIL HFA;VENTOLIN HFA) 108 (90 Base) MCG/ACT inhaler Inhale 2 puffs into the lungs every 4 (four) hours as needed for wheezing  or shortness of breath. 10/26/16   Hilario Robarts, PA-C  predniSONE (DELTASONE) 20 MG tablet Take 2 tablets (40 mg total) by mouth daily. 10/26/16   Dahlia ClientHannah Jahleah Mariscal, PA-C    Family History Family History  Problem Relation Age of Onset  . Asthma Mother   . Diabetes Mother   . Heart disease Mother     CHF  . Asthma Sister   . Diabetes Sister   . Heart disease Sister   . Hearing loss Neg Hx     Social History Social History  Substance Use Topics  . Smoking status: Current Some Day Smoker    Packs/day: 0.25    Years: 10.00    Types: Cigarettes  . Smokeless tobacco: Never Used  .  Alcohol use No     Allergies   Amoxicillin and Hydrocodone   Review of Systems Review of Systems  Respiratory: Positive for cough, shortness of breath and wheezing.   All other systems reviewed and are negative.    Physical Exam Updated Vital Signs BP 124/58   Pulse 109   Resp 16   Ht 5\' 2"  (1.575 m)   Wt 127 kg   SpO2 98%   BMI 51.21 kg/m   Physical Exam  Constitutional: She appears well-developed and well-nourished. No distress.  Awake, alert, nontoxic appearance  HENT:  Head: Normocephalic and atraumatic.  Right Ear: Tympanic membrane, external ear and ear canal normal.  Left Ear: Tympanic membrane, external ear and ear canal normal.  Nose: Mucosal edema and rhinorrhea present. No epistaxis. Right sinus exhibits no maxillary sinus tenderness and no frontal sinus tenderness. Left sinus exhibits no maxillary sinus tenderness and no frontal sinus tenderness.  Mouth/Throat: Uvula is midline, oropharynx is clear and moist and mucous membranes are normal. Mucous membranes are not pale and not cyanotic. No oropharyngeal exudate, posterior oropharyngeal edema, posterior oropharyngeal erythema or tonsillar abscesses.  Eyes: Conjunctivae are normal. Pupils are equal, round, and reactive to light. No scleral icterus.  Neck: Normal range of motion and full passive range of motion without pain. Neck supple.  Cardiovascular: Normal rate, regular rhythm and intact distal pulses.   Pulmonary/Chest: Accessory muscle usage present. No stridor. Tachypnea noted. No respiratory distress. She has decreased breath sounds. She has wheezes ( throughout).  Clear and equal breath sounds without focal wheezes, rhonchi, rales  Abdominal: Soft. Bowel sounds are normal. She exhibits no mass. There is no tenderness. There is no rebound and no guarding.  Musculoskeletal: Normal range of motion. She exhibits no edema.  Lymphadenopathy:    She has no cervical adenopathy.  Neurological: She is alert.    Speech is clear and goal oriented Moves extremities without ataxia  Skin: Skin is warm and dry. No rash noted. She is not diaphoretic.  Psychiatric: She has a normal mood and affect.  Nursing note and vitals reviewed.    ED Treatments / Results  Labs (all labs ordered are listed, but only abnormal results are displayed) Labs Reviewed  BASIC METABOLIC PANEL - Abnormal; Notable for the following:       Result Value   CO2 21 (*)    Glucose, Bld 132 (*)    All other components within normal limits  CBC     Radiology Dg Chest 2 View  Result Date: 10/26/2016 CLINICAL DATA:  Productive cough and dyspnea for 3 days EXAM: CHEST  2 VIEW COMPARISON:  08/31/2016 FINDINGS: The heart size and mediastinal contours are within normal limits. Both lungs are clear. The  visualized skeletal structures are unremarkable. IMPRESSION: No active cardiopulmonary disease. Electronically Signed   By: Ellery Plunk M.D.   On: 10/26/2016 03:20    Procedures Procedures (including critical care time)  Medications Ordered in ED Medications  albuterol (PROVENTIL,VENTOLIN) solution continuous neb (10 mg/hr Nebulization New Bag/Given 10/26/16 0410)  albuterol (PROVENTIL HFA;VENTOLIN HFA) 108 (90 Base) MCG/ACT inhaler 2 puff (not administered)  aerochamber plus with mask device 1 each (not administered)  ipratropium (ATROVENT) nebulizer solution 1 mg (1 mg Nebulization Given 10/26/16 0410)     Initial Impression / Assessment and Plan / ED Course  I have reviewed the triage vital signs and the nursing notes.  Pertinent labs & imaging results that were available during my care of the patient were reviewed by me and considered in my medical decision making (see chart for details).  Clinical Course     Patient given continuous nebulizer here in the emergency department. Now with clear and equal breath sounds. She reports she is breathing at baseline and feels well. She wishes for discharge home. Patient  ambulated in ED with O2 saturations maintained >90, no current signs of respiratory distress. Steroids given prior to arrival and patient will be discharged home with a burst. Pt has been instructed to continue using prescribed medications and to speak with PCP about today's exacerbation. Labs reassuring. No evidence of pneumonia on her chest x-ray.   Final Clinical Impressions(s) / ED Diagnoses   Final diagnoses:  Exacerbation of asthma, unspecified asthma severity, unspecified whether persistent    New Prescriptions New Prescriptions   ALBUTEROL (PROVENTIL HFA;VENTOLIN HFA) 108 (90 BASE) MCG/ACT INHALER    Inhale 2 puffs into the lungs every 4 (four) hours as needed for wheezing or shortness of breath.   PREDNISONE (DELTASONE) 20 MG TABLET    Take 2 tablets (40 mg total) by mouth daily.     Dahlia Client Dung Salinger, PA-C 10/26/16 4098    Layla Maw Ward, DO 10/26/16 [phone]

## 2016-10-26 NOTE — ED Notes (Signed)
Pt departed in NAD.  

## 2016-10-26 NOTE — Discharge Instructions (Signed)
1. Medications: albuterol, prednisone, usual home medications °2. Treatment: rest, drink plenty of fluids, begin OTC antihistamine (Zyrtec or Claritin)  °3. Follow Up: Please followup with your primary doctor in 2-3 days for discussion of your diagnoses and further evaluation after today's visit; if you do not have a primary care doctor use the resource guide provided to find one; Please return to the ER for difficulty breathing, high fevers or worsening symptoms. ° °

## 2016-12-02 DIAGNOSIS — Z3042 Encounter for surveillance of injectable contraceptive: Secondary | ICD-10-CM | POA: Diagnosis not present

## 2016-12-02 DIAGNOSIS — Z3009 Encounter for other general counseling and advice on contraception: Secondary | ICD-10-CM | POA: Diagnosis not present

## 2016-12-17 ENCOUNTER — Encounter (HOSPITAL_COMMUNITY): Payer: Self-pay | Admitting: Emergency Medicine

## 2016-12-17 ENCOUNTER — Emergency Department (HOSPITAL_COMMUNITY)
Admission: EM | Admit: 2016-12-17 | Discharge: 2016-12-17 | Disposition: A | Discharge status: Home / Self Care | Payer: Medicare Other | Attending: Emergency Medicine | Admitting: Emergency Medicine

## 2016-12-17 DIAGNOSIS — J45909 Unspecified asthma, uncomplicated: Secondary | ICD-10-CM | POA: Diagnosis not present

## 2016-12-17 DIAGNOSIS — R42 Dizziness and giddiness: Secondary | ICD-10-CM

## 2016-12-17 DIAGNOSIS — F17210 Nicotine dependence, cigarettes, uncomplicated: Secondary | ICD-10-CM | POA: Diagnosis not present

## 2016-12-17 DIAGNOSIS — J329 Chronic sinusitis, unspecified: Secondary | ICD-10-CM | POA: Diagnosis not present

## 2016-12-17 MED ORDER — CETIRIZINE-PSEUDOEPHEDRINE ER 5-120 MG PO TB12: 1.0000 | ORAL_TABLET | Freq: Every day | ORAL | 0 refills | Status: DC

## 2016-12-17 MED ORDER — MECLIZINE HCL 25 MG PO TABS: 25.0000 mg | ORAL_TABLET | Freq: Three times a day (TID) | ORAL | 0 refills | Status: DC | PRN

## 2016-12-17 NOTE — ED Provider Notes (Signed)
[facility]Y DEPT Provider Note   CSN: [id number] Arrival date & time: 12/17/16  0815     History   Chief Complaint Chief Complaint  Patient presents with  . Otalgia  . Dizziness    HPI Stacy Blackwell is a 36 y.o. female.  HPI Patient states that she started to get pressure behind her ears this morning. She also feels the dizziness. It comes and goes. It has a somewhat spinning and movement quality to it. No headache or facial pain. Patient reports she does have fairly chronic problems with nasal congestion and her sinuses. She regularly uses Flonase and Zyrtec. She has not had any fever. No sore throat. Past Medical History:  Diagnosis Date  . Acid reflux   . Asthma   . Depression    doing good, no meds  . Obesity   . Ovarian cyst   . Vertigo     Patient Active Problem List   Diagnosis Date Noted  . NSVD (normal spontaneous vaginal delivery) 01/14/2015  . Premature rupture of membranes 01/13/2015  . Active labor at term 01/13/2015  . Polyhydramnios in third trimester   . [redacted] weeks gestation of pregnancy   . Polyhydramnios in third trimester, antepartum   . [redacted] weeks gestation of pregnancy   . Large for dates   . [redacted] weeks gestation of pregnancy   . Abdominal pain in pregnancy, antepartum   . [redacted] weeks gestation of pregnancy   . Evaluate anatomy not seen on prior sonogram   . Obesity in pregnancy with antepartum complication   . [redacted] weeks gestation of pregnancy   . Encounter for fetal anatomic survey   . [redacted] weeks gestation of pregnancy   . 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 Allergic rhinitis 07/01/2014  . GERD (gastroesophageal reflux disease) 07/01/2014  . Tobacco use disorder 07/01/2014    Past Surgical History:  Procedure Laterality Date  . CYST EXCISION Left 1987    OB History    Gravida Para Term Preterm AB Living   2 2 2  0 0 2   SAB TAB Ectopic Multiple Live Births   0 0 0 0 2       Home Medications    Prior to Admission medications     Medication Sig Start Date End Date Taking? Authorizing Provider  albuterol (PROVENTIL HFA;VENTOLIN HFA) 108 (90 BASE) MCG/ACT inhaler Inhale 2 puffs into the lungs every 6 (six) hours as needed for wheezing or shortness of breath. 08/02/14   Marny Lowenstein, PA-C  albuterol (PROVENTIL HFA;VENTOLIN HFA) 108 (90 Base) MCG/ACT inhaler Inhale 2 puffs into the lungs every 4 (four) hours as needed for wheezing or shortness of breath. 10/26/16   Hannah Muthersbaugh, PA-C  albuterol (PROVENTIL) (5 MG/ML) 0.5% nebulizer solution Take 2.5 mg by nebulization every 6 (six) hours as needed for wheezing or shortness of breath.    Historical Provider, MD  cetirizine-pseudoephedrine (ZYRTEC-D) 5-120 MG tablet Take 1 tablet by mouth daily. 12/17/16   Arby Barrette, MD  meclizine (ANTIVERT) 25 MG tablet Take 1 tablet (25 mg total) by mouth 3 (three) times daily as needed for dizziness. 12/17/16   Arby Barrette, MD  medroxyPROGESTERone (DEPO-PROVERA) 150 MG/ML injection Inject 1 mL (150 mg total) into the muscle every 3 (three) months. 01/15/15   Montez Morita, CNM  predniSONE (DELTASONE) 20 MG tablet Take 2 tablets (40 mg total) by mouth daily. 10/26/16   Hannah Muthersbaugh, PA-C  pseudoephedrine (SUDAFED) 30 MG tablet Take 30 mg by mouth  every 4 (four) hours as needed for congestion.    Historical Provider, MD    Family History Family History  Problem Relation Age of Onset  . Asthma Mother   . Diabetes Mother   . Heart disease Mother     CHF  . Asthma Sister   . Diabetes Sister   . Heart disease Sister   . Hearing loss Neg Hx     Social History Social History  Substance Use Topics  . Smoking status: Current Some Day Smoker    Packs/day: 0.25    Years: 10.00    Types: Cigarettes  . Smokeless tobacco: Never Used  . Alcohol use No     Allergies   Amoxicillin and Hydrocodone   Review of Systems Review of Systems Constitutional: No fevers no chills no malaise Neurologic: No focal weakness numbness  tingling or gait incoordination  Physical Exam Updated Vital Signs BP 140/57 (BP Location: Left Arm)   Pulse 82   Temp 98.9 F (37.2 C) (Oral)   Resp 14   SpO2 100%   Physical Exam  Constitutional: She is oriented to person, place, and time.  Patient is alert and nontoxic. No acute distress. Morbid obesity.  HENT:  Head: Normocephalic and atraumatic.  Right Ear: External ear normal.  Left Ear: External ear normal.  Mouth/Throat: Oropharynx is clear and moist.  Left TM slightly dull. No purulent effusion. Right TM normal. Canals normal. No tragus tenderness. Posterior oropharynx widely patent. No erythema or exudate. Good range of motion of the jaw without trismus. Nasal mucosa slightly boggy and erythematous.  Eyes: EOM are normal. Pupils are equal, round, and reactive to light.  Neck: Neck supple.  Cardiovascular: Normal rate, regular rhythm, normal heart sounds and intact distal pulses.   Pulmonary/Chest: Effort normal and breath sounds normal.  Musculoskeletal: Normal range of motion.  Neurological: She is alert and oriented to person, place, and time. No cranial nerve deficit. She exhibits normal muscle tone. Coordination normal.  Skin: Skin is warm and dry.  Psychiatric: She has a normal mood and affect.     ED Treatments / Results  Labs (all labs ordered are listed, but only abnormal results are displayed) Labs Reviewed - No data to display  EKG  EKG Interpretation None       Radiology No results found.  Procedures Procedures (including critical care time)  Medications Ordered in ED Medications - No data to display   Initial Impression / Assessment and Plan / ED Course  I have reviewed the triage vital signs and the nursing notes.  Pertinent labs & imaging results that were available during my care of the patient were reviewed by me and considered in my medical decision making (see chart for details).      Final Clinical Impressions(s) / ED Diagnoses    Final diagnoses:  Vertigo  Chronic sinusitis, unspecified location  Patient is clinically well with mild vertiginous symptoms. No neurologic dysfunction or headache. She perceives pressure behind her ears. No evidence of otitis media. Patient has subacute and chronic sinusitis\rhinitis. At this time we will add Sudafed to Zyrtec which she takes regularly. We'll also provide meclizine for vertiginous type symptoms. continue Flonase.  New Prescriptions New Prescriptions   CETIRIZINE-PSEUDOEPHEDRINE (ZYRTEC-D) 5-120 MG TABLET    Take 1 tablet by mouth daily.   MECLIZINE (ANTIVERT) 25 MG TABLET    Take 1 tablet (25 mg total) by mouth 3 (three) times daily as needed for dizziness.     Lebron Conners  Donnald GarrePfeiffer, MD 12/17/16 [phone]

## 2016-12-17 NOTE — ED Triage Notes (Signed)
Pt to ER by private vehicle with complaint of bilateral ear pain and dizziness onset yesterday. Pt reports problems with allergies recently. A/o x4.

## 2017-01-03 DIAGNOSIS — J4522 Mild intermittent asthma with status asthmaticus: Secondary | ICD-10-CM | POA: Diagnosis not present

## 2017-01-03 DIAGNOSIS — Z13220 Encounter for screening for lipoid disorders: Secondary | ICD-10-CM | POA: Diagnosis not present

## 2017-01-03 DIAGNOSIS — Z131 Encounter for screening for diabetes mellitus: Secondary | ICD-10-CM | POA: Diagnosis not present

## 2017-01-22 DIAGNOSIS — J4522 Mild intermittent asthma with status asthmaticus: Secondary | ICD-10-CM | POA: Diagnosis not present

## 2017-01-22 DIAGNOSIS — F329 Major depressive disorder, single episode, unspecified: Secondary | ICD-10-CM | POA: Diagnosis not present

## 2017-01-22 DIAGNOSIS — J302 Other seasonal allergic rhinitis: Secondary | ICD-10-CM | POA: Diagnosis not present

## 2017-02-17 DIAGNOSIS — J4522 Mild intermittent asthma with status asthmaticus: Secondary | ICD-10-CM | POA: Diagnosis not present

## 2017-02-17 DIAGNOSIS — F329 Major depressive disorder, single episode, unspecified: Secondary | ICD-10-CM | POA: Diagnosis not present

## 2017-03-05 DIAGNOSIS — Z3042 Encounter for surveillance of injectable contraceptive: Secondary | ICD-10-CM | POA: Diagnosis not present

## 2017-03-05 DIAGNOSIS — Z01419 Encounter for gynecological examination (general) (routine) without abnormal findings: Secondary | ICD-10-CM | POA: Diagnosis not present

## 2017-03-19 DIAGNOSIS — J302 Other seasonal allergic rhinitis: Secondary | ICD-10-CM | POA: Diagnosis not present

## 2017-03-19 DIAGNOSIS — J4522 Mild intermittent asthma with status asthmaticus: Secondary | ICD-10-CM | POA: Diagnosis not present

## 2017-03-19 DIAGNOSIS — F329 Major depressive disorder, single episode, unspecified: Secondary | ICD-10-CM | POA: Diagnosis not present

## 2017-04-05 ENCOUNTER — Emergency Department (HOSPITAL_COMMUNITY): Payer: Medicare Other

## 2017-04-05 ENCOUNTER — Emergency Department (HOSPITAL_COMMUNITY)
Admission: EM | Admit: 2017-04-05 | Discharge: 2017-04-06 | Disposition: A | Discharge status: Home / Self Care | Payer: Medicare Other | Attending: Emergency Medicine | Admitting: Emergency Medicine

## 2017-04-05 ENCOUNTER — Encounter (HOSPITAL_COMMUNITY): Payer: Self-pay

## 2017-04-05 DIAGNOSIS — Z79899 Other long term (current) drug therapy: Secondary | ICD-10-CM | POA: Insufficient documentation

## 2017-04-05 DIAGNOSIS — J189 Pneumonia, unspecified organism: Secondary | ICD-10-CM | POA: Diagnosis not present

## 2017-04-05 DIAGNOSIS — R05 Cough: Secondary | ICD-10-CM | POA: Diagnosis not present

## 2017-04-05 DIAGNOSIS — J45909 Unspecified asthma, uncomplicated: Secondary | ICD-10-CM | POA: Insufficient documentation

## 2017-04-05 DIAGNOSIS — R0602 Shortness of breath: Secondary | ICD-10-CM | POA: Diagnosis not present

## 2017-04-05 DIAGNOSIS — F17210 Nicotine dependence, cigarettes, uncomplicated: Secondary | ICD-10-CM | POA: Insufficient documentation

## 2017-04-05 MED ORDER — METHYLPREDNISOLONE SODIUM SUCC 125 MG IJ SOLR
125.0000 mg | Freq: Once | INTRAMUSCULAR | Status: AC
Start: 2017-04-05 — End: 2017-04-05
  Administered 2017-04-05: 125 mg via INTRAVENOUS
  Filled 2017-04-05: qty 2

## 2017-04-05 MED ORDER — HYDROCOD POLST-CPM POLST ER 10-8 MG/5ML PO SUER
5.0000 mL | Freq: Once | ORAL | Status: AC
  Administered 2017-04-05: 5 mL via ORAL
  Filled 2017-04-05: qty 5

## 2017-04-05 MED ORDER — FUROSEMIDE 10 MG/ML IJ SOLN
40.0000 mg | Freq: Once | INTRAMUSCULAR | Status: AC
  Administered 2017-04-05: 40 mg via INTRAVENOUS
  Filled 2017-04-05: qty 4

## 2017-04-05 MED ORDER — ALBUTEROL SULFATE (2.5 MG/3ML) 0.083% IN NEBU
5.0000 mg | INHALATION_SOLUTION | Freq: Once | RESPIRATORY_TRACT | Status: AC
  Administered 2017-04-05: 5 mg via RESPIRATORY_TRACT
  Filled 2017-04-05: qty 6

## 2017-04-05 NOTE — ED Provider Notes (Signed)
[facility] Provider Note   CSN: [id number] Arrival date & time: 04/05/17  2056     History   Chief Complaint Chief Complaint  Patient presents with  . Shortness of Breath  . Asthma    HPI Stacy Blackwell is a 36 y.o. female.  Patient is a 36 year old female who presents to the emergency department with a complaint of cough and shortness of breath.  The patient has a history of asthma. She's been treated for pneumonia in the past. She suffers from obesity. She is a smoker.  The patient states over the last 2 days that she's been having problems with wheezing and cough and congestion. She has been using her albuterol nebulizer machine at home as well as her inhalers, but she states she is not getting any relief. She has done enough coughing to cause soreness in her chest. She denies any hemoptysis to be reported. There's been no high fevers reported. No unusual rash noted. The patient has not been out of the country recently. His been no recent changes in medications to be reported. Patient denies being around any toxic fumes. The albuterol helps some, but does not resolve the issue. Being in the heat seems to make the condition worse.      Past Medical History:  Diagnosis Date  . Acid reflux   . Asthma   . Depression    doing good, no meds  . Obesity   . Ovarian cyst   . Vertigo     Patient Active Problem List   Diagnosis Date Noted  . NSVD (normal spontaneous vaginal delivery) 01/14/2015  . Premature rupture of membranes 01/13/2015  . Active labor at term 01/13/2015  . Polyhydramnios in third trimester   . [redacted] weeks gestation of pregnancy   . Polyhydramnios in third trimester, antepartum   . [redacted] weeks gestation of pregnancy   . Large for dates   . [redacted] weeks gestation of pregnancy   . Abdominal pain in pregnancy, antepartum   . [redacted] weeks gestation of pregnancy   . Evaluate anatomy not seen on prior sonogram   . Obesity in pregnancy with antepartum  complication   . [redacted] weeks gestation of pregnancy   . Encounter for fetal anatomic survey   . [redacted] weeks gestation of pregnancy   . 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 Allergic rhinitis 07/01/2014  . GERD (gastroesophageal reflux disease) 07/01/2014  . Tobacco use disorder 07/01/2014    Past Surgical History:  Procedure Laterality Date  . CYST EXCISION Left 1987    OB History    Gravida Para Term Preterm AB Living   2 2 2  0 0 2   SAB TAB Ectopic Multiple Live Births   0 0 0 0 2       Home Medications    Prior to Admission medications   Medication Sig Start Date End Date Taking? Authorizing Provider  albuterol (PROVENTIL HFA;VENTOLIN HFA) 108 (90 BASE) MCG/ACT inhaler Inhale 2 puffs into the lungs every 6 (six) hours as needed for wheezing or shortness of breath. 08/02/14   Marny LowensteinWenzel, Julie N, PA-C  albuterol (PROVENTIL HFA;VENTOLIN HFA) 108 (90 Base) MCG/ACT inhaler Inhale 2 puffs into the lungs every 4 (four) hours as needed for wheezing or shortness of breath. 10/26/16   Muthersbaugh, Dahlia ClientHannah, PA-C  albuterol (PROVENTIL) (5 MG/ML) 0.5% nebulizer solution Take 2.5 mg by nebulization every 6 (six) hours as needed for wheezing or shortness of breath.    [provider]  cetirizine-pseudoephedrine (  ZYRTEC-D) 5-120 MG tablet Take 1 tablet by mouth daily. 12/17/16   Arby Barrette, MD  meclizine (ANTIVERT) 25 MG tablet Take 1 tablet (25 mg total) by mouth 3 (three) times daily as needed for dizziness. 12/17/16   Arby Barrette, MD  medroxyPROGESTERone (DEPO-PROVERA) 150 MG/ML injection Inject 1 mL (150 mg total) into the muscle every 3 (three) months. 01/15/15   Montez Morita, CNM  predniSONE (DELTASONE) 20 MG tablet Take 2 tablets (40 mg total) by mouth daily. 10/26/16   Muthersbaugh, Dahlia Client, PA-C  pseudoephedrine (SUDAFED) 30 MG tablet Take 30 mg by mouth every 4 (four) hours as needed for congestion.    [provider]    Family History Family History  Problem Relation Age  of Onset  . Asthma Mother   . Diabetes Mother   . Heart disease Mother        CHF  . Asthma Sister   . Diabetes Sister   . Heart disease Sister   . Hearing loss Neg Hx     Social History Social History  Substance Use Topics  . Smoking status: Current Some Day Smoker    Packs/day: 0.25    Years: 10.00    Types: Cigarettes  . Smokeless tobacco: Never Used  . Alcohol use No     Allergies   Amoxicillin and Hydrocodone   Review of Systems Review of Systems  Constitutional: Negative for activity change and appetite change.  HENT: Positive for congestion. Negative for ear discharge, ear pain, facial swelling, nosebleeds, rhinorrhea, sneezing and tinnitus.   Eyes: Negative for photophobia, pain and discharge.  Respiratory: Positive for cough and shortness of breath. Negative for choking and wheezing.        Chest wall pain  Cardiovascular: Negative for chest pain, palpitations and leg swelling.  Gastrointestinal: Negative for abdominal pain, blood in stool, constipation, diarrhea, nausea and vomiting.  Genitourinary: Negative for difficulty urinating, dysuria, flank pain, frequency and hematuria.  Musculoskeletal: Positive for myalgias. Negative for back pain, gait problem and neck pain.  Skin: Negative for color change, rash and wound.  Neurological: Negative for dizziness, seizures, syncope, facial asymmetry, speech difficulty, weakness and numbness.  Hematological: Negative for adenopathy. Does not bruise/bleed easily.  Psychiatric/Behavioral: Negative for agitation, confusion, hallucinations, self-injury and suicidal ideas. The patient is not nervous/anxious.      Physical Exam Updated Vital Signs BP 136/81   Pulse (!) 117   Temp 99 F (37.2 C)   Resp (!) 22   SpO2 97%   Physical Exam  Constitutional: Vital signs are normal. She appears well-developed and well-nourished. She is active.  HENT:  Head: Normocephalic and atraumatic.  Right Ear: Tympanic membrane,  external ear and ear canal normal.  Left Ear: Tympanic membrane, external ear and ear canal normal.  Nose: Nose normal.  Mouth/Throat: Uvula is midline, oropharynx is clear and moist and mucous membranes are normal.  Eyes: Conjunctivae, EOM and lids are normal. Pupils are equal, round, and reactive to light.  Neck: Trachea normal, normal range of motion and phonation normal. Neck supple. Carotid bruit is not present.  Cardiovascular: Normal rate, regular rhythm and normal pulses.   Pulmonary/Chest:  There is are wheezing noted at the bedside. The patient has symmetrical rise and fall of the chest, but has difficulty speaking in complete sentences at this time.  Abdominal: Soft. Normal appearance and bowel sounds are normal.  Lymphadenopathy:       Head (right side): No submental, no preauricular and no posterior  auricular adenopathy present.       Head (left side): No submental, no preauricular and no posterior auricular adenopathy present.    She has no cervical adenopathy.  Neurological: She is alert. She has normal strength. No cranial nerve deficit or sensory deficit. GCS eye subscore is 4. GCS verbal subscore is 5. GCS motor subscore is 6.  Skin: Skin is warm and dry.  Psychiatric: Her speech is normal.     ED Treatments / Results  Labs (all labs ordered are listed, but only abnormal results are displayed) Labs Reviewed - No data to display  EKG  EKG Interpretation  Date/Time:  Saturday April 05 2017 21:00:53 EDT Ventricular Rate:  104 PR Interval:  134 QRS Duration: 84 QT Interval:  332 QTC Calculation: 436 R Axis:   51 Text Interpretation:  Sinus tachycardia Nonspecific T wave abnormality Abnormal ECG No significant change since last tracing Confirmed by Richardean Canal [phone]) on 04/05/2017 11:01:14 PM       Radiology Dg Chest 2 View  Result Date: 04/05/2017 CLINICAL DATA:  Acute onset of cough and shortness of breath. Initial encounter. EXAM: CHEST  2 VIEW COMPARISON:   Chest radiograph performed 10/26/2016 FINDINGS: The lungs are well-aerated. Vascular congestion is noted. Increased interstitial markings raise concern for mild interstitial edema. There is no evidence of pleural effusion or pneumothorax. The heart is borderline enlarged. No acute osseous abnormalities are seen. IMPRESSION: Vascular congestion and borderline cardiomegaly. Increased interstitial markings raise concern for mild interstitial edema. Electronically Signed   By: Roanna Raider M.D.   On: 04/05/2017 21:31    Procedures Procedures (including critical care time)  Medications Ordered in ED Medications - No data to display   Initial Impression / Assessment and Plan / ED Course  I have reviewed the triage vital signs and the nursing notes.  Pertinent labs & imaging results that were available during my care of the patient were reviewed by me and considered in my medical decision making (see chart for details).      Final Clinical Impressions(s) / ED Diagnoses MDM Heart rate is elevated at 117, respiratory rate 22. The remainder the vital signs are within normal limits. Chest x-ray shows vascular congestion and borderline cardiomegaly. There is increased interstitial markings present.  Patient treated with albuterol, Decadron, and IV Lasix. Patient diuresing well from the attic the Lasix.  Recheck. Wheezing much improved, but still present. Pt tachycardic.  Recheck. Wheezing improved. Pt speaking in complete sentences. Pt remains tachycardic.   Will try Xopenex  And IV fluids. Pt care to be completed by Victorino Dike, [facility].   Final diagnoses:  Multifocal pneumonia    New Prescriptions New Prescriptions   No medications on file     Duayne Cal 04/07/17 0827    Glynn Octave, MD 04/07/17 1742

## 2017-04-05 NOTE — ED Triage Notes (Signed)
Per EMS: Pt complaining of cough and SOB x 2 days. EMS gave 5mg  albuterol and 0.5mg  atrovent. Pt with no wheezing at triage, states feels much better. Pt states using home albuterol, no improvement. Pt states hx of bronchitis.

## 2017-04-05 NOTE — ED Notes (Signed)
Pt reports asthma problems for 2 days. Pt states that she is not able to catch her breath, stop coughing, and her inhaler is not working.

## 2017-04-06 ENCOUNTER — Emergency Department (HOSPITAL_COMMUNITY): Payer: Medicare Other

## 2017-04-06 ENCOUNTER — Encounter (HOSPITAL_COMMUNITY): Payer: Self-pay | Admitting: Radiology

## 2017-04-06 DIAGNOSIS — J189 Pneumonia, unspecified organism: Secondary | ICD-10-CM | POA: Diagnosis not present

## 2017-04-06 DIAGNOSIS — R0602 Shortness of breath: Secondary | ICD-10-CM | POA: Diagnosis not present

## 2017-04-06 LAB — BRAIN NATRIURETIC PEPTIDE: B NATRIURETIC PEPTIDE 5: 6.8 pg/mL (ref 0.0–100.0)

## 2017-04-06 LAB — I-STAT CHEM 8, ED
BUN: 5 mg/dL — ABNORMAL LOW (ref 6–20)
CALCIUM ION: 1.09 mmol/L — AB (ref 1.15–1.40)
CHLORIDE: 103 mmol/L (ref 101–111)
Creatinine, Ser: 0.8 mg/dL (ref 0.44–1.00)
Glucose, Bld: 133 mg/dL — ABNORMAL HIGH (ref 65–99)
HEMATOCRIT: 40 % (ref 36.0–46.0)
Hemoglobin: 13.6 g/dL (ref 12.0–15.0)
Potassium: 3.5 mmol/L (ref 3.5–5.1)
SODIUM: 137 mmol/L (ref 135–145)
TCO2: 20 mmol/L (ref 0–100)

## 2017-04-06 LAB — I-STAT TROPONIN, ED: TROPONIN I, POC: 0 ng/mL (ref 0.00–0.08)

## 2017-04-06 LAB — D-DIMER, QUANTITATIVE (NOT AT ARMC): D DIMER QUANT: 0.58 ug{FEU}/mL — AB (ref 0.00–0.50)

## 2017-04-06 MED ORDER — ALBUTEROL SULFATE HFA 108 (90 BASE) MCG/ACT IN AERS: 1.0000 | INHALATION_SPRAY | Freq: Four times a day (QID) | RESPIRATORY_TRACT | 0 refills | Status: AC | PRN

## 2017-04-06 MED ORDER — DOXYCYCLINE HYCLATE 100 MG PO CAPS: 100.0000 mg | ORAL_CAPSULE | Freq: Two times a day (BID) | ORAL | 0 refills | Status: DC

## 2017-04-06 MED ORDER — LEVALBUTEROL HCL 0.63 MG/3ML IN NEBU
0.6300 mg | INHALATION_SOLUTION | Freq: Once | RESPIRATORY_TRACT | Status: AC
  Administered 2017-04-06: 0.63 mg via RESPIRATORY_TRACT
  Filled 2017-04-06: qty 3

## 2017-04-06 MED ORDER — IOPAMIDOL (ISOVUE-370) INJECTION 76%
INTRAVENOUS | Status: AC
  Administered 2017-04-06: 100 mL
  Filled 2017-04-06: qty 100

## 2017-04-06 MED ORDER — SODIUM CHLORIDE 0.9 % IV BOLUS (SEPSIS)
1000.0000 mL | Freq: Once | INTRAVENOUS | Status: AC
Start: 2017-04-06 — End: 2017-04-06
  Administered 2017-04-06: 1000 mL via INTRAVENOUS

## 2017-04-06 MED ORDER — PREDNISONE 20 MG PO TABS: 40.0000 mg | ORAL_TABLET | Freq: Every day | ORAL | 0 refills | Status: DC

## 2017-04-06 NOTE — Discharge Instructions (Signed)
Please take all of your antibiotics until finished! Continue using inhaler as needed - I have provided you with a refill. Prednisone daily as directed. Follow up with your doctor in regards to your hospital visit. Please call Monday morning to schedule this follow up appointment. Return to the emergency department if symptoms worsen, become progressive, or become more concerning.  Pneumonia, Adult Pneumonia is an infection of the lungs.   CAUSES Pneumonia may be caused by bacteria or a virus. Usually, these infections are caused by breathing infectious particles into the lungs (respiratory tract).  SYMPTOMS  Cough.  Fever.  Chest pain.  Increased rate of breathing.  Wheezing.  Mucus production.   DIAGNOSIS  If you have the common symptoms of pneumonia, your caregiver will typically confirm the diagnosis with a chest X-ray. The X-ray will show an abnormality in the lung (pulmonary infiltrate) if you have pneumonia. Other tests of your blood, urine, or sputum may be done to find the specific cause of your pneumonia. Your caregiver may also do tests (blood gases or pulse oximetry) to see how well your lungs are working.  TREATMENT  Some forms of pneumonia may be spread to other people when you cough or sneeze. You may be asked to wear a mask before and during your exam. Pneumonia that is caused by bacteria is treated with antibiotic medicine. Pneumonia that is caused by the influenza virus may be treated with an antiviral medicine. Most other viral infections must run their course. These infections will not respond to antibiotics.  HOME CARE INSTRUCTIONS  Cough suppressants may be used if you are losing too much rest. However, coughing protects you by clearing your lungs. You should avoid using cough suppressants if you can.  Your healthcare provider may have prescribed medicine if he or she thinks your pneumonia is caused by a bacteria or influenza. Finish your medicine even if you start to  feel better.  Do not smoke. Smoking is a common cause of bronchitis and can contribute to pneumonia. If you are a smoker and continue to smoke, your cough may last several weeks after your pneumonia has cleared.  A cold steam vaporizer or humidifier in your room or home may help loosen mucus.  Coughing is often worse at night. Sleeping in a semi-upright position in a recliner or using a couple pillows under your head will help with this.  Get rest as you feel it is needed. Your body will usually let you know when you need to rest.   SEEK IMMEDIATE MEDICAL CARE IF:  Your illness becomes worse. This is especially true if you are elderly or weakened from any other disease.  You cannot control your cough with suppressants and are losing sleep.  You begin coughing up blood.  You develop pain which is getting worse or is uncontrolled with medicines.  You have a fever.  Any of the symptoms which initially brought you in for treatment are getting worse rather than better.  You develop shortness of breath or chest pain.   MAKE SURE YOU:  Understand these instructions.  Will watch your condition.  Will get help right away if you are not doing well or get worse.

## 2017-04-06 NOTE — ED Provider Notes (Signed)
Care assumed from previous provider PA WebsterBryant. Please see note for further details. Briefly, patient is a 36 y.o. female who presented to ED for wheezing / shortness of breath. Patient initially was speaking in broken sentences and tachypneic. Given steroids and neb and on recheck by prior PA, lung sounds were improving. She was able to speak in full sentences. Still wheezing. She was given 2nd neb treatment. Given tachycardia, given xopenex. Also given IV fluids. Case discussed, plan agreed upon. Will recheck after neb, monitor vital signs.   Patient re-evaluated and feels much improved. She was resting comfortably when I entered the room. She is speaking in full sentences and feels improved. Lungs with faint expiratory wheezing. HR still 120's-130's. I discussed case with attending, Dr. Manus Gunningancour, who recommends obtaining d-dimer. D-dimer elevated at 0.58 - will proceed with CT.   CT angio shows no evidence of PE. Does show bilateral opacification concerning for PNA. On re-evaluation, patient able to ambulate and maintain O2. No hypoxia or shortness of breath. Feels comfortable with discharge home. Patient and imaging again discussed with Dr. Manus Gunningancour who recommends treatment with doxycycline. Rx for doxy, steroid burst and refill of home inhaler given. PCP follow up strongly encouraged. Reasons to return to ER discussed and all questions answered.    Ward, Chase PicketJaime Pilcher, PA-C 04/06/17 [id number]    Glynn Octaveancour, Stephen, MD 04/06/17 [phone]

## 2017-04-21 ENCOUNTER — Emergency Department (HOSPITAL_COMMUNITY): Payer: Medicare Other

## 2017-04-21 ENCOUNTER — Emergency Department (HOSPITAL_COMMUNITY)
Admission: EM | Admit: 2017-04-21 | Discharge: 2017-04-21 | Disposition: A | Discharge status: Home / Self Care | Payer: Medicare Other | Attending: Emergency Medicine | Admitting: Emergency Medicine

## 2017-04-21 ENCOUNTER — Encounter (HOSPITAL_COMMUNITY): Payer: Self-pay | Admitting: Emergency Medicine

## 2017-04-21 DIAGNOSIS — M5440 Lumbago with sciatica, unspecified side: Secondary | ICD-10-CM | POA: Diagnosis not present

## 2017-04-21 DIAGNOSIS — M545 Low back pain: Secondary | ICD-10-CM | POA: Diagnosis not present

## 2017-04-21 DIAGNOSIS — F17210 Nicotine dependence, cigarettes, uncomplicated: Secondary | ICD-10-CM | POA: Diagnosis not present

## 2017-04-21 DIAGNOSIS — J45909 Unspecified asthma, uncomplicated: Secondary | ICD-10-CM | POA: Insufficient documentation

## 2017-04-21 DIAGNOSIS — M5442 Lumbago with sciatica, left side: Secondary | ICD-10-CM | POA: Diagnosis not present

## 2017-04-21 LAB — URINALYSIS, ROUTINE W REFLEX MICROSCOPIC
BILIRUBIN URINE: NEGATIVE
Glucose, UA: NEGATIVE mg/dL
Hgb urine dipstick: NEGATIVE
KETONES UR: NEGATIVE mg/dL
LEUKOCYTES UA: NEGATIVE
NITRITE: NEGATIVE
PH: 6 (ref 5.0–8.0)
PROTEIN: NEGATIVE mg/dL
Specific Gravity, Urine: 1.015 (ref 1.005–1.030)

## 2017-04-21 LAB — POC URINE PREG, ED: Preg Test, Ur: NEGATIVE

## 2017-04-21 MED ORDER — IBUPROFEN 600 MG PO TABS: 600.0000 mg | ORAL_TABLET | Freq: Four times a day (QID) | ORAL | 0 refills | Status: DC | PRN

## 2017-04-21 MED ORDER — HYDROXYZINE HCL 25 MG PO TABS: 25.0000 mg | ORAL_TABLET | Freq: Once | ORAL | Status: DC

## 2017-04-21 MED ORDER — CYCLOBENZAPRINE HCL 10 MG PO TABS
10.0000 mg | ORAL_TABLET | Freq: Once | ORAL | Status: AC
  Administered 2017-04-21: 10 mg via ORAL
  Filled 2017-04-21: qty 1

## 2017-04-21 MED ORDER — CYCLOBENZAPRINE HCL 10 MG PO TABS: 10.0000 mg | ORAL_TABLET | Freq: Two times a day (BID) | ORAL | 0 refills | Status: DC | PRN

## 2017-04-21 MED ORDER — PREDNISONE 20 MG PO TABS: 20.0000 mg | ORAL_TABLET | Freq: Once | ORAL | Status: DC

## 2017-04-21 MED ORDER — IBUPROFEN 400 MG PO TABS
600.0000 mg | ORAL_TABLET | Freq: Once | ORAL | Status: AC
  Administered 2017-04-21: 600 mg via ORAL
  Filled 2017-04-21: qty 1

## 2017-04-21 NOTE — ED Triage Notes (Signed)
Pt reports new onset "tailbone pain" yesterday upon waking, denies any trauma, falls, injury. Pt reports unable to have BM d/t pain.

## 2017-04-21 NOTE — ED Provider Notes (Signed)
[facility] Emergency Department Provider Note By signing my name below, I, Sonum Patel, attest that this documentation has been prepared under the direction and in the presence of Wille Glaserobyn Wokeck, NP. Electronically Signed: Sonum Patel, Neurosurgeoncribe. 04/21/17. 10:37 AM.  ED Clinical Impression  Acute bilateral low back pain with sciatica, sciatica laterality unspecified  History   Chief Complaint Tailbone Pain   HPI  Patient is a 36 y.o. female presenting to the ED left lower back pain, onset yesterday, describes as tight, worse with twisting and bending, constant, with radiation down left posterior thigh, has tried Tylenol without significant relief. Denies recent trauma, fall, or known injury to area. Denies fevers, chills, unexplained weight loss, dizziness, vision or gait changes, CP, SOB, cough, abdominal pain, n/v/d, dysuria, hematuria, hx of kidney stones, bladder or bowel incontinence, saddle anesthesia, extremity numbness/tingling/weakness, or any additional concerns. No anticoagulant use. No hx of IVDU. No previous back surgeries.   Past Medical History:  Diagnosis Date  . Acid reflux   . Asthma   . Depression    doing good, no meds  . Obesity   . Ovarian cyst   . Vertigo     Past Surgical History:  Procedure Laterality Date  . CYST EXCISION Left 1987    Current Outpatient Rx  . Order #: [id number] Class: Print  . Order #: [id number] Class: Historical Med  . Order #: [id number] Class: Normal  . Order #: [id number] Class: Print  . Order #: [id number] Class: Print  . Order #: [id number] Class: Print  . Order #: [id number] Class: Print  . Order #: [id number] Class: Print  . Order #: [id number] Class: Print    Allergies Amoxicillin and Hydrocodone  Family History  Problem Relation Age of Onset  . Asthma Mother   . Diabetes Mother   . Heart disease Mother        CHF  . Asthma Sister   . Diabetes Sister   . Heart disease Sister   . Hearing loss Neg Hx     Social History Social  History  Substance Use Topics  . Smoking status: Current Some Day Smoker    Packs/day: 0.25    Years: 10.00    Types: Cigarettes  . Smokeless tobacco: Never Used  . Alcohol use No    Review of Systems  Constitutional: Negative for fever, chills, or unexplained weight loss. Eyes: Negative for visual changes. Cardiovascular: Negative for chest pain, palpitations, or extremity swelling. Respiratory: Negative for shortness of breath or cough. Gastrointestinal: Negative for abdominal pain, nausea, vomiting, or diarrhea. Genitourinary: Negative for dysuria, urinary frequency, or hematuria. Musculoskeletal: Positive back pain. Negative for extremity pain/swelling. Skin: Negative for rash. Neurological: Negative for headaches, dizziness, focal weakness, or numbness/tingling.  Physical Exam   VITAL SIGNS:   ED Triage Vitals  Enc Vitals Group     BP 04/21/17 0908 (!) 112/92     Pulse Rate 04/21/17 0907 (!) 117     Resp 04/21/17 0907 20     Temp 04/21/17 0907 98.3 F (36.8 C)     Temp Source 04/21/17 0907 Oral     SpO2 04/21/17 0907 99 %     Weight 04/21/17 0907 282 lb (127.9 kg)     Height 04/21/17 0907 5\' 2"  (1.575 m)     Head Circumference --      Peak Flow --      Pain Score 04/21/17 0906 10     Pain Loc --      Pain Edu? --      Excl.  in GC? --     Constitutional: Alert and oriented. Well appearing and in no respiratory apparent distress. Eyes: PERRL, EOMI, Conjunctivae normal ENT      Head: Normocephalic and atraumatic.      Ears: TM intact bilaterally without erythema or effusion, auditory canals normal.      Mouth/Throat: Mucous membranes are moist. Oropharynx without erythema or exudate. Normal voice, handling secretions normally.      Neck: Supple, no nuchal signs, full active ROM of neck.  Cardiovascular: Normal S1 S2, regular rhythm, normal rate. Normal and symmetric distal pulses are present in all extremities. Respiratory: Breath sounds clear and equal  bilaterally. No wheezes, rales, or rhonchi. Normal respiratory effort.  Gastrointestinal: Abdomen soft and nontender. No rebound or guarding. There is no CVA tenderness. Back: +L lumbosacral paraspinous muscles ttp, no midline tenderness, no stepoff. Negative SLR bilaterally. Patellar and achilles reflexes 2+, sharp and dull sensation grossly intact to BLE, dorsalis pedis and posterior tibial pulses 2+, cap refill <2sec. No rash noted. No CVAT. Gait steady with ambulating around room and pod.  Musculoskeletal: Nontender with normal range of motion in all extremities.      Right lower leg: No tenderness or edema.      Left lower leg: No tenderness or edema. Neurologic: Speech clear. Alert and appropriate, no gross focal neurologic deficits are appreciated. Gait steady with ambulation. Equal strength in all four extremities. Extremities neurovascularly intact.  Skin: Skin is warm, dry, and intact. Psychiatric: Mood and affect are normal. Speech and behavior are normal.  Labs   Labs Reviewed  URINALYSIS, ROUTINE W REFLEX MICROSCOPIC  POC URINE PREG, ED    Radiology   DG Sacrum/Coccyx  Final Result    DG Lumbar Spine 2-3 Views  Final Result       ED Course, Assessment and Plan   Pt is a 36 y/o F who presents to ED for low back pain likely due to acute muscle strain/spasm versus lumbar radiculopathy. No acute neuro deficits. Doubt vertebral fracture, ruptured abdominal aortic aneurysm or dissection, pyelonephritis, ureteral calculi, epidural abscess/hematoma, cauda equina or acute spinal cord injury at this time. Gait steady while ambulating around pod. Will get UA and imaging to assess disc spacing.  7:09 AM UA negative, repeat VSS. Xray without acute bony abnormality of lumbar spine or sacrum. Discsused results with patient.  7:09 AM Discussed results, discharge instructions, rx and safety, return precautions, and follow up. Pt verbalizes understanding using verbal teachback and  agrees with plan, denies any additional concerns.    Previous chart, nursing notes, and vital signs reviewed.    Pertinent labs & imaging results that were available during my care of the patient were reviewed by me and considered in my medical decision making (see chart for details).  I personally performed the services described in this documentation, which was scribed in my presence. The recorded information has been reviewed and is accurate.   Sion Reinders, [place], NP 04/23/17 0981    Linwood Dibbles, MD 04/23/17 [phone]

## 2017-04-21 NOTE — Discharge Instructions (Signed)
Please take Ibuprofen as prescribed as needed for pain--take with food to prevent GI upset. Please take Flexeril as prescribed as needed for muscle spasm--caution-may cause sedation-do not drink alcohol, drive, or operate machinery while taking. Apply warm moist heat (such as a warm washcloth) to area for 20 minutes four times a day. Call to schedule a follow up appointment with your primary care doctor within 5 days.  Return to ER if you experience fever >100.4, chills, unexplained weight loss, dizziness, vision or gait changes, chest pain, shortness of breath, abdominal pain, nausea/vomiting/diarrhea, bladder or bowel dysfunction, numbness to groin, extremity numbness/tingling/weakness, worsening symptoms, or any additional concerns.

## 2017-06-02 DIAGNOSIS — Z3042 Encounter for surveillance of injectable contraceptive: Secondary | ICD-10-CM | POA: Diagnosis not present

## 2017-06-02 DIAGNOSIS — Z3009 Encounter for other general counseling and advice on contraception: Secondary | ICD-10-CM | POA: Diagnosis not present

## 2017-06-13 DIAGNOSIS — J4522 Mild intermittent asthma with status asthmaticus: Secondary | ICD-10-CM | POA: Diagnosis not present

## 2017-06-13 DIAGNOSIS — F329 Major depressive disorder, single episode, unspecified: Secondary | ICD-10-CM | POA: Diagnosis not present

## 2017-06-13 DIAGNOSIS — J209 Acute bronchitis, unspecified: Secondary | ICD-10-CM | POA: Diagnosis not present

## 2017-09-03 DIAGNOSIS — Z3009 Encounter for other general counseling and advice on contraception: Secondary | ICD-10-CM | POA: Diagnosis not present

## 2017-09-03 DIAGNOSIS — Z3042 Encounter for surveillance of injectable contraceptive: Secondary | ICD-10-CM | POA: Diagnosis not present

## 2017-10-29 ENCOUNTER — Ambulatory Visit (HOSPITAL_COMMUNITY)
Admission: EM | Admit: 2017-10-29 | Discharge: 2017-10-29 | Disposition: A | Discharge status: Home / Self Care | Payer: Medicare Other | Attending: Family Medicine | Admitting: Family Medicine

## 2017-10-29 ENCOUNTER — Encounter (HOSPITAL_COMMUNITY): Payer: Self-pay | Admitting: Emergency Medicine

## 2017-10-29 ENCOUNTER — Other Ambulatory Visit: Payer: Self-pay

## 2017-10-29 DIAGNOSIS — R062 Wheezing: Secondary | ICD-10-CM | POA: Diagnosis not present

## 2017-10-29 DIAGNOSIS — B9789 Other viral agents as the cause of diseases classified elsewhere: Secondary | ICD-10-CM | POA: Diagnosis not present

## 2017-10-29 DIAGNOSIS — J069 Acute upper respiratory infection, unspecified: Secondary | ICD-10-CM

## 2017-10-29 DIAGNOSIS — R05 Cough: Secondary | ICD-10-CM | POA: Diagnosis not present

## 2017-10-29 MED ORDER — IPRATROPIUM-ALBUTEROL 0.5-2.5 (3) MG/3ML IN SOLN
RESPIRATORY_TRACT | Status: AC
  Filled 2017-10-29: qty 3

## 2017-10-29 MED ORDER — BENZONATATE 100 MG PO CAPS: 100.0000 mg | ORAL_CAPSULE | Freq: Three times a day (TID) | ORAL | 0 refills | Status: DC

## 2017-10-29 MED ORDER — IPRATROPIUM-ALBUTEROL 0.5-2.5 (3) MG/3ML IN SOLN
3.0000 mL | Freq: Once | RESPIRATORY_TRACT | Status: AC
  Administered 2017-10-29: 3 mL via RESPIRATORY_TRACT

## 2017-10-29 NOTE — Discharge Instructions (Signed)
You likely having a viral upper respiratory infection. We recommended symptom control. I expect your symptoms to start improving in the next 1-2 weeks.  ° °1. Take a daily allergy pill/anti-histamine like Zyrtec, Claritin, or Store brand consistently for 2 weeks ° °2. For congestion you may try an oral decongestant like Mucinex or sudafed. You may also try intranasal flonase nasal spray or saline irrigations (neti pot, sinus cleanse) ° °3. For your sore throat you may try cepacol lozenges, salt water gargles, throat spray. Treatment of congestion may also help your sore throat. ° °4. For cough you may try Robitussen, Mucinex DM ° °5. Take Tylenol or Ibuprofen to help with pain/inflammation ° °6. Stay hydrated, drink plenty of fluids to keep throat coated and less irritated ° °Honey Tea °For cough/sore throat try using a honey-based tea. Use 3 teaspoons of honey with juice squeezed from half lemon. Place shaved pieces of ginger into 1/2-1 cup of water and warm over stove top. Then mix the ingredients and repeat every 4 hours as needed. °

## 2017-10-29 NOTE — ED Provider Notes (Signed)
[facility]    CSN: [id number] Arrival date & time: 10/29/17  1331     History   Chief Complaint Chief Complaint  Patient presents with  . Cough    HPI Aram BeechamStephanie R Vahle is a 37 y.o. female history of asthma and smoker, Patient is presenting with URI symptoms- congestion, cough, sore throat. Patient's main complaints are cough and wheezing. Symptoms have been going on for 2 days. Patient has tried has nebulizers at home and ibuprofen. Stopped smoking temporarily for the past 2 days. Denies fever, nausea, vomiting, diarrhea. Denies chest pain.   HPI  Past Medical History:  Diagnosis Date  . Acid reflux   . Asthma   . Depression    doing good, no meds  . Obesity   . Ovarian cyst   . Vertigo     Patient Active Problem List   Diagnosis Date Noted  . NSVD (normal spontaneous vaginal delivery) 01/14/2015  . Premature rupture of membranes 01/13/2015  . Active labor at term 01/13/2015  . Polyhydramnios in third trimester   . [redacted] weeks gestation of pregnancy   . Polyhydramnios in third trimester, antepartum   . [redacted] weeks gestation of pregnancy   . Large for dates   . [redacted] weeks gestation of pregnancy   . Abdominal pain in pregnancy, antepartum   . [redacted] weeks gestation of pregnancy   . Evaluate anatomy not seen on prior sonogram   . Obesity in pregnancy with antepartum complication   . [redacted] weeks gestation of pregnancy   . Encounter for fetal anatomic survey   . [redacted] weeks gestation of pregnancy   . 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 Allergic rhinitis 07/01/2014  . GERD (gastroesophageal reflux disease) 07/01/2014  . Tobacco use disorder 07/01/2014    Past Surgical History:  Procedure Laterality Date  . CYST EXCISION Left 1987    OB History    Gravida Para Term Preterm AB Living   2 2 2  0 0 2   SAB TAB Ectopic Multiple Live Births   0 0 0 0 2       Home Medications    Prior to Admission medications   Medication Sig Start Date End Date Taking? Authorizing  Provider  albuterol (PROVENTIL HFA;VENTOLIN HFA) 108 (90 Base) MCG/ACT inhaler Inhale 1-2 puffs into the lungs every 6 (six) hours as needed for wheezing or shortness of breath. 04/06/17  Yes Ward, Chase PicketJaime Pilcher, PA-C  cetirizine-pseudoephedrine (ZYRTEC-D) 5-120 MG tablet Take 1 tablet by mouth daily. 12/17/16  Yes Arby BarrettePfeiffer, Marcy, MD  medroxyPROGESTERone (DEPO-PROVERA) 150 MG/ML injection Inject 1 mL (150 mg total) into the muscle every 3 (three) months. 01/15/15  Yes Montez MoritaLawson, Marie D, CNM  benzonatate (TESSALON) 100 MG capsule Take 1 capsule (100 mg total) by mouth every 8 (eight) hours. 10/29/17   Harlean Regula C, PA-C  cyclobenzaprine (FLEXERIL) 10 MG tablet Take 1 tablet (10 mg total) by mouth 2 (two) times daily as needed for muscle spasms. 04/21/17   Wojeck, Hinton Dyerobyn K, NP  fexofenadine (ALLEGRA) 30 MG tablet Take 30 mg by mouth daily.     [provider]  ibuprofen (ADVIL,MOTRIN) 600 MG tablet Take 1 tablet (600 mg total) by mouth every 6 (six) hours as needed (pain). 04/21/17   Wojeck, Hinton Dyerobyn K, NP    Family History Family History  Problem Relation Age of Onset  . Asthma Mother   . Diabetes Mother   . Heart disease Mother        CHF  .  Asthma Sister   . Diabetes Sister   . Heart disease Sister   . Hearing loss Neg Hx     Social History Social History   Tobacco Use  . Smoking status: Current Some Day Smoker    Packs/day: 0.25    Years: 10.00    Pack years: 2.50    Types: Cigarettes  . Smokeless tobacco: Never Used  Substance Use Topics  . Alcohol use: No  . Drug use: No     Allergies   Amoxicillin and Hydrocodone   Review of Systems Review of Systems  Constitutional: Negative for chills, fatigue and fever.  HENT: Positive for congestion, rhinorrhea and sore throat. Negative for ear pain, sinus pressure and trouble swallowing.   Respiratory: Positive for cough, shortness of breath and wheezing. Negative for chest tightness.   Cardiovascular: Negative for chest  pain.  Gastrointestinal: Negative for abdominal pain, nausea and vomiting.  Musculoskeletal: Negative for myalgias.  Skin: Negative for rash.  Neurological: Negative for dizziness, light-headedness and headaches.     Physical Exam Triage Vital Signs ED Triage Vitals  Enc Vitals Group     BP 10/29/17 1400 119/61     Pulse Rate 10/29/17 1400 96     Resp 10/29/17 1400 18     Temp 10/29/17 1400 97.8 F (36.6 C)     Temp src --      SpO2 10/29/17 1400 100 %     Weight --      Height --      Head Circumference --      Peak Flow --      Pain Score 10/29/17 1401 9     Pain Loc --      Pain Edu? --      Excl. in GC? --    No data found.  Updated Vital Signs BP 119/61   Pulse 96   Temp 97.8 F (36.6 C)   Resp 18   SpO2 100%    Physical Exam  Constitutional: She is oriented to person, place, and time. She appears well-developed and well-nourished. No distress.  HENT:  Head: Normocephalic and atraumatic.  Right Ear: Tympanic membrane and ear canal normal.  Left Ear: Tympanic membrane and ear canal normal.  Nose: Nose normal.  Mouth/Throat: Uvula is midline and mucous membranes are normal. No trismus in the jaw. No uvula swelling. Posterior oropharyngeal erythema present. No tonsillar exudate.  Eyes: Conjunctivae are normal.  Neck: Neck supple.  Cardiovascular: Normal rate and regular rhythm.  No murmur heard. Pulmonary/Chest: Effort normal. No respiratory distress. She has wheezes.  Coarse Wheezing throughout lungs bilaterally  Abdominal: Soft. There is no tenderness.  Musculoskeletal: She exhibits no edema.  Neurological: She is alert and oriented to person, place, and time.  Skin: Skin is warm and dry.  Psychiatric: She has a normal mood and affect.  Nursing note and vitals reviewed.    UC Treatments / Results  Labs (all labs ordered are listed, but only abnormal results are displayed) Labs Reviewed - No data to display  EKG  EKG Interpretation None        Radiology No results found.  Procedures Procedures (including critical care time)  Medications Ordered in UC Medications  ipratropium-albuterol (DUONEB) 0.5-2.5 (3) MG/3ML nebulizer solution 3 mL (3 mLs Nebulization Given 10/29/17 1450)     Initial Impression / Assessment and Plan / UC Course  I have reviewed the triage vital signs and the nursing notes.  Pertinent labs & imaging results  that were available during my care of the patient were reviewed by me and considered in my medical decision making (see chart for details).    Improved wheezing after breathing treatment, wheezing less coarse but still diffuse.  Patient presents with symptoms likely from a viral upper respiratory infection complicated by asthma. Differential includes bacterial pneumonia, sinusitis, allergic rhinitis, acute bronchitis vs. chronic. Do not suspect underlying cardiopulmonary process. Symptoms seem unlikely related to ACS, CHF or COPD exacerbations, pneumonia, pneumothorax. Patient is nontoxic appearing and not in need of emergent medical intervention.  Advised symptomatic control, tessalon for cough. Deferred steroids today given short course of symptoms. Advised to use albuterol inhaler and nebulizers as prescribed. May want to do nebulizers every 4-6 hours for first 1-2 days. Recommended symptom control with over the counter medications: Daily oral anti-histamine, Oral decongestant or IN corticosteroid, saline irrigations, cepacol lozenges, Robitussin, Delsym, honey tea.  Return if symptoms fail to improve in 1-2 weeks or you develop shortness of breath, chest pain, severe headache. Patient states understanding and is agreeable.    Final Clinical Impressions(s) / UC Diagnoses   Final diagnoses:  Viral URI with cough    ED Discharge Orders        Ordered    benzonatate (TESSALON) 100 MG capsule  Every 8 hours     10/29/17 1512       Controlled Substance Prescriptions [place] Controlled  Substance Registry consulted? Not Applicable   Lew Dawes, [place] 10/29/17 2042

## 2017-10-29 NOTE — ED Triage Notes (Signed)
Pt c/o stuffy nose, sinus congestion, chest congestion, coughing x2 days.

## 2017-11-13 ENCOUNTER — Encounter (HOSPITAL_COMMUNITY): Payer: Self-pay | Admitting: *Deleted

## 2017-11-13 ENCOUNTER — Emergency Department (HOSPITAL_COMMUNITY)
Admission: EM | Admit: 2017-11-13 | Discharge: 2017-11-13 | Disposition: A | Discharge status: Home / Self Care | Payer: Medicare Other | Attending: Emergency Medicine | Admitting: Emergency Medicine

## 2017-11-13 ENCOUNTER — Other Ambulatory Visit: Payer: Self-pay

## 2017-11-13 ENCOUNTER — Emergency Department (HOSPITAL_COMMUNITY): Payer: Medicare Other

## 2017-11-13 DIAGNOSIS — R059 Cough, unspecified: Secondary | ICD-10-CM

## 2017-11-13 DIAGNOSIS — R0789 Other chest pain: Secondary | ICD-10-CM | POA: Insufficient documentation

## 2017-11-13 DIAGNOSIS — R05 Cough: Secondary | ICD-10-CM | POA: Diagnosis present

## 2017-11-13 DIAGNOSIS — J45909 Unspecified asthma, uncomplicated: Secondary | ICD-10-CM | POA: Diagnosis not present

## 2017-11-13 DIAGNOSIS — Z79899 Other long term (current) drug therapy: Secondary | ICD-10-CM | POA: Diagnosis not present

## 2017-11-13 DIAGNOSIS — F17200 Nicotine dependence, unspecified, uncomplicated: Secondary | ICD-10-CM | POA: Insufficient documentation

## 2017-11-13 LAB — CBC
HCT: 40.9 % (ref 36.0–46.0)
HEMOGLOBIN: 13.1 g/dL (ref 12.0–15.0)
MCH: 28.5 pg (ref 26.0–34.0)
MCHC: 32 g/dL (ref 30.0–36.0)
MCV: 89.1 fL (ref 78.0–100.0)
Platelets: 268 10*3/uL (ref 150–400)
RBC: 4.59 MIL/uL (ref 3.87–5.11)
RDW: 14.7 % (ref 11.5–15.5)
WBC: 6.4 10*3/uL (ref 4.0–10.5)

## 2017-11-13 LAB — RAPID HIV SCREEN (HIV 1/2 AB+AG)
HIV 1/2 ANTIBODIES: NONREACTIVE
HIV-1 P24 ANTIGEN - HIV24: NONREACTIVE

## 2017-11-13 LAB — BASIC METABOLIC PANEL
Anion gap: 11 (ref 5–15)
BUN: 5 mg/dL — AB (ref 6–20)
CHLORIDE: 106 mmol/L (ref 101–111)
CO2: 20 mmol/L — AB (ref 22–32)
CREATININE: 0.84 mg/dL (ref 0.44–1.00)
Calcium: 8.6 mg/dL — ABNORMAL LOW (ref 8.9–10.3)
GFR calc non Af Amer: >60 mL/min (ref >60)
Glucose, Bld: 87 mg/dL (ref 65–99)
POTASSIUM: 3.8 mmol/L (ref 3.5–5.1)
Sodium: 137 mmol/L (ref 135–145)

## 2017-11-13 LAB — INFLUENZA PANEL BY PCR (TYPE A & B)
INFLAPCR: NEGATIVE
Influenza B By PCR: NEGATIVE

## 2017-11-13 LAB — BRAIN NATRIURETIC PEPTIDE: B Natriuretic Peptide: 15.7 pg/mL (ref 0.0–100.0)

## 2017-11-13 MED ORDER — PREDNISONE 20 MG PO TABS: 40.0000 mg | ORAL_TABLET | Freq: Every day | ORAL | 0 refills | Status: DC

## 2017-11-13 MED ORDER — DOXYCYCLINE HYCLATE 100 MG PO CAPS: 100.0000 mg | ORAL_CAPSULE | Freq: Two times a day (BID) | ORAL | 0 refills | Status: DC

## 2017-11-13 MED ORDER — DOXYCYCLINE HYCLATE 100 MG PO CAPS: 100.0000 mg | ORAL_CAPSULE | Freq: Two times a day (BID) | ORAL | 0 refills | Status: AC

## 2017-11-13 MED ORDER — FLUCONAZOLE 150 MG PO TABS: 150.0000 mg | ORAL_TABLET | Freq: Every day | ORAL | 0 refills | Status: DC

## 2017-11-13 MED ORDER — FLUCONAZOLE 150 MG PO TABS: 150.0000 mg | ORAL_TABLET | Freq: Every day | ORAL | 0 refills | Status: AC

## 2017-11-13 MED ORDER — IPRATROPIUM-ALBUTEROL 0.5-2.5 (3) MG/3ML IN SOLN
3.0000 mL | Freq: Once | RESPIRATORY_TRACT | Status: AC
  Administered 2017-11-13: 3 mL via RESPIRATORY_TRACT
  Filled 2017-11-13: qty 3

## 2017-11-13 NOTE — ED Triage Notes (Signed)
C/o cough and chest pain into her back onset 3 weeks ago states pain is worse today , now has a productive cough.

## 2017-11-13 NOTE — Discharge Instructions (Signed)
Your lab work and EKG today was reassuring.    Please continue taking albuterol every 4 hours as needed for wheezing. I have written you a prescription for an antibiotic. Please take this twice a day for the next 7days.   I have also written you a prescription for prednisone which is a medicine that can help with your asthma. Please take 40mg  daily for the next 5 days.  Diflucan is a medicine for yeast infections should you get one after taking the antibiotic.   Your heart size does appear to be somewhat enlarged.  Please follow-up with your primary doctor. Your blood pressure was elevated, please have this rechecked.   Return to the ER if you develop worsening chest pain, trouble breathing or have fever >100.60F.

## 2017-11-13 NOTE — ED Notes (Signed)
Pt verbalized understanding discharge instructions and denies any further needs or questions at this time. VS stable, ambulatory and steady gait.   

## 2017-11-13 NOTE — ED Provider Notes (Signed)
[facility] EMERGENCY DEPARTMENT Provider Note   CSN: [id number] Arrival date & time: 11/13/17  1191     History   Chief Complaint Chief Complaint  Patient presents with  . Cough    HPI Stacy Blackwell is a 37 y.o. female.  HPI   Stacy Blackwell is a 37yo female with a history of asthma, allergic rhinitis, obesity who presents to the emergency department for evaluation of persistent cough.  Patient states that she was diagnosed with an upper respiratory tract infection at urgent care 3 weeks ago.  States that her cough has not improved and is persistent with a green/brown sputum.  She also reports substernal chest pain with cough which radiates to the bilateral thoracic region.  She denies chest pain or back pain in the absence of cough.  She also reports associated wheezing.  Reports that she sometimes feels short of breath with a cough attack, does not report shortness of breath currently.  Reports that she has been using her albuterol inhaler every 6 hours at home.  Has also been using Robitussin and Tessalon Perles without significant improvement.  Denies fevers sore throat, congestion, ear pain, orthopnea, PND, abdominal pain, N/V, leg swelling, calf pain, pleuritic chest pain, history of DVT/PE, recent travel or immobility.  Past Medical History:  Diagnosis Date  . Acid reflux   . Asthma   . Depression    doing good, no meds  . Obesity   . Ovarian cyst   . Vertigo     Patient Active Problem List   Diagnosis Date Noted  . NSVD (normal spontaneous vaginal delivery) 01/14/2015  . Premature rupture of membranes 01/13/2015  . Active labor at term 01/13/2015  . Polyhydramnios in third trimester   . [redacted] weeks gestation of pregnancy   . Polyhydramnios in third trimester, antepartum   . [redacted] weeks gestation of pregnancy   . Large for dates   . [redacted] weeks gestation of pregnancy   . Abdominal pain in pregnancy, antepartum   . [redacted] weeks gestation of pregnancy     . Evaluate anatomy not seen on prior sonogram   . Obesity in pregnancy with antepartum complication   . [redacted] weeks gestation of pregnancy   . Encounter for fetal anatomic survey   . [redacted] weeks gestation of pregnancy   . 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 Allergic rhinitis 07/01/2014  . GERD (gastroesophageal reflux disease) 07/01/2014  . Tobacco use disorder 07/01/2014    Past Surgical History:  Procedure Laterality Date  . CYST EXCISION Left 1987    OB History    Gravida Para Term Preterm AB Living   2 2 2  0 0 2   SAB TAB Ectopic Multiple Live Births   0 0 0 0 2       Home Medications    Prior to Admission medications   Medication Sig Start Date End Date Taking? Authorizing Provider  albuterol (PROVENTIL HFA;VENTOLIN HFA) 108 (90 Base) MCG/ACT inhaler Inhale 1-2 puffs into the lungs every 6 (six) hours as needed for wheezing or shortness of breath. 04/06/17   Ward, Chase Picket, PA-C  benzonatate (TESSALON) 100 MG capsule Take 1 capsule (100 mg total) by mouth every 8 (eight) hours. 10/29/17   Wieters, Hallie C, PA-C  cetirizine-pseudoephedrine (ZYRTEC-D) 5-120 MG tablet Take 1 tablet by mouth daily. 12/17/16   Arby Barrette, MD  cyclobenzaprine (FLEXERIL) 10 MG tablet Take 1 tablet (10 mg total) by mouth 2 (two) times daily as needed  for muscle spasms. 04/21/17   Wojeck, Hinton Dyerobyn K, NP  fexofenadine (ALLEGRA) 30 MG tablet Take 30 mg by mouth daily.     [provider]  ibuprofen (ADVIL,MOTRIN) 600 MG tablet Take 1 tablet (600 mg total) by mouth every 6 (six) hours as needed (pain). 04/21/17   Wojeck, Hinton Dyerobyn K, NP  medroxyPROGESTERone (DEPO-PROVERA) 150 MG/ML injection Inject 1 mL (150 mg total) into the muscle every 3 (three) months. 01/15/15   Montez MoritaLawson, Marie D, CNM    Family History Family History  Problem Relation Age of Onset  . Asthma Mother   . Diabetes Mother   . Heart disease Mother        CHF  . Asthma Sister   . Diabetes Sister   . Heart disease Sister   . Hearing  loss Neg Hx     Social History Social History   Tobacco Use  . Smoking status: Current Some Day Smoker    Packs/day: 0.25    Years: 10.00    Pack years: 2.50    Types: Cigarettes  . Smokeless tobacco: Never Used  Substance Use Topics  . Alcohol use: Yes  . Drug use: No     Allergies   Amoxicillin and Hydrocodone   Review of Systems Review of Systems  Constitutional: Negative for chills, fatigue and fever.  Respiratory: Positive for cough, shortness of breath and wheezing.   Cardiovascular: Positive for chest pain. Negative for palpitations and leg swelling.  Gastrointestinal: Negative for abdominal pain, diarrhea, nausea and vomiting.  Musculoskeletal: Positive for back pain (with cough). Negative for gait problem.  Skin: Negative for rash.  Neurological: Negative for dizziness and light-headedness.     Physical Exam Updated Vital Signs BP (!) 146/89 (BP Location: Left Arm)   Pulse 86   Temp 98.1 F (36.7 C)   Resp 16   Ht 5\' 1"  (1.549 m)   Wt 131.1 kg (289 lb)   SpO2 99%   BMI 54.61 kg/m   Physical Exam  Constitutional: She is oriented to person, place, and time. She appears well-developed and well-nourished. No distress.  No acute distress.  HENT:  Head: Normocephalic and atraumatic.  Eyes: Right eye exhibits no discharge. Left eye exhibits no discharge.  Neck: Normal range of motion. Neck supple. No JVD present. No tracheal deviation present.  Cardiovascular: Normal rate, regular rhythm and intact distal pulses. Exam reveals no friction rub.  No murmur heard. Pulmonary/Chest: Effort normal. No respiratory distress.  Wheezing in bilateral lung fields.  Good air movement.  No respiratory distress.  No stridor, rales or rhonchi. No chest tenderness to palpation.   Abdominal: Soft. Bowel sounds are normal. There is no tenderness. There is no guarding.  Musculoskeletal:  No leg swelling or calf tenderness.   Lymphadenopathy:    She has no cervical  adenopathy.  Neurological: She is alert and oriented to person, place, and time. Coordination normal.  Skin: Skin is warm and dry. Capillary refill takes less than 2 seconds. She is not diaphoretic.  Psychiatric: She has a normal mood and affect. Her behavior is normal.  Nursing note and vitals reviewed.    ED Treatments / Results  Labs (all labs ordered are listed, but only abnormal results are displayed) Labs Reviewed  BASIC METABOLIC PANEL - Abnormal; Notable for the following components:      Result Value   CO2 20 (*)    BUN 5 (*)    Calcium 8.6 (*)    All other components  within normal limits  BRAIN NATRIURETIC PEPTIDE  CBC  RAPID HIV SCREEN (HIV 1/2 AB+AG)  INFLUENZA PANEL BY PCR (TYPE A & B)  I-STAT TROPONIN, ED    EKG  EKG Interpretation  Date/Time:  Thursday November 13 2017 09:32:47 EST Ventricular Rate:  87 PR Interval:  136 QRS Duration: 74 QT Interval:  388 QTC Calculation: 466 R Axis:   59 Text Interpretation:  Normal sinus rhythm with sinus arrhythmia Normal ECG No significant change since last tracing Confirmed by Richardean Canal [id number]) on 11/13/2017 11:20:43 AM       Radiology Dg Chest 2 View  Result Date: 11/13/2017 CLINICAL DATA:  Cough and congestion. EXAM: CHEST  2 VIEW COMPARISON:  CT 04/06/2017.  Chest x-ray 04/05/2017. FINDINGS: Mediastinum hilar structures normal. Borderline cardiomegaly again noted. Mild pulmonary venous congestion and interstitial prominence. Again noted. Again a mild component CHF cannot be excluded. No pleural effusion or pneumothorax. IMPRESSION: Borderline cardiomegaly again noted. Mild pulmonary venous congestion. Mild interstitial prominence again noted. Again a mild component of CHF cannot be excluded. Similar findings noted on prior study. Electronically Signed   By: Maisie Fus  Register   On: 11/13/2017 11:02    Procedures Procedures (including critical care time)  Medications Ordered in ED Medications - No data to  display   Initial Impression / Assessment and Plan / ED Course  I have reviewed the triage vital signs and the nursing notes.  Pertinent labs & imaging results that were available during my care of the patient were reviewed by me and considered in my medical decision making (see chart for details).    CXR reveals borderline cardiomegaly and pulmonary venous congestion. This is consistent with her last CXR 03/2017, although appears to be somewhat worsened. Potentially atypical pneumonia overlying.   On exam, no leg swelling or signs of fluid overload. No respiratory distress and VSS. Likely her CP is musculoskeletal given occurs with cough only, but given Xray results will get further cardiac work-up including CBC, BMP, EKG, Troponin, BNP. Do not suspect PE given no pleuritic CP, history of DVT/PE, tachycardia, tachypnea, calf pain/swelling, recent surgery or immobility. Discussed with Dr. Silverio Lay who agrees with this plan.  Results reviewed. Do not suspect ACS given EKG is non-ischemic, negative troponin, CBC and BMP unremarkable and presentation of CP with cough only. BNP WNL, do not suspect fluid overload or CHF exacerbation.   Patient given duoneb with subsequent improvement in lung exam and cough. Have counseled patient on use of albuterol inhaler at home and will send home with steroid burst. Will also cover for pneumonia with doxycycline. Discussed return precautions and patient agrees and voices understanding to the above plan. Discussed this patient with Dr. Silverio Lay who also agrees with this plan.    Final Clinical Impressions(s) / ED Diagnoses   Final diagnoses:  Cough     Kellie Shropshire, PA-C 11/13/17 1959    Charlynne Pander, MD 11/14/17 [phone]

## 2017-11-13 NOTE — ED Notes (Signed)
Patient transported to X-ray 

## 2017-11-18 ENCOUNTER — Other Ambulatory Visit: Payer: Self-pay

## 2017-11-18 ENCOUNTER — Ambulatory Visit (HOSPITAL_COMMUNITY)
Admission: EM | Admit: 2017-11-18 | Discharge: 2017-11-18 | Disposition: A | Discharge status: Home / Self Care | Payer: Medicare Other | Attending: Family Medicine | Admitting: Family Medicine

## 2017-11-18 ENCOUNTER — Encounter (HOSPITAL_COMMUNITY): Payer: Self-pay | Admitting: Emergency Medicine

## 2017-11-18 DIAGNOSIS — R002 Palpitations: Secondary | ICD-10-CM | POA: Diagnosis not present

## 2017-11-18 DIAGNOSIS — F411 Generalized anxiety disorder: Secondary | ICD-10-CM

## 2017-11-18 NOTE — Medical Student Note (Signed)
[facility] Insurance account manager Note For educational purposes for Medical, PA and NP students only and not part of the legal medical record.   CSN: [id number] Arrival date & time: 11/18/17  4098     History   Chief Complaint No chief complaint on file.   HPI Stacy Blackwell is a 37 y.o. female.  HPI 37 year old AA female with history of asthma and anxiety presents today with new onset intermittent palpitations, occurred twice yesterday while at rest that self-resolved after a couple hours. States she is under a tremendous amount of stress and the anniversary of the death of her 2 nephews is coming up and her sister's home is being shot at.   - Denies chest pain/tightness, shortness of breath, leg swelling, nausea/vomiting/diarrhea, visual changes, fever/chills. - Was seen in ED on 1/24 where CXR showed borderline cardiomegaly and mild pulmonary venous congestion and potential pneumonia, started on Doxycycline and Prednisone taper with significant improvement in her cough. - Was seen by PCP for anxiety and tried Buspar without improvement, has plans to schedule an appointment with a therapist and psychiatrist for better management.     Past Medical History:  Diagnosis Date  . Acid reflux   . Asthma   . Depression    doing good, no meds  . Obesity   . Ovarian cyst   . Vertigo     Patient Active Problem List   Diagnosis Date Noted  . NSVD (normal spontaneous vaginal delivery) 01/14/2015  . Premature rupture of membranes 01/13/2015  . Active labor at term 01/13/2015  . Polyhydramnios in third trimester   . [redacted] weeks gestation of pregnancy   . Polyhydramnios in third trimester, antepartum   . [redacted] weeks gestation of pregnancy   . Large for dates   . [redacted] weeks gestation of pregnancy   . Abdominal pain in pregnancy, antepartum   . [redacted] weeks gestation of pregnancy   . Evaluate anatomy not seen on prior sonogram   . Obesity in pregnancy with antepartum  complication   . [redacted] weeks gestation of pregnancy   . Encounter for fetal anatomic survey   . [redacted] weeks gestation of pregnancy   . 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 Allergic rhinitis 07/01/2014  . GERD (gastroesophageal reflux disease) 07/01/2014  . Tobacco use disorder 07/01/2014    Past Surgical History:  Procedure Laterality Date  . CYST EXCISION Left 1987    OB History    Gravida Para Term Preterm AB Living   2 2 2  0 0 2   SAB TAB Ectopic Multiple Live Births   0 0 0 0 2       Home Medications    Prior to Admission medications   Medication Sig Start Date End Date Taking? Authorizing Provider  albuterol (PROVENTIL HFA;VENTOLIN HFA) 108 (90 Base) MCG/ACT inhaler Inhale 1-2 puffs into the lungs every 6 (six) hours as needed for wheezing or shortness of breath. 04/06/17   Ward, Chase Picket, PA-C  albuterol (PROVENTIL) (2.5 MG/3ML) 0.083% nebulizer solution Take 2.5 mg by nebulization every 6 (six) hours as needed for wheezing or shortness of breath.    [provider]  benzonatate (TESSALON) 100 MG capsule Take 1 capsule (100 mg total) by mouth every 8 (eight) hours. Patient not taking: Reported on 11/13/2017 10/29/17   Wieters, Hallie C, PA-C  cetirizine-pseudoephedrine (ZYRTEC-D) 5-120 MG tablet Take 1 tablet by mouth daily. Patient not taking: Reported on 11/13/2017 12/17/16   Arby Barrette, MD  cyclobenzaprine (FLEXERIL) 10 MG tablet Take 1 tablet (10 mg total) by mouth 2 (two) times daily as needed for muscle spasms. Patient not taking: Reported on 11/13/2017 04/21/17   Wojeck, Hinton Dyer, NP  Dextromethorphan-Guaifenesin (ROBITUSSIN SUGAR FREE) 10-100 MG/5ML liquid Take 10 mLs by mouth every 6 (six) hours as needed (cold symptoms).    [provider]  doxycycline (VIBRAMYCIN) 100 MG capsule Take 1 capsule (100 mg total) by mouth 2 (two) times daily for 7 days. 11/13/17 11/20/17  Kellie Shropshire, PA-C  ibuprofen (ADVIL,MOTRIN) 600 MG tablet Take 1 tablet (600 mg  total) by mouth every 6 (six) hours as needed (pain). Patient not taking: Reported on 11/13/2017 04/21/17   Wojeck, Hinton Dyer, NP  medroxyPROGESTERone (DEPO-PROVERA) 150 MG/ML injection Inject 1 mL (150 mg total) into the muscle every 3 (three) months. 01/15/15   Montez Morita, CNM  predniSONE (DELTASONE) 20 MG tablet Take 2 tablets (40 mg total) by mouth daily. 11/13/17   Kellie Shropshire, PA-C    Family History Family History  Problem Relation Age of Onset  . Asthma Mother   . Diabetes Mother   . Heart disease Mother        CHF  . Asthma Sister   . Diabetes Sister   . Heart disease Sister   . Hearing loss Neg Hx     Social History Social History   Tobacco Use  . Smoking status: Current Some Day Smoker    Packs/day: 0.25    Years: 10.00    Pack years: 2.50    Types: Cigarettes  . Smokeless tobacco: Never Used  Substance Use Topics  . Alcohol use: Yes  . Drug use: No     Allergies   Amoxicillin and Hydrocodone   Review of Systems Review of Systems  Constitutional: Negative for chills and fever.  HENT: Negative for ear pain and sore throat.   Eyes: Negative for pain and visual disturbance.  Respiratory: Negative for cough and shortness of breath.   Cardiovascular: Positive for palpitations. Negative for chest pain and leg swelling.  Gastrointestinal: Negative for abdominal pain and vomiting.  Genitourinary: Negative for dysuria and hematuria.  Musculoskeletal: Negative for arthralgias and back pain.  Skin: Negative for color change and rash.  Neurological: Negative for seizures and syncope.  Psychiatric/Behavioral: The patient is nervous/anxious.   All other systems reviewed and are negative.    Physical Exam Updated Vital Signs BP 115/80 (BP Location: Right Arm)   Pulse 92   Temp 98 F (36.7 C) (Oral)   Resp 20   SpO2 97%   Physical Exam  Constitutional: She is oriented to person, place, and time. She appears well-developed and well-nourished. No  distress.  HENT:  Head: Normocephalic and atraumatic.  Eyes: Conjunctivae are normal.  Neck: Neck supple.  Cardiovascular: Normal rate and regular rhythm.  No murmur heard. Pulmonary/Chest: Effort normal. No stridor. No respiratory distress. She has wheezes. She has no rales. She exhibits no tenderness.  Diffuse expiratory wheezes across all lung fields  Abdominal: Soft. There is no tenderness.  Musculoskeletal: She exhibits no edema.  Neurological: She is alert and oriented to person, place, and time.  Skin: Skin is warm and dry.  Psychiatric: She has a normal mood and affect.  Anxious about her social situation and teary-eyed when talking about her life stressors.   Nursing note and vitals reviewed.    ED Treatments / Results  Labs (all labs ordered are listed, but only abnormal results  are displayed) Labs Reviewed - No data to display  EKG  EKG Interpretation None       Radiology No results found.  Procedures Procedures (including critical care time)  Medications Ordered in ED Medications - No data to display   Initial Impression / Assessment and Plan / ED Course  I have reviewed the triage vital signs and the nursing notes.  Pertinent labs & imaging results that were available during my care of the patient were reviewed by me and considered in my medical decision making (see chart for details).   Palpitations, likely secondary to anxiety Patient is very anxious and other than the resolved palpitations, she is asymptomatic without associated chest pain, shortness of breath, leg swelling, weakness, fatigue, or dizziness. EKG in ED on 1/24 showed NSR with sinus arrhythmia.  - Patient plans to follow up with an outpatient therapist and psychiatrist for comprehensive anxiety management and was reassured that her palpitations are likely exacerbated by stress. She should continue her albuterol inhaler and finish the Doxy/Steroid treatment given to her by the ED.  -  Discussed the EKG and CXR results from the ED visit and she felt reassured that her symptoms are likely psychiatric in origin  - Return to clinic if symptoms worsen or she develops fever/chills, chest pain, difficulty breathing, or has a syncopal episode.   Final Clinical Impressions(s) / ED Diagnoses   Final diagnoses:  None    New Prescriptions New Prescriptions   No medications on file

## 2017-11-18 NOTE — ED Provider Notes (Signed)
[facility]   [id number] 11/18/17 Arrival Time: 4098  ASSESSMENT & PLAN:  1. Palpitations   2. Anxiety state    Patient is very anxious and other than the resolved palpitations, she is asymptomatic without associated chest pain, shortness of breath, leg swelling, weakness, fatigue, or dizziness. EKG in ED on 1/24 showed NSR with sinus arrhythmia.  - Patient plans to follow up with an outpatient therapist and psychiatrist for comprehensive anxiety management and was reassured that her palpitations are likely exacerbated by stress. She should continue her albuterol inhaler and finish the Doxy/Steroid treatment given to her by the ED.  - Discussed the EKG and CXR results from the ED visit and she felt reassured that her symptoms are likely psychiatric in origin  - Return to clinic if symptoms worsen or she develops fever/chills, chest pain, difficulty breathing, or has a syncopal episode.   Reviewed expectations re: course of current medical issues. Questions answered. Patient verbalized understanding. After Visit Summary given.   SUBJECTIVE: History from: patient. Stacy Blackwell is a 37 y.o. female with history of asthma and anxiety presents today with new onset intermittent palpitations, occurred twice yesterday while at rest that self-resolved after a couple hours. States she is under a tremendous amount of stress and the anniversary of the death of her 2 nephews is coming up and her sister's home is being shot at.   - Denies chest pain/tightness, shortness of breath, leg swelling, nausea/vomiting/diarrhea, visual changes, fever/chills. - Was seen in ED on 1/24 where CXR showed borderline cardiomegaly and mild pulmonary venous congestion and potential pneumonia, started on Doxycycline and Prednisone taper with significant improvement in her cough. - Was seen by PCP for anxiety and tried Buspar without improvement, has plans to schedule an appointment with a therapist and  psychiatrist for better management.   ROS: As per HPI.   OBJECTIVE:  Vitals:   11/18/17 1005  BP: 115/80  Pulse: 92  Resp: 20  Temp: 98 F (36.7 C)  TempSrc: Oral  SpO2: 97%    General appearance: alert; no distress Eyes: PERRLA; EOMI; conjunctiva normal HENT: normocephalic; atraumatic; TMs normal; nasal mucosa normal; oral mucosa normal Neck: supple  Lungs: unlabored respirations with symmetrical exp wheezes; no resp distress Heart: regular rate and rhythm Abdomen: soft, non-tender; bowel sounds normal; no masses or organomegaly; no guarding or rebound tenderness Back: no CVA tenderness Extremities: no cyanosis or edema; symmetrical with no gross deformities Skin: warm and dry Neurologic: normal gait; normal symmetric reflexes Psychological: alert and cooperative; anxious about her social situation and teary-eyed when talking about her life stressors.   Labs Reviewed: Results for orders placed or performed during the hospital encounter of 11/13/17  Brain natriuretic peptide  Result Value Ref Range   B Natriuretic Peptide 15.7 0.0 - 100.0 pg/mL  CBC  Result Value Ref Range   WBC 6.4 4.0 - 10.5 K/uL   RBC 4.59 3.87 - 5.11 MIL/uL   Hemoglobin 13.1 12.0 - 15.0 g/dL   HCT 11.9 14.7 - 82.9 %   MCV 89.1 78.0 - 100.0 fL   MCH 28.5 26.0 - 34.0 pg   MCHC 32.0 30.0 - 36.0 g/dL   RDW 56.2 13.0 - 86.5 %   Platelets 268 150 - 400 K/uL  Basic metabolic panel  Result Value Ref Range   Sodium 137 135 - 145 mmol/L   Potassium 3.8 3.5 - 5.1 mmol/L   Chloride 106 101 - 111 mmol/L   CO2 20 (L) 22 -  32 mmol/L   Glucose, Bld 87 65 - 99 mg/dL   BUN 5 (L) 6 - 20 mg/dL   Creatinine, Ser 1.610.84 0.44 - 1.00 mg/dL   Calcium 8.6 (L) 8.9 - 10.3 mg/dL   GFR calc non Af Amer >60 >60 mL/min   GFR calc Af Amer >60 >60 mL/min   Anion gap 11 5 - 15  Rapid HIV screen (HIV 1/2 Ab+Ag)  Result Value Ref Range   HIV-1 P24 Antigen - HIV24 NON REACTIVE NON REACTIVE   HIV 1/2 Antibodies NON REACTIVE  NON REACTIVE   Interpretation (HIV Ag Ab)      A non reactive test result means that HIV 1 or HIV 2 antibodies and HIV 1 p24 antigen were not detected in the specimen.  Influenza panel by PCR (type A & B)  Result Value Ref Range   Influenza A By PCR NEGATIVE NEGATIVE   Influenza B By PCR NEGATIVE NEGATIVE    Allergies  Allergen Reactions  . Amoxicillin Hives and Swelling    Has patient had a PCN reaction causing immediate rash, facial/tongue/throat swelling, SOB or lightheadedness with hypotension: Yes Has patient had a PCN reaction causing severe rash involving mucus membranes or skin necrosis: No Has patient had a PCN reaction that required hospitalization: No Has patient had a PCN reaction occurring within the last 10 years: No If all of the above answers are "NO", then may proceed with Cephalosporin use.   Swelling in the eyes  . Hydrocodone Itching    Past Medical History:  Diagnosis Date  . Acid reflux   . Asthma   . Depression    doing good, no meds  . Obesity   . Ovarian cyst   . Vertigo    Social History   Socioeconomic History  . Marital status: Single    Spouse name: Not on file  . Number of children: Not on file  . Years of education: Not on file  . Highest education level: Not on file  Social Needs  . Financial resource strain: Not on file  . Food insecurity - worry: Not on file  . Food insecurity - inability: Not on file  . Transportation needs - medical: Not on file  . Transportation needs - non-medical: Not on file  Occupational History  . Occupation: unemployed  Tobacco Use  . Smoking status: Current Some Day Smoker    Packs/day: 0.25    Years: 10.00    Pack years: 2.50    Types: Cigarettes  . Smokeless tobacco: Never Used  Substance and Sexual Activity  . Alcohol use: Yes  . Drug use: No  . Sexual activity: Yes    Birth control/protection: None  Other Topics Concern  . Not on file  Social History Narrative  . Not on file   Family  History  Problem Relation Age of Onset  . Asthma Mother   . Diabetes Mother   . Heart disease Mother        CHF  . Asthma Sister   . Diabetes Sister   . Heart disease Sister   . Hearing loss Neg Hx    Past Surgical History:  Procedure Laterality Date  . CYST EXCISION Left 1987     Mardella LaymanHagler, Makala Fetterolf, MD 11/18/17 1119

## 2017-11-18 NOTE — ED Triage Notes (Signed)
Sen at ed last week.  Told she has an enlarged heart and fluid around heart per patient.  Patient says she feels fluttering and then starts worrying that something is going to happen to her.   Patient unclear about instructions at ed visit, now says she was put on antibiotics and prednisone.    Patient has intermittent  Flutters" denies pain

## 2018-03-27 ENCOUNTER — Ambulatory Visit (HOSPITAL_COMMUNITY)
Admission: EM | Admit: 2018-03-27 | Discharge: 2018-03-27 | Disposition: A | Discharge status: Home / Self Care | Payer: Medicare Other | Attending: Family Medicine | Admitting: Family Medicine

## 2018-03-27 ENCOUNTER — Encounter (HOSPITAL_COMMUNITY): Payer: Self-pay

## 2018-03-27 ENCOUNTER — Ambulatory Visit (INDEPENDENT_AMBULATORY_CARE_PROVIDER_SITE_OTHER): Payer: Medicare Other

## 2018-03-27 DIAGNOSIS — J189 Pneumonia, unspecified organism: Secondary | ICD-10-CM

## 2018-03-27 DIAGNOSIS — J181 Lobar pneumonia, unspecified organism: Secondary | ICD-10-CM

## 2018-03-27 DIAGNOSIS — J4521 Mild intermittent asthma with (acute) exacerbation: Secondary | ICD-10-CM

## 2018-03-27 DIAGNOSIS — R062 Wheezing: Secondary | ICD-10-CM | POA: Diagnosis not present

## 2018-03-27 DIAGNOSIS — R05 Cough: Secondary | ICD-10-CM | POA: Diagnosis not present

## 2018-03-27 MED ORDER — IPRATROPIUM-ALBUTEROL 0.5-2.5 (3) MG/3ML IN SOLN
3.0000 mL | Freq: Once | RESPIRATORY_TRACT | Status: AC
  Administered 2018-03-27: 3 mL via RESPIRATORY_TRACT

## 2018-03-27 MED ORDER — ALBUTEROL SULFATE HFA 108 (90 BASE) MCG/ACT IN AERS
2.0000 | INHALATION_SPRAY | Freq: Once | RESPIRATORY_TRACT | Status: AC
  Administered 2018-03-27: 2 via RESPIRATORY_TRACT

## 2018-03-27 MED ORDER — AZITHROMYCIN 250 MG PO TABS: 250.0000 mg | ORAL_TABLET | Freq: Every day | ORAL | 0 refills | Status: DC

## 2018-03-27 MED ORDER — IPRATROPIUM-ALBUTEROL 0.5-2.5 (3) MG/3ML IN SOLN
RESPIRATORY_TRACT | Status: AC
Start: 2018-03-27 — End: ?
  Filled 2018-03-27: qty 3

## 2018-03-27 MED ORDER — GUAIFENESIN-CODEINE 100-10 MG/5ML PO SOLN: 5.0000 mL | Freq: Every evening | ORAL | 0 refills | Status: DC | PRN

## 2018-03-27 MED ORDER — BENZONATATE 100 MG PO CAPS: 100.0000 mg | ORAL_CAPSULE | Freq: Three times a day (TID) | ORAL | 0 refills | Status: DC | PRN

## 2018-03-27 MED ORDER — ALBUTEROL SULFATE HFA 108 (90 BASE) MCG/ACT IN AERS
INHALATION_SPRAY | RESPIRATORY_TRACT | Status: AC
  Filled 2018-03-27: qty 6.7

## 2018-03-27 NOTE — ED Provider Notes (Signed)
[facility]   [id number] 03/27/18 Arrival Time: 1005  SUBJECTIVE:  Stacy Blackwell is a 37 y.o. female with hx significant for asthma who presents with abrupt onset of worsening cough, and wheezing for the past 3 days.  Denies positive sick exposure or precipitating event.  Describes cough as constant and productive with green sputum.  Has tried albuterol inhaler, nebulizer, mucinex, and theraflu without relief.  Denies aggravating factors.  Denies previous symptoms in the past.  Complains of chills, fatigue, sinus pain, rhinorrhea, sore throat, SOB, and pain with coughing.    Denies fever, chest pain, nausea, changes in bowel or bladder habits.    ROS: As per HPI.  Past Medical History:  Diagnosis Date  . Acid reflux   . Asthma   . Depression    doing good, no meds  . Obesity   . Ovarian cyst   . Vertigo    Past Surgical History:  Procedure Laterality Date  . CYST EXCISION Left 1987   Allergies  Allergen Reactions  . Amoxicillin Hives and Swelling    Has patient had a PCN reaction causing immediate rash, facial/tongue/throat swelling, SOB or lightheadedness with hypotension: Yes Has patient had a PCN reaction causing severe rash involving mucus membranes or skin necrosis: No Has patient had a PCN reaction that required hospitalization: No Has patient had a PCN reaction occurring within the last 10 years: No If all of the above answers are "NO", then may proceed with Cephalosporin use.   Swelling in the eyes  . Hydrocodone Itching   No current facility-administered medications on file prior to encounter.    Current Outpatient Medications on File Prior to Encounter  Medication Sig Dispense Refill  . albuterol (PROVENTIL HFA;VENTOLIN HFA) 108 (90 Base) MCG/ACT inhaler Inhale 1-2 puffs into the lungs every 6 (six) hours as needed for wheezing or shortness of breath. 1 Inhaler 0  . albuterol (PROVENTIL) (2.5 MG/3ML) 0.083% nebulizer solution Take 2.5 mg by  nebulization every 6 (six) hours as needed for wheezing or shortness of breath.    . Dextromethorphan-Guaifenesin (ROBITUSSIN SUGAR FREE) 10-100 MG/5ML liquid Take 10 mLs by mouth every 6 (six) hours as needed (cold symptoms).    Marland Kitchen ibuprofen (ADVIL,MOTRIN) 600 MG tablet Take 1 tablet (600 mg total) by mouth every 6 (six) hours as needed (pain). 28 tablet 0  . medroxyPROGESTERone (DEPO-PROVERA) 150 MG/ML injection Inject 1 mL (150 mg total) into the muscle every 3 (three) months. 1 mL 0  . predniSONE (DELTASONE) 20 MG tablet Take 2 tablets (40 mg total) by mouth daily. 10 tablet 0  . cetirizine-pseudoephedrine (ZYRTEC-D) 5-120 MG tablet Take 1 tablet by mouth daily. (Patient not taking: Reported on 11/13/2017) 30 tablet 0  . cyclobenzaprine (FLEXERIL) 10 MG tablet Take 1 tablet (10 mg total) by mouth 2 (two) times daily as needed for muscle spasms. (Patient not taking: Reported on 11/13/2017) 10 tablet 0    Social History   Socioeconomic History  . Marital status: Single    Spouse name: Not on file  . Number of children: Not on file  . Years of education: Not on file  . Highest education level: Not on file  Occupational History  . Occupation: unemployed  Social Needs  . Financial resource strain: Not on file  . Food insecurity:    Worry: Not on file    Inability: Not on file  . Transportation needs:    Medical: Not on file    Non-medical: Not on file  Tobacco Use  . Smoking status: Current Some Day Smoker    Packs/day: 0.25    Years: 10.00    Pack years: 2.50    Types: Cigarettes  . Smokeless tobacco: Never Used  Substance and Sexual Activity  . Alcohol use: Yes  . Drug use: No  . Sexual activity: Yes    Birth control/protection: None  Lifestyle  . Physical activity:    Days per week: Not on file    Minutes per session: Not on file  . Stress: Not on file  Relationships  . Social connections:    Talks on phone: Not on file    Gets together: Not on file    Attends religious  service: Not on file    Active member of club or organization: Not on file    Attends meetings of clubs or organizations: Not on file    Relationship status: Not on file  . Intimate partner violence:    Fear of current or ex partner: Not on file    Emotionally abused: Not on file    Physically abused: Not on file    Forced sexual activity: Not on file  Other Topics Concern  . Not on file  Social History Narrative  . Not on file   Family History  Problem Relation Age of Onset  . Asthma Mother   . Diabetes Mother   . Heart disease Mother        CHF  . Asthma Sister   . Diabetes Sister   . Heart disease Sister   . Hearing loss Neg Hx      OBJECTIVE:  Vitals:   03/27/18 1029 03/27/18 1034  BP:  131/76  Pulse: (!) 104   Resp: 18   Temp: 98.7 F (37.1 C)   SpO2: 95%      General appearance: AOx3 in no acute distress; appears fatigued HEENT: Ears: EACs clear, TM pearly gray with visible cone of light.  Eyes: PERRL.  EOM grossly intact.  Sinuses nontender; Nose: mild clear rhinorrhea; Throat: tonsils nonerythematous, uvula midline Neck: supple without LAD Lungs: Diffuse wheezing appreciated throughout bilateral lung fields; tender with anterior posterior chest compression Heart: regular rate and rhythm.  Radial pulses 2+ symmetrical bilaterally Skin: warm and dry Psychological: alert and cooperative; normal mood and affect   DIAGNOSTICS:  CLINICAL DATA: Cough, shortness of breath  EXAM: CHEST - 2 VIEW  COMPARISON: 11/13/2017  FINDINGS: Patchy bilateral airspace opacities are noted, most pronounced in the right upper lobe and both lung bases concerning for multifocal pneumonia. No effusions. Heart is normal size.  IMPRESSION: Findings compatible with multifocal pneumonia.   Electronically Signed By: Charlett NoseKevin Dover M.D. On: 03/27/2018 11:03  X-rays negative for pleural effusion, or obvious infiltrate or consolidation.  Trachea midline.  No cardiomegaly  or obvious fractures.    I have reviewed the x-rays myself and the radiologist interpretation. I am in agreement with the radiologist interpretation.     ASSESSMENT & PLAN:  1. Pneumonia of both lower lobes due to infectious organism (HCC)   2. Mild intermittent asthma with acute exacerbation     Meds ordered this encounter  Medications  . albuterol (PROVENTIL HFA;VENTOLIN HFA) 108 (90 Base) MCG/ACT inhaler 2 puff  . ipratropium-albuterol (DUONEB) 0.5-2.5 (3) MG/3ML nebulizer solution 3 mL  . azithromycin (ZITHROMAX) 250 MG tablet    Sig: Take 1 tablet (250 mg total) by mouth daily. Take first 2 tablets together, then 1 every day until finished.  Dispense:  6 tablet    Refill:  0    Order Specific Question:   Supervising Provider    Answer:   Isa Rankin [phone]  . DISCONTD: benzonatate (TESSALON) 100 MG capsule    Sig: Take 1 capsule (100 mg total) by mouth 3 (three) times daily as needed for cough.    Dispense:  15 capsule    Refill:  0    Order Specific Question:   Supervising Provider    Answer:   Isa Rankin [phone]  . guaiFENesin-codeine 100-10 MG/5ML syrup    Sig: Take 5 mLs by mouth at bedtime as needed for cough.    Dispense:  120 mL    Refill:  0    Order Specific Question:   Supervising Provider    Answer:   Isa Rankin [[id number]]    Chest x-ray showed pneumonia Duoneb treatment given in office Albuterol inhaler given in office to use at home as needed Cough syrup prescribed.  Use as needed for cough Azithromycin prescribed.  Take as directed and to completion Get plenty of rest and drink lots of fluids Follow up with PCP next week for reevaluation of symptoms Return or go to the ER if you have any new or worsening symptoms  Recommend repeat CXR in 6-8 weeks   Reviewed expectations re: course of current medical issues. Questions answered. Outlined signs and symptoms indicating need for more acute intervention. Patient verbalized  understanding. After Visit Summary given.          Rennis Harding, PA-C 03/27/18 1135

## 2018-03-27 NOTE — Discharge Instructions (Addendum)
Chest x-ray showed pneumonia Duoneb treatment given in office Albuterol inhaler given in office to use at home as needed Cough syrup prescribed.  Use as needed for cough Azithromycin prescribed.  Take as directed and to completion Get plenty of rest and drink lots of fluids Follow up with PCP next week for reevaluation of symptoms Return or go to the ER if you have any new or worsening symptoms  Recommend repeat CXR in 6-8 weeks

## 2018-03-27 NOTE — ED Triage Notes (Signed)
Pt presents with complaints of cough, congestion and wheezing since Tuesday with use of inhaler and nebulizer at home.

## 2018-04-25 ENCOUNTER — Emergency Department (HOSPITAL_COMMUNITY)
Admission: EM | Admit: 2018-04-25 | Discharge: 2018-04-25 | Disposition: A | Discharge status: Home / Self Care | Payer: Medicare Other | Attending: Emergency Medicine | Admitting: Emergency Medicine

## 2018-04-25 ENCOUNTER — Encounter (HOSPITAL_COMMUNITY): Payer: Self-pay | Admitting: *Deleted

## 2018-04-25 ENCOUNTER — Other Ambulatory Visit: Payer: Self-pay

## 2018-04-25 DIAGNOSIS — Z79899 Other long term (current) drug therapy: Secondary | ICD-10-CM | POA: Diagnosis not present

## 2018-04-25 DIAGNOSIS — W57XXXA Bitten or stung by nonvenomous insect and other nonvenomous arthropods, initial encounter: Secondary | ICD-10-CM | POA: Insufficient documentation

## 2018-04-25 DIAGNOSIS — Y93G1 Activity, food preparation and clean up: Secondary | ICD-10-CM | POA: Diagnosis not present

## 2018-04-25 DIAGNOSIS — J45909 Unspecified asthma, uncomplicated: Secondary | ICD-10-CM | POA: Diagnosis not present

## 2018-04-25 DIAGNOSIS — S70362A Insect bite (nonvenomous), left thigh, initial encounter: Secondary | ICD-10-CM | POA: Insufficient documentation

## 2018-04-25 DIAGNOSIS — Y929 Unspecified place or not applicable: Secondary | ICD-10-CM | POA: Diagnosis not present

## 2018-04-25 DIAGNOSIS — Y998 Other external cause status: Secondary | ICD-10-CM | POA: Insufficient documentation

## 2018-04-25 DIAGNOSIS — F17210 Nicotine dependence, cigarettes, uncomplicated: Secondary | ICD-10-CM | POA: Insufficient documentation

## 2018-04-25 MED ORDER — OXYCODONE-ACETAMINOPHEN 5-325 MG PO TABS: 1.0000 | ORAL_TABLET | ORAL | 0 refills | Status: DC | PRN

## 2018-04-25 MED ORDER — DOXYCYCLINE HYCLATE 100 MG PO CAPS: 100.0000 mg | ORAL_CAPSULE | Freq: Two times a day (BID) | ORAL | 0 refills | Status: DC

## 2018-04-25 NOTE — ED Provider Notes (Signed)
[facility] EMERGENCY DEPARTMENT Provider Note   CSN: [id number] Arrival date & time: 04/25/18  0114     History   Chief Complaint Chief Complaint  Patient presents with  . Insect Bite    HPI Stacy Blackwell is a 37 y.o. female.  Patient presents with pain to left outer thigh. She states she was grilling earlier today and felt a sharp pain there that has persisted and spread. No redness or swelling. She did not see an insect or bee.   The history is provided by the patient. No language interpreter was used.    Past Medical History:  Diagnosis Date  . Acid reflux   . Asthma   . Depression    doing good, no meds  . Obesity   . Ovarian cyst   . Vertigo     Patient Active Problem List   Diagnosis Date Noted  . NSVD (normal spontaneous vaginal delivery) 01/14/2015  . Premature rupture of membranes 01/13/2015  . Active labor at term 01/13/2015  . Polyhydramnios in third trimester   . [redacted] weeks gestation of pregnancy   . Polyhydramnios in third trimester, antepartum   . [redacted] weeks gestation of pregnancy   . Large for dates   . [redacted] weeks gestation of pregnancy   . Abdominal pain in pregnancy, antepartum   . [redacted] weeks gestation of pregnancy   . Evaluate anatomy not seen on prior sonogram   . Obesity in pregnancy with antepartum complication   . [redacted] weeks gestation of pregnancy   . Encounter for fetal anatomic survey   . [redacted] weeks gestation of pregnancy   . 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 Allergic rhinitis 07/01/2014  . GERD (gastroesophageal reflux disease) 07/01/2014  . Tobacco use disorder 07/01/2014    Past Surgical History:  Procedure Laterality Date  . CYST EXCISION Left 1987     OB History    Gravida  2   Para  2   Term  2   Preterm  0   AB  0   Living  2     SAB  0   TAB  0   Ectopic  0   Multiple  0   Live Births  2            Home Medications    Prior to Admission medications   Medication Sig Start Date End  Date Taking? Authorizing Provider  albuterol (PROVENTIL HFA;VENTOLIN HFA) 108 (90 Base) MCG/ACT inhaler Inhale 1-2 puffs into the lungs every 6 (six) hours as needed for wheezing or shortness of breath. 04/06/17   Ward, Chase Picket, PA-C  albuterol (PROVENTIL) (2.5 MG/3ML) 0.083% nebulizer solution Take 2.5 mg by nebulization every 6 (six) hours as needed for wheezing or shortness of breath.    [provider]  azithromycin (ZITHROMAX) 250 MG tablet Take 1 tablet (250 mg total) by mouth daily. Take first 2 tablets together, then 1 every day until finished. 03/27/18   Wurst, [place], PA-C  cetirizine-pseudoephedrine (ZYRTEC-D) 5-120 MG tablet Take 1 tablet by mouth daily. Patient not taking: Reported on 11/13/2017 12/17/16   Arby Barrette, MD  cyclobenzaprine (FLEXERIL) 10 MG tablet Take 1 tablet (10 mg total) by mouth 2 (two) times daily as needed for muscle spasms. Patient not taking: Reported on 11/13/2017 04/21/17   Wojeck, Hinton Dyer, NP  Dextromethorphan-Guaifenesin (ROBITUSSIN SUGAR FREE) 10-100 MG/5ML liquid Take 10 mLs by mouth every 6 (six) hours as needed (cold symptoms).  [provider]  guaiFENesin-codeine 100-10 MG/5ML syrup Take 5 mLs by mouth at bedtime as needed for cough. 03/27/18   Wurst, [place], PA-C  ibuprofen (ADVIL,MOTRIN) 600 MG tablet Take 1 tablet (600 mg total) by mouth every 6 (six) hours as needed (pain). 04/21/17   Wojeck, Hinton Dyerobyn K, NP  medroxyPROGESTERone (DEPO-PROVERA) 150 MG/ML injection Inject 1 mL (150 mg total) into the muscle every 3 (three) months. 01/15/15   Montez MoritaLawson, Marie D, CNM  predniSONE (DELTASONE) 20 MG tablet Take 2 tablets (40 mg total) by mouth daily. 11/13/17   Kellie ShropshireShrosbree, Emily J, PA-C    Family History Family History  Problem Relation Age of Onset  . Asthma Mother   . Diabetes Mother   . Heart disease Mother        CHF  . Asthma Sister   . Diabetes Sister   . Heart disease Sister   . Hearing loss Neg Hx     Social History Social  History   Tobacco Use  . Smoking status: Current Some Day Smoker    Packs/day: 0.25    Years: 10.00    Pack years: 2.50    Types: Cigarettes  . Smokeless tobacco: Never Used  Substance Use Topics  . Alcohol use: Yes  . Drug use: No     Allergies   Amoxicillin and Hydrocodone   Review of Systems Review of Systems  Respiratory: Negative for shortness of breath.   Gastrointestinal: Negative for nausea.  Musculoskeletal:       See HPI.  Skin: Negative for color change and wound.  Neurological: Negative for weakness and numbness.     Physical Exam Updated Vital Signs BP (!) 124/52 (BP Location: Right Arm)   Pulse 80   Temp 98.7 F (37.1 C) (Oral)   Resp 16   Ht 5\' 2"  (1.575 m)   Wt 128.8 kg (284 lb)   SpO2 100%   BMI 51.94 kg/m   Physical Exam  Constitutional: She is oriented to person, place, and time. She appears well-developed and well-nourished.  Neck: Normal range of motion.  Pulmonary/Chest: Effort normal.  Musculoskeletal:  Left thigh tender over anterolateral, distal thigh. No induration, redness, swelling. Exam may be limited by obesity. FROM of the extremity.  Neurological: She is alert and oriented to person, place, and time.  Skin: Skin is warm and dry.     ED Treatments / Results  Labs (all labs ordered are listed, but only abnormal results are displayed) Labs Reviewed - No data to display  EKG None  Radiology No results found.  Procedures Procedures (including critical care time)  Medications Ordered in ED Medications - No data to display   Initial Impression / Assessment and Plan / ED Course  I have reviewed the triage vital signs and the nursing notes.  Pertinent labs & imaging results that were available during my care of the patient were reviewed by me and considered in my medical decision making (see chart for details).     Patient here for evaluation of sudden onset, persistent pain to left thigh while standing in her yard  earlier today.   Likely insect bite/bee sting, however, early abscess is not ruled out.   Will start on abx, and recommend close follow up with PCP for recheck.   Final Clinical Impressions(s) / ED Diagnoses   Final diagnoses:  None   1. Insect bite  ED Discharge Orders    None       Elpidio AnisUpstill, Yuriy Cui, Cordelia Poche-C 04/25/18  6962    Glynn Octave, MD 04/25/18 [phone]

## 2018-04-25 NOTE — ED Triage Notes (Signed)
The pt is c/o a pain in her lt lateral thigh  She thinks she was bitten by something last pm  She is also c/o breathing heavy   None observed at present

## 2018-06-25 ENCOUNTER — Emergency Department (HOSPITAL_COMMUNITY): Payer: Medicare Other

## 2018-06-25 ENCOUNTER — Encounter (HOSPITAL_COMMUNITY): Payer: Self-pay | Admitting: Emergency Medicine

## 2018-06-25 ENCOUNTER — Emergency Department (HOSPITAL_COMMUNITY)
Admission: EM | Admit: 2018-06-25 | Discharge: 2018-06-26 | Disposition: A | Discharge status: Home / Self Care | Payer: Medicare Other | Attending: Emergency Medicine | Admitting: Emergency Medicine

## 2018-06-25 ENCOUNTER — Other Ambulatory Visit: Payer: Self-pay

## 2018-06-25 DIAGNOSIS — J4521 Mild intermittent asthma with (acute) exacerbation: Secondary | ICD-10-CM | POA: Diagnosis not present

## 2018-06-25 DIAGNOSIS — Z79899 Other long term (current) drug therapy: Secondary | ICD-10-CM | POA: Insufficient documentation

## 2018-06-25 DIAGNOSIS — R0602 Shortness of breath: Secondary | ICD-10-CM | POA: Diagnosis present

## 2018-06-25 DIAGNOSIS — F17210 Nicotine dependence, cigarettes, uncomplicated: Secondary | ICD-10-CM | POA: Diagnosis not present

## 2018-06-25 MED ORDER — IPRATROPIUM BROMIDE 0.02 % IN SOLN
0.5000 mg | Freq: Once | RESPIRATORY_TRACT | Status: AC
  Administered 2018-06-25: 0.5 mg via RESPIRATORY_TRACT
  Filled 2018-06-25: qty 2.5

## 2018-06-25 MED ORDER — ALBUTEROL SULFATE (2.5 MG/3ML) 0.083% IN NEBU
5.0000 mg | INHALATION_SOLUTION | Freq: Once | RESPIRATORY_TRACT | Status: AC
  Administered 2018-06-25: 5 mg via RESPIRATORY_TRACT
  Filled 2018-06-25: qty 6

## 2018-06-25 MED ORDER — LORAZEPAM 1 MG PO TABS
1.0000 mg | ORAL_TABLET | Freq: Once | ORAL | Status: AC
  Administered 2018-06-25: 1 mg via ORAL
  Filled 2018-06-25: qty 1

## 2018-06-25 MED ORDER — ALBUTEROL SULFATE (2.5 MG/3ML) 0.083% IN NEBU
5.0000 mg | INHALATION_SOLUTION | Freq: Once | RESPIRATORY_TRACT | Status: DC
  Filled 2018-06-25: qty 6

## 2018-06-25 NOTE — ED Triage Notes (Signed)
Pt arrives to ED from home with complaints of shortness of breath since yesterday. EMS reports pt used inhaler at home with no relief. Pt had pneumonia in June. Pt states she has spasms in her back and feet. EMS gave pt 125 Solumedrol and a duoneb tx in route. Pt placed in position of comfort with bed locked and lowered, call bell in reach.

## 2018-06-25 NOTE — ED Notes (Signed)
ED Provider at bedside. 

## 2018-06-25 NOTE — ED Provider Notes (Addendum)
[facility] EMERGENCY DEPARTMENT Provider Note   CSN: [id number] Arrival date & time: 06/25/18  2128     History   Chief Complaint Chief Complaint  Patient presents with  . Shortness of Breath    HPI Stacy Blackwell is a 37 y.o. female.  Patient w hx asthma, c/o increased cough and wheezing in the past 2 days. Symptoms moderate, persistent, felt worse today. Cough non productive. No sore throat. No fever or chills. No chest pain or discomfort. No leg pain or swelling. Non smoker. EMS gave iv solumedrol and albuterol neb tx.   The history is provided by the patient and the EMS personnel.  Shortness of Breath  Associated symptoms include cough and wheezing. Pertinent negatives include no fever, no headaches, no sore throat, no neck pain, no chest pain, no vomiting, no abdominal pain and no rash.    Past Medical History:  Diagnosis Date  . Acid reflux   . Asthma   . Depression    doing good, no meds  . Obesity   . Ovarian cyst   . Vertigo     Patient Active Problem List   Diagnosis Date Noted  . NSVD (normal spontaneous vaginal delivery) 01/14/2015  . Premature rupture of membranes 01/13/2015  . Active labor at term 01/13/2015  . Polyhydramnios in third trimester   . [redacted] weeks gestation of pregnancy   . Polyhydramnios in third trimester, antepartum   . [redacted] weeks gestation of pregnancy   . Large for dates   . [redacted] weeks gestation of pregnancy   . Abdominal pain in pregnancy, antepartum   . [redacted] weeks gestation of pregnancy   . Evaluate anatomy not seen on prior sonogram   . Obesity in pregnancy with antepartum complication   . [redacted] weeks gestation of pregnancy   . Encounter for fetal anatomic survey   . [redacted] weeks gestation of pregnancy   . 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 Allergic rhinitis 07/01/2014  . GERD (gastroesophageal reflux disease) 07/01/2014  . Tobacco use disorder 07/01/2014    Past Surgical History:  Procedure Laterality Date  . CYST  EXCISION Left 1987     OB History    Gravida  2   Para  2   Term  2   Preterm  0   AB  0   Living  2     SAB  0   TAB  0   Ectopic  0   Multiple  0   Live Births  2            Home Medications    Prior to Admission medications   Medication Sig Start Date End Date Taking? Authorizing Provider  albuterol (PROVENTIL HFA;VENTOLIN HFA) 108 (90 Base) MCG/ACT inhaler Inhale 1-2 puffs into the lungs every 6 (six) hours as needed for wheezing or shortness of breath. 04/06/17   Ward, Chase Picket, PA-C  albuterol (PROVENTIL) (2.5 MG/3ML) 0.083% nebulizer solution Take 2.5 mg by nebulization every 6 (six) hours as needed for wheezing or shortness of breath.    [provider]  azithromycin (ZITHROMAX) 250 MG tablet Take 1 tablet (250 mg total) by mouth daily. Take first 2 tablets together, then 1 every day until finished. 03/27/18   Wurst, [place], PA-C  cetirizine-pseudoephedrine (ZYRTEC-D) 5-120 MG tablet Take 1 tablet by mouth daily. Patient not taking: Reported on 11/13/2017 12/17/16   Arby Barrette, MD  cyclobenzaprine (FLEXERIL) 10 MG tablet Take 1 tablet (10 mg total) by  mouth 2 (two) times daily as needed for muscle spasms. Patient not taking: Reported on 11/13/2017 04/21/17   Wojeck, Hinton Dyer, NP  Dextromethorphan-Guaifenesin (ROBITUSSIN SUGAR FREE) 10-100 MG/5ML liquid Take 10 mLs by mouth every 6 (six) hours as needed (cold symptoms).    [provider]  doxycycline (VIBRAMYCIN) 100 MG capsule Take 1 capsule (100 mg total) by mouth 2 (two) times daily. 04/25/18   Elpidio Anis, PA-C  guaiFENesin-codeine 100-10 MG/5ML syrup Take 5 mLs by mouth at bedtime as needed for cough. 03/27/18   Wurst, [place], PA-C  ibuprofen (ADVIL,MOTRIN) 600 MG tablet Take 1 tablet (600 mg total) by mouth every 6 (six) hours as needed (pain). 04/21/17   Wojeck, Hinton Dyer, NP  medroxyPROGESTERone (DEPO-PROVERA) 150 MG/ML injection Inject 1 mL (150 mg total) into the muscle every 3  (three) months. 01/15/15   Montez Morita, CNM  oxyCODONE-acetaminophen (PERCOCET/ROXICET) 5-325 MG tablet Take 1 tablet by mouth every 4 (four) hours as needed for severe pain. 04/25/18   Elpidio Anis, PA-C  predniSONE (DELTASONE) 20 MG tablet Take 2 tablets (40 mg total) by mouth daily. 11/13/17   Kellie Shropshire, PA-C    Family History Family History  Problem Relation Age of Onset  . Asthma Mother   . Diabetes Mother   . Heart disease Mother        CHF  . Asthma Sister   . Diabetes Sister   . Heart disease Sister   . Hearing loss Neg Hx     Social History Social History   Tobacco Use  . Smoking status: Current Some Day Smoker    Packs/day: 0.25    Years: 10.00    Pack years: 2.50    Types: Cigarettes  . Smokeless tobacco: Never Used  Substance Use Topics  . Alcohol use: Yes  . Drug use: No     Allergies   Amoxicillin and Hydrocodone   Review of Systems Review of Systems  Constitutional: Negative for fever.  HENT: Negative for sore throat.   Eyes: Negative for redness.  Respiratory: Positive for cough, shortness of breath and wheezing.   Cardiovascular: Negative for chest pain.  Gastrointestinal: Negative for abdominal pain, diarrhea and vomiting.  Genitourinary: Negative for dysuria.  Musculoskeletal: Positive for back pain. Negative for neck pain and neck stiffness.  Skin: Negative for rash.  Neurological: Negative for headaches.  Hematological: Does not bruise/bleed easily.  Psychiatric/Behavioral: Negative for confusion.     Physical Exam Updated Vital Signs Ht 1.575 m (5\' 2" )   Wt 132.9 kg   SpO2 100%   BMI 53.59 kg/m   Physical Exam  Constitutional: She appears well-developed and well-nourished.  HENT:  Mouth/Throat: Oropharynx is clear and moist.  Eyes: Conjunctivae are normal. No scleral icterus.  Neck: Neck supple. No tracheal deviation present.  No stiffness or rigidity  Cardiovascular: Normal rate, regular rhythm, normal heart  sounds and intact distal pulses. Exam reveals no gallop and no friction rub.  No murmur heard. Pulmonary/Chest: Effort normal. No respiratory distress. She has wheezes. She exhibits no tenderness.  Abdominal: Soft. Normal appearance. She exhibits no distension. There is no tenderness.  Obese.   Genitourinary:  Genitourinary Comments: No cva tenderness  Musculoskeletal: She exhibits no edema or tenderness.  TLS spine non tender, aligned.   Neurological: She is alert.  Skin: Skin is warm and dry. No rash noted.  Psychiatric: She has a normal mood and affect.  Nursing note and vitals reviewed.    ED Treatments /  Results  Labs (all labs ordered are listed, but only abnormal results are displayed) Labs Reviewed - No data to display  EKG None  Radiology Dg Chest 2 View  Result Date: 06/25/2018 CLINICAL DATA:  Dyspnea EXAM: CHEST - 2 VIEW COMPARISON:  03/27/2018 FINDINGS: Stable mild cardiomegaly. Nonaneurysmal thoracic aorta. Central pulmonary vascular congestion with mild pulmonary vascular redistribution consistent with pulmonary edema. No effusion or pneumothorax. No acute osseous abnormality. IMPRESSION: Mild cardiomegaly with mild pulmonary vascular congestion and edema. Electronically Signed   By: Tollie Eth M.D.   On: 06/25/2018 23:13    Procedures Procedures (including critical care time)  Medications Ordered in ED Medications  albuterol (PROVENTIL) (2.5 MG/3ML) 0.083% nebulizer solution 5 mg (has no administration in time range)  ipratropium (ATROVENT) nebulizer solution 0.5 mg (has no administration in time range)  LORazepam (ATIVAN) tablet 1 mg (has no administration in time range)     Initial Impression / Assessment and Plan / ED Course  I have reviewed the triage vital signs and the nursing notes.  Pertinent labs & imaging results that were available during my care of the patient were reviewed by me and considered in my medical decision making (see chart for  details).  Iv ns. Albuterol and atrovent neb.  Reviewed nursing notes and prior charts for additional history.   Cxr.   Pt c/o lower back spasm, worse w turning or movement. Spine non tender, aligned, no step off. Ativan 1 mg po. Motrin po.   cxr reviewed - cm, vascular congestion ?edema.   Wheezing improving w nebs. Persistent wheezing noted, but improved air exchange, and pt feels improved from prior.   Additional albuterol and atrovent neb.   Labs/bnp pending.   Patient signed out to Dr Nicanor Alcon - check labs when back, reassess breathing  - if breathing/wheezing continues improved, and bnp/labs normal, feel pt likely will be able to be d/c to home.     Final Clinical Impressions(s) / ED Diagnoses   Final diagnoses:  None    ED Discharge Orders    None         Cathren Laine, MD 06/25/18 2325

## 2018-06-25 NOTE — ED Notes (Signed)
Patient transported to X-ray 

## 2018-06-26 LAB — BASIC METABOLIC PANEL
Anion gap: 13 (ref 5–15)
BUN: 5 mg/dL — ABNORMAL LOW (ref 6–20)
CALCIUM: 8.7 mg/dL — AB (ref 8.9–10.3)
CO2: 17 mmol/L — AB (ref 22–32)
CREATININE: 0.92 mg/dL (ref 0.44–1.00)
Chloride: 106 mmol/L (ref 98–111)
GFR calc non Af Amer: >60 mL/min (ref >60)
Glucose, Bld: 116 mg/dL — ABNORMAL HIGH (ref 70–99)
Potassium: 3.9 mmol/L (ref 3.5–5.1)
Sodium: 136 mmol/L (ref 135–145)

## 2018-06-26 LAB — CBC
HCT: 38.4 % (ref 36.0–46.0)
Hemoglobin: 12.4 g/dL (ref 12.0–15.0)
MCH: 28.3 pg (ref 26.0–34.0)
MCHC: 32.3 g/dL (ref 30.0–36.0)
MCV: 87.7 fL (ref 78.0–100.0)
Platelets: 235 10*3/uL (ref 150–400)
RBC: 4.38 MIL/uL (ref 3.87–5.11)
RDW: 14.4 % (ref 11.5–15.5)
WBC: 6.7 10*3/uL (ref 4.0–10.5)

## 2018-06-26 LAB — BRAIN NATRIURETIC PEPTIDE: B NATRIURETIC PEPTIDE 5: 12.7 pg/mL (ref 0.0–100.0)

## 2018-06-26 MED ORDER — PREDNISONE 20 MG PO TABS: ORAL_TABLET | ORAL | 0 refills | Status: DC

## 2018-06-26 NOTE — ED Notes (Signed)
Patient verbalizes understanding of discharge instructions. Opportunity for questioning and answers were provided. Armband removed by staff, pt discharged from ED in wheelchair.  

## 2018-09-07 ENCOUNTER — Emergency Department (HOSPITAL_COMMUNITY)
Admission: EM | Admit: 2018-09-07 | Discharge: 2018-09-07 | Disposition: A | Discharge status: Home / Self Care | Payer: Medicare Other | Attending: Emergency Medicine | Admitting: Emergency Medicine

## 2018-09-07 ENCOUNTER — Encounter (HOSPITAL_COMMUNITY): Payer: Self-pay | Admitting: Emergency Medicine

## 2018-09-07 ENCOUNTER — Emergency Department (HOSPITAL_COMMUNITY): Payer: Medicare Other

## 2018-09-07 DIAGNOSIS — F17210 Nicotine dependence, cigarettes, uncomplicated: Secondary | ICD-10-CM | POA: Diagnosis not present

## 2018-09-07 DIAGNOSIS — J45909 Unspecified asthma, uncomplicated: Secondary | ICD-10-CM | POA: Insufficient documentation

## 2018-09-07 DIAGNOSIS — R05 Cough: Secondary | ICD-10-CM | POA: Insufficient documentation

## 2018-09-07 DIAGNOSIS — Z79899 Other long term (current) drug therapy: Secondary | ICD-10-CM | POA: Insufficient documentation

## 2018-09-07 DIAGNOSIS — J069 Acute upper respiratory infection, unspecified: Secondary | ICD-10-CM | POA: Diagnosis not present

## 2018-09-07 DIAGNOSIS — B9789 Other viral agents as the cause of diseases classified elsewhere: Secondary | ICD-10-CM

## 2018-09-07 MED ORDER — BENZONATATE 100 MG PO CAPS
100.0000 mg | ORAL_CAPSULE | Freq: Three times a day (TID) | ORAL | 0 refills | Status: DC
Start: 2018-09-07 — End: 2018-11-08

## 2018-09-07 MED ORDER — IPRATROPIUM-ALBUTEROL 0.5-2.5 (3) MG/3ML IN SOLN
3.0000 mL | Freq: Once | RESPIRATORY_TRACT | Status: AC
  Administered 2018-09-07: 3 mL via RESPIRATORY_TRACT
  Filled 2018-09-07: qty 3

## 2018-09-07 NOTE — Discharge Instructions (Addendum)
Please read attached information. If you experience any new or worsening signs or symptoms please return to the emergency room for evaluation. Please follow-up with your primary care provider or specialist as discussed. Please use medication prescribed only as directed and discontinue taking if you have any concerning signs or symptoms.   °

## 2018-09-07 NOTE — ED Notes (Signed)
Patient transported to X-ray 

## 2018-09-07 NOTE — ED Provider Notes (Signed)
[facility] EMERGENCY DEPARTMENT Provider Note   CSN: [id number] Arrival date & time: 09/07/18  0915     History   Chief Complaint Chief Complaint  Patient presents with  . Cough  . Nasal Congestion    HPI Stacy Blackwell is a 37 y.o. female.  HPI   37 year old female presents today with complaints of cough and sore throat.  Patient notes that symptoms started 2 days ago with sneezing and cough.  Patient reports some minor throat sore throat, she denies any fever, denies shortness of breath.  She does note tightness in her chest.  Patient reports she is a smoker and has asthma.  She has been using albuterol at home without significant improvement in her symptoms.  Past Medical History:  Diagnosis Date  . Acid reflux   . Asthma   . Depression    doing good, no meds  . Obesity   . Ovarian cyst   . Vertigo     Patient Active Problem List   Diagnosis Date Noted  . NSVD (normal spontaneous vaginal delivery) 01/14/2015  . Premature rupture of membranes 01/13/2015  . Active labor at term 01/13/2015  . Polyhydramnios in third trimester   . [redacted] weeks gestation of pregnancy   . Polyhydramnios in third trimester, antepartum   . [redacted] weeks gestation of pregnancy   . Large for dates   . [redacted] weeks gestation of pregnancy   . Abdominal pain in pregnancy, antepartum   . [redacted] weeks gestation of pregnancy   . Evaluate anatomy not seen on prior sonogram   . Obesity in pregnancy with antepartum complication   . [redacted] weeks gestation of pregnancy   . Encounter for fetal anatomic survey   . [redacted] weeks gestation of pregnancy   . 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 Allergic rhinitis 07/01/2014  . GERD (gastroesophageal reflux disease) 07/01/2014  . Tobacco use disorder 07/01/2014    Past Surgical History:  Procedure Laterality Date  . CYST EXCISION Left 1987     OB History    Gravida  2   Para  2   Term  2   Preterm  0   AB  0   Living  2     SAB  0   TAB  0   Ectopic  0   Multiple  0   Live Births  2            Home Medications    Prior to Admission medications   Medication Sig Start Date End Date Taking? Authorizing Provider  albuterol (PROVENTIL HFA;VENTOLIN HFA) 108 (90 Base) MCG/ACT inhaler Inhale 1-2 puffs into the lungs every 6 (six) hours as needed for wheezing or shortness of breath. 04/06/17   Ward, Chase Picket, PA-C  albuterol (PROVENTIL) (2.5 MG/3ML) 0.083% nebulizer solution Take 2.5 mg by nebulization every 6 (six) hours as needed for wheezing or shortness of breath.    [provider]  azithromycin (ZITHROMAX) 250 MG tablet Take 1 tablet (250 mg total) by mouth daily. Take first 2 tablets together, then 1 every day until finished. 03/27/18   Wurst, [place], PA-C  benzonatate (TESSALON) 100 MG capsule Take 1 capsule (100 mg total) by mouth every 8 (eight) hours. 09/07/18   Grantley Savage, Tinnie Gens, PA-C  cetirizine-pseudoephedrine (ZYRTEC-D) 5-120 MG tablet Take 1 tablet by mouth daily. Patient not taking: Reported on 11/13/2017 12/17/16   Arby Barrette, MD  cyclobenzaprine (FLEXERIL) 10 MG tablet Take 1 tablet (10 mg total) by  mouth 2 (two) times daily as needed for muscle spasms. Patient not taking: Reported on 11/13/2017 04/21/17   Wojeck, Hinton Dyerobyn K, NP  Dextromethorphan-Guaifenesin (ROBITUSSIN SUGAR FREE) 10-100 MG/5ML liquid Take 10 mLs by mouth every 6 (six) hours as needed (cold symptoms).    [provider]  doxycycline (VIBRAMYCIN) 100 MG capsule Take 1 capsule (100 mg total) by mouth 2 (two) times daily. 04/25/18   Elpidio AnisUpstill, Shari, PA-C  guaiFENesin-codeine 100-10 MG/5ML syrup Take 5 mLs by mouth at bedtime as needed for cough. 03/27/18   Wurst, [place]adaBritt[place], PA-C  ibuprofen (ADVIL,MOTRIN) 600 MG tablet Take 1 tablet (600 mg total) by mouth every 6 (six) hours as needed (pain). 04/21/17   Wojeck, Hinton Dyerobyn K, NP  medroxyPROGESTERone (DEPO-PROVERA) 150 MG/ML injection Inject 1 mL (150 mg total) into the muscle every 3  (three) months. 01/15/15   Montez MoritaLawson, Marie D, CNM  oxyCODONE-acetaminophen (PERCOCET/ROXICET) 5-325 MG tablet Take 1 tablet by mouth every 4 (four) hours as needed for severe pain. 04/25/18   Elpidio AnisUpstill, Shari, PA-C  predniSONE (DELTASONE) 20 MG tablet Take 2 tablets (40 mg total) by mouth daily. 11/13/17   Kellie ShropshireShrosbree, Emily J, PA-C  predniSONE (DELTASONE) 20 MG tablet 3 tabs po day one, then 2 po daily x 4 days 06/26/18   Nicanor AlconPalumbo, April, MD    Family History Family History  Problem Relation Age of Onset  . Asthma Mother   . Diabetes Mother   . Heart disease Mother        CHF  . Asthma Sister   . Diabetes Sister   . Heart disease Sister   . Hearing loss Neg Hx     Social History Social History   Tobacco Use  . Smoking status: Current Some Day Smoker    Packs/day: 0.25    Years: 10.00    Pack years: 2.50    Types: Cigarettes  . Smokeless tobacco: Never Used  Substance Use Topics  . Alcohol use: Yes  . Drug use: No     Allergies   Amoxicillin and Hydrocodone   Review of Systems Review of Systems  All other systems reviewed and are negative.    Physical Exam Updated Vital Signs BP (!) 152/91 (BP Location: Right Arm)   Pulse (!) 101   Temp 98.1 F (36.7 C) (Oral)   Resp 18   SpO2 99%   Physical Exam  Constitutional: She is oriented to person, place, and time. She appears well-developed and well-nourished.  HENT:  Head: Normocephalic and atraumatic.  Eyes: Pupils are equal, round, and reactive to light. Conjunctivae are normal. Right eye exhibits no discharge. Left eye exhibits no discharge. No scleral icterus.  Neck: Normal range of motion. No JVD present. No tracheal deviation present.  Cardiovascular: Regular rhythm and normal heart sounds.  Pulmonary/Chest: Effort normal. No stridor.  Bilateral expiratory wheeze noted, no crackles, no respiratory distress  Neurological: She is alert and oriented to person, place, and time. Coordination normal.  Psychiatric: She has  a normal mood and affect. Her behavior is normal. Judgment and thought content normal.  Nursing note and vitals reviewed.    ED Treatments / Results  Labs (all labs ordered are listed, but only abnormal results are displayed) Labs Reviewed - No data to display  EKG None  Radiology Dg Chest 2 View  Result Date: 09/07/2018 CLINICAL DATA:  Cough and congestion for the past 3 days. No fevers. EXAM: CHEST - 2 VIEW COMPARISON:  06/25/2018 FINDINGS: The heart is normal in size.  The mediastinal and hilar contours appear normal in stable. Peribronchial thickening and increased interstitial markings appear chronic and are likely due to smoking. Superimposed bronchitis is possible. No definite infiltrates effusions. The bony thorax is intact. IMPRESSION: Chronic bronchitic type changes suggest smoking changes. Superimposed bronchitis is possible. No infiltrates or effusions. Electronically Signed   By: Rudie Meyer M.D.   On: 09/07/2018 11:40    Procedures Procedures (including critical care time)  Medications Ordered in ED Medications  ipratropium-albuterol (DUONEB) 0.5-2.5 (3) MG/3ML nebulizer solution 3 mL (3 mLs Nebulization Given 09/07/18 1003)     Initial Impression / Assessment and Plan / ED Course  I have reviewed the triage vital signs and the nursing notes.  Pertinent labs & imaging results that were available during my care of the patient were reviewed by me and considered in my medical decision making (see chart for details).    Labs:   Imaging: Chest 2 view  Consults:  Therapeutics: DuoNeb  Discharge Meds: Tessalon  Assessment/Plan: Patient presentation was consistent with viral URI.  She does have a history of asthma, had wheeze initially which completely resolved with one breathing treatment here.  No respiratory distress, no signs of bacterial infection.  Discharged with symptomatic care strict return precautions.  Verbalized understanding and agreement to today's  plan.   Final Clinical Impressions(s) / ED Diagnoses   Final diagnoses:  Viral URI with cough    ED Discharge Orders         Ordered    benzonatate (TESSALON) 100 MG capsule  Every 8 hours     09/07/18 1148           Eyvonne Mechanic, PA-C 09/07/18 1150    Jacalyn Lefevre, MD 09/07/18 1153

## 2018-09-07 NOTE — ED Triage Notes (Signed)
Pt here with c/o cough and congestion for the past 3 days , no fevers , pt is a smoker

## 2018-09-08 ENCOUNTER — Ambulatory Visit (HOSPITAL_COMMUNITY)
Admission: EM | Admit: 2018-09-08 | Discharge: 2018-09-08 | Disposition: A | Discharge status: Home / Self Care | Payer: Medicare Other | Attending: Family Medicine | Admitting: Family Medicine

## 2018-09-08 ENCOUNTER — Encounter (HOSPITAL_COMMUNITY): Payer: Self-pay | Admitting: Emergency Medicine

## 2018-09-08 DIAGNOSIS — J40 Bronchitis, not specified as acute or chronic: Secondary | ICD-10-CM

## 2018-09-08 MED ORDER — PREDNISONE 50 MG PO TABS: 50.0000 mg | ORAL_TABLET | Freq: Every day | ORAL | 0 refills | Status: DC

## 2018-09-08 MED ORDER — IPRATROPIUM BROMIDE 0.06 % NA SOLN: 2.0000 | Freq: Four times a day (QID) | NASAL | 0 refills | Status: DC

## 2018-09-08 MED ORDER — HYDROCOD POLST-CPM POLST ER 10-8 MG/5ML PO SUER: 5.0000 mL | Freq: Two times a day (BID) | ORAL | 0 refills | Status: DC | PRN

## 2018-09-08 NOTE — ED Triage Notes (Signed)
Pt here with cough and congestion; pt seen at ED yesterday for same and told had bronchitis; pt sts unable to sleep due to cough and using inhalers

## 2018-09-08 NOTE — ED Provider Notes (Signed)
[facility]    CSN: [id number] Arrival date & time: 09/08/18  1125     History   Chief Complaint Chief Complaint  Patient presents with  . Cough    HPI Aram BeechamStephanie R Lippe is a 37 y.o. female.   37 year old female comes in for worsening URI symptoms. She was seen at the ED yesterday and was told she had bronchitis. She has had 3-4 day history of URI symptoms with cough, congestion, rhinorrhea. States had wheezing that is improved with albuterol inhaler. CXR negative for pneumonia yesterday. Denies fever, chills, night sweats. States she has had more productive cough, and was unable to sleep at night time due to cough. States she tried tessalon, but kept had vomited the capsule back up. She has been able to eat and drink since then and does not have nausea or further vomiting episodes. Former smoker.      Past Medical History:  Diagnosis Date  . Acid reflux   . Asthma   . Depression    doing good, no meds  . Obesity   . Ovarian cyst   . Vertigo     Patient Active Problem List   Diagnosis Date Noted  . NSVD (normal spontaneous vaginal delivery) 01/14/2015  . Premature rupture of membranes 01/13/2015  . Active labor at term 01/13/2015  . Polyhydramnios in third trimester   . [redacted] weeks gestation of pregnancy   . Polyhydramnios in third trimester, antepartum   . [redacted] weeks gestation of pregnancy   . Large for dates   . [redacted] weeks gestation of pregnancy   . Abdominal pain in pregnancy, antepartum   . [redacted] weeks gestation of pregnancy   . Evaluate anatomy not seen on prior sonogram   . Obesity in pregnancy with antepartum complication   . [redacted] weeks gestation of pregnancy   . Encounter for fetal anatomic survey   . [redacted] weeks gestation of pregnancy   . 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 Allergic rhinitis 07/01/2014  . GERD (gastroesophageal reflux disease) 07/01/2014  . Tobacco use disorder 07/01/2014    Past Surgical History:  Procedure Laterality Date  . CYST  EXCISION Left 1987    OB History    Gravida  2   Para  2   Term  2   Preterm  0   AB  0   Living  2     SAB  0   TAB  0   Ectopic  0   Multiple  0   Live Births  2            Home Medications    Prior to Admission medications   Medication Sig Start Date End Date Taking? Authorizing Provider  albuterol (PROVENTIL HFA;VENTOLIN HFA) 108 (90 Base) MCG/ACT inhaler Inhale 1-2 puffs into the lungs every 6 (six) hours as needed for wheezing or shortness of breath. 04/06/17   Ward, Chase PicketJaime Pilcher, PA-C  albuterol (PROVENTIL) (2.5 MG/3ML) 0.083% nebulizer solution Take 2.5 mg by nebulization every 6 (six) hours as needed for wheezing or shortness of breath.    [provider]  benzonatate (TESSALON) 100 MG capsule Take 1 capsule (100 mg total) by mouth every 8 (eight) hours. 09/07/18   Hedges, Tinnie GensJeffrey, PA-C  chlorpheniramine-HYDROcodone (TUSSIONEX PENNKINETIC ER) 10-8 MG/5ML SUER Take 5 mLs by mouth every 12 (twelve) hours as needed for cough. 09/08/18   Cathie HoopsYu, Oluwatosin Bracy V, PA-C  cyclobenzaprine (FLEXERIL) 10 MG tablet Take 1 tablet (10 mg total) by mouth 2 (  two) times daily as needed for muscle spasms. Patient not taking: Reported on 11/13/2017 04/21/17   Wojeck, Hinton Dyer, NP  Dextromethorphan-Guaifenesin (ROBITUSSIN SUGAR FREE) 10-100 MG/5ML liquid Take 10 mLs by mouth every 6 (six) hours as needed (cold symptoms).    [provider]  ibuprofen (ADVIL,MOTRIN) 600 MG tablet Take 1 tablet (600 mg total) by mouth every 6 (six) hours as needed (pain). 04/21/17   Wojeck, Hinton Dyer, NP  ipratropium (ATROVENT) 0.06 % nasal spray Place 2 sprays into both nostrils 4 (four) times daily. 09/08/18   Cathie Hoops, Pollyanna Levay V, PA-C  medroxyPROGESTERone (DEPO-PROVERA) 150 MG/ML injection Inject 1 mL (150 mg total) into the muscle every 3 (three) months. 01/15/15   Montez Morita, CNM  predniSONE (DELTASONE) 50 MG tablet Take 1 tablet (50 mg total) by mouth daily. 09/08/18   Belinda Fisher, PA-C    Family  History Family History  Problem Relation Age of Onset  . Asthma Mother   . Diabetes Mother   . Heart disease Mother        CHF  . Asthma Sister   . Diabetes Sister   . Heart disease Sister   . Hearing loss Neg Hx     Social History Social History   Tobacco Use  . Smoking status: Current Some Day Smoker    Packs/day: 0.25    Years: 10.00    Pack years: 2.50    Types: Cigarettes  . Smokeless tobacco: Never Used  Substance Use Topics  . Alcohol use: Yes  . Drug use: No     Allergies   Amoxicillin and Hydrocodone   Review of Systems Review of Systems  Reason unable to perform ROS: See HPI as above.     Physical Exam Triage Vital Signs ED Triage Vitals [09/08/18 1221]  Enc Vitals Group     BP (!) 161/82     Pulse Rate 91     Resp 18     Temp 97.8 F (36.6 C)     Temp Source Oral     SpO2 100 %     Weight      Height      Head Circumference      Peak Flow      Pain Score 8     Pain Loc      Pain Edu?      Excl. in GC?    No data found.  Updated Vital Signs BP (!) 161/82 (BP Location: Left Arm)   Pulse 91   Temp 97.8 F (36.6 C) (Oral)   Resp 18   SpO2 100%   Physical Exam  Constitutional: She is oriented to person, place, and time. She appears well-developed and well-nourished. No distress.  HENT:  Head: Normocephalic and atraumatic.  Right Ear: Tympanic membrane, external ear and ear canal normal. Tympanic membrane is not erythematous and not bulging.  Left Ear: Tympanic membrane, external ear and ear canal normal. Tympanic membrane is not erythematous and not bulging.  Nose: Nose normal. Right sinus exhibits no maxillary sinus tenderness and no frontal sinus tenderness. Left sinus exhibits no maxillary sinus tenderness and no frontal sinus tenderness.  Mouth/Throat: Uvula is midline, oropharynx is clear and moist and mucous membranes are normal.  Eyes: Pupils are equal, round, and reactive to light. Conjunctivae are normal.  Neck: Normal range  of motion. Neck supple.  Cardiovascular: Normal rate, regular rhythm and normal heart sounds. Exam reveals no gallop and no friction rub.  No murmur  heard. Pulmonary/Chest: Effort normal and breath sounds normal. No accessory muscle usage or stridor. No respiratory distress. She has no decreased breath sounds. She has no wheezes. She has no rhonchi. She has no rales.  Coughing throughout exam.   Lymphadenopathy:    She has no cervical adenopathy.  Neurological: She is alert and oriented to person, place, and time.  Skin: Skin is warm and dry.  Psychiatric: She has a normal mood and affect. Her behavior is normal. Judgment normal.    UC Treatments / Results  Labs (all labs ordered are listed, but only abnormal results are displayed) Labs Reviewed - No data to display  EKG None  Radiology Dg Chest 2 View  Result Date: 09/07/2018 CLINICAL DATA:  Cough and congestion for the past 3 days. No fevers. EXAM: CHEST - 2 VIEW COMPARISON:  06/25/2018 FINDINGS: The heart is normal in size. The mediastinal and hilar contours appear normal in stable. Peribronchial thickening and increased interstitial markings appear chronic and are likely due to smoking. Superimposed bronchitis is possible. No definite infiltrates effusions. The bony thorax is intact. IMPRESSION: Chronic bronchitic type changes suggest smoking changes. Superimposed bronchitis is possible. No infiltrates or effusions. Electronically Signed   By: Rudie Meyer M.D.   On: 09/07/2018 11:40    Procedures Procedures (including critical care time)  Medications Ordered in UC Medications - No data to display  Initial Impression / Assessment and Plan / UC Course  I have reviewed the triage vital signs and the nursing notes.  Pertinent labs & imaging results that were available during my care of the patient were reviewed by me and considered in my medical decision making (see chart for details).     Exam reassuring. Will provide  prednisone and tussionex for symptoms. Other symptomatic treatment discussed. Push fluids. Return precautions given. Patient expresses understanding and agrees to plan.  Final Clinical Impressions(s) / UC Diagnoses   Final diagnoses:  Bronchitis    ED Prescriptions    Medication Sig Dispense Auth. Provider   predniSONE (DELTASONE) 50 MG tablet Take 1 tablet (50 mg total) by mouth daily. 5 tablet Kristiane Morsch V, PA-C   chlorpheniramine-HYDROcodone (TUSSIONEX PENNKINETIC ER) 10-8 MG/5ML SUER Take 5 mLs by mouth every 12 (twelve) hours as needed for cough. 115 mL [place] Foerster V, PA-C   ipratropium (ATROVENT) 0.06 % nasal spray Place 2 sprays into both nostrils 4 (four) times daily. 15 mL Linward Headland V, PA-C     Controlled Substance Prescriptions [place] Controlled Substance Registry consulted? Yes, I have consulted the [place] Controlled Substances Registry for this patient, and feel the risk/benefit ratio today is favorable for proceeding with this prescription for a controlled substance.   Belinda Fisher, PA-C 09/08/18 1341

## 2018-09-08 NOTE — Discharge Instructions (Signed)
Continue albuterol as needed. Prednisone as directed. Tussionex as needed for cough at night. Start flonase, atrovent nasal spray for nasal congestion/drainage. You can use over the counter nasal saline rinse such as neti pot for nasal congestion. Keep hydrated, your urine should be clear to pale yellow in color. Tylenol/motrin for fever and pain. Monitor for any worsening of symptoms, chest pain, shortness of breath, wheezing, swelling of the throat, follow up for reevaluation.   For sore throat/cough try using a honey-based tea. Use 3 teaspoons of honey with juice squeezed from half lemon. Place shaved pieces of ginger into 1/2-1 cup of water and warm over stove top. Then mix the ingredients and repeat every 4 hours as needed.

## 2018-11-08 ENCOUNTER — Encounter (HOSPITAL_COMMUNITY): Payer: Self-pay

## 2018-11-08 ENCOUNTER — Emergency Department (HOSPITAL_COMMUNITY)
Admission: EM | Admit: 2018-11-08 | Discharge: 2018-11-08 | Disposition: A | Discharge status: Home / Self Care | Payer: Medicare Other | Attending: Emergency Medicine | Admitting: Emergency Medicine

## 2018-11-08 DIAGNOSIS — J45909 Unspecified asthma, uncomplicated: Secondary | ICD-10-CM | POA: Diagnosis not present

## 2018-11-08 DIAGNOSIS — J111 Influenza due to unidentified influenza virus with other respiratory manifestations: Secondary | ICD-10-CM | POA: Insufficient documentation

## 2018-11-08 DIAGNOSIS — F17210 Nicotine dependence, cigarettes, uncomplicated: Secondary | ICD-10-CM | POA: Diagnosis not present

## 2018-11-08 DIAGNOSIS — R69 Illness, unspecified: Secondary | ICD-10-CM

## 2018-11-08 DIAGNOSIS — Z79899 Other long term (current) drug therapy: Secondary | ICD-10-CM | POA: Insufficient documentation

## 2018-11-08 DIAGNOSIS — R509 Fever, unspecified: Secondary | ICD-10-CM | POA: Diagnosis present

## 2018-11-08 MED ORDER — ACETAMINOPHEN 325 MG PO TABS: 650.0000 mg | ORAL_TABLET | Freq: Four times a day (QID) | ORAL | 0 refills | Status: AC | PRN

## 2018-11-08 MED ORDER — FLUTICASONE PROPIONATE 50 MCG/ACT NA SUSP: 1.0000 | Freq: Every day | NASAL | 2 refills | Status: AC

## 2018-11-08 MED ORDER — BENZONATATE 100 MG PO CAPS
200.0000 mg | ORAL_CAPSULE | Freq: Three times a day (TID) | ORAL | 0 refills | Status: DC
Start: 2018-11-08 — End: 2023-12-25

## 2018-11-08 MED ORDER — OSELTAMIVIR PHOSPHATE 75 MG PO CAPS: 75.0000 mg | ORAL_CAPSULE | Freq: Two times a day (BID) | ORAL | 0 refills | Status: DC

## 2018-11-08 MED ORDER — CETIRIZINE HCL 5 MG PO TABS: 5.0000 mg | ORAL_TABLET | Freq: Every day | ORAL | 0 refills | Status: AC

## 2018-11-08 MED ORDER — ONDANSETRON 4 MG PO TBDP: 4.0000 mg | ORAL_TABLET | Freq: Three times a day (TID) | ORAL | 0 refills | Status: DC | PRN

## 2018-11-08 NOTE — ED Provider Notes (Signed)
[facility] [place] EMERGENCY DEPARTMENT Provider Note   CSN: [id number] Arrival date & time: 11/08/18  1448     History   Chief Complaint Chief Complaint  Patient presents with  . Diarrhea  . Fever    HPI Stacy Blackwell is a 38 y.o. female with a past medical history of asthma who presents to ED for 2-day history of fever with T-max 102, generalized body aches, nausea, diarrhea, sinus pain and pressure, sore throat, bilateral ear pain and cough.  Reports sick contacts including her children and sister with similar symptoms.  States that her sister was diagnosed with the flu.  She has been taking NyQuil x1 yesterday with no improvement in her symptoms.  She did not receive her influenza vaccine this year.  She denies any recent travel, shortness of breath, abdominal pain, chest pain.  HPI  Past Medical History:  Diagnosis Date  . Acid reflux   . Asthma   . Depression    doing good, no meds  . Obesity   . Ovarian cyst   . Vertigo     Patient Active Problem List   Diagnosis Date Noted  . NSVD (normal spontaneous vaginal delivery) 01/14/2015  . Premature rupture of membranes 01/13/2015  . Active labor at term 01/13/2015  . Polyhydramnios in third trimester   . [redacted] weeks gestation of pregnancy   . Polyhydramnios in third trimester, antepartum   . [redacted] weeks gestation of pregnancy   . Large for dates   . [redacted] weeks gestation of pregnancy   . Abdominal pain in pregnancy, antepartum   . [redacted] weeks gestation of pregnancy   . Evaluate anatomy not seen on prior sonogram   . Obesity in pregnancy with antepartum complication   . [redacted] weeks gestation of pregnancy   . Encounter for fetal anatomic survey   . [redacted] weeks gestation of pregnancy   . 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 Allergic rhinitis 07/01/2014  . GERD (gastroesophageal reflux disease) 07/01/2014  . Tobacco use disorder 07/01/2014    Past Surgical History:  Procedure Laterality Date  . CYST EXCISION Left  1987     OB History    Gravida  2   Para  2   Term  2   Preterm  0   AB  0   Living  2     SAB  0   TAB  0   Ectopic  0   Multiple  0   Live Births  2            Home Medications    Prior to Admission medications   Medication Sig Start Date End Date Taking? Authorizing Provider  acetaminophen (TYLENOL) 325 MG tablet Take 2 tablets (650 mg total) by mouth every 6 (six) hours as needed. 11/08/18   Chene Kasinger, PA-C  albuterol (PROVENTIL HFA;VENTOLIN HFA) 108 (90 Base) MCG/ACT inhaler Inhale 1-2 puffs into the lungs every 6 (six) hours as needed for wheezing or shortness of breath. 04/06/17   Ward, Chase Picket, PA-C  albuterol (PROVENTIL) (2.5 MG/3ML) 0.083% nebulizer solution Take 2.5 mg by nebulization every 6 (six) hours as needed for wheezing or shortness of breath.    [provider]  benzonatate (TESSALON) 100 MG capsule Take 2 capsules (200 mg total) by mouth every 8 (eight) hours. 11/08/18   Kevyn Boquet, PA-C  cetirizine (ZYRTEC) 5 MG tablet Take 1 tablet (5 mg total) by mouth daily. 11/08/18   Queenie Aufiero, Hillary Bow, PA-C  chlorpheniramine-HYDROcodone (  TUSSIONEX PENNKINETIC ER) 10-8 MG/5ML SUER Take 5 mLs by mouth every 12 (twelve) hours as needed for cough. 09/08/18   Cathie Hoops, Amy V, PA-C  cyclobenzaprine (FLEXERIL) 10 MG tablet Take 1 tablet (10 mg total) by mouth 2 (two) times daily as needed for muscle spasms. Patient not taking: Reported on 11/13/2017 04/21/17   Wojeck, Hinton Dyer, NP  Dextromethorphan-Guaifenesin (ROBITUSSIN SUGAR FREE) 10-100 MG/5ML liquid Take 10 mLs by mouth every 6 (six) hours as needed (cold symptoms).    [provider]  fluticasone (FLONASE) 50 MCG/ACT nasal spray Place 1 spray into both nostrils daily. 11/08/18   Gizel Riedlinger, PA-C  ibuprofen (ADVIL,MOTRIN) 600 MG tablet Take 1 tablet (600 mg total) by mouth every 6 (six) hours as needed (pain). 04/21/17   Wojeck, Hinton Dyer, NP  ipratropium (ATROVENT) 0.06 % nasal spray Place 2 sprays into  both nostrils 4 (four) times daily. 09/08/18   Cathie Hoops, Amy V, PA-C  medroxyPROGESTERone (DEPO-PROVERA) 150 MG/ML injection Inject 1 mL (150 mg total) into the muscle every 3 (three) months. 01/15/15   Montez Morita, CNM  ondansetron (ZOFRAN ODT) 4 MG disintegrating tablet Take 1 tablet (4 mg total) by mouth every 8 (eight) hours as needed for nausea or vomiting. 11/08/18   Gervis Gaba, PA-C  oseltamivir (TAMIFLU) 75 MG capsule Take 1 capsule (75 mg total) by mouth every 12 (twelve) hours. 11/08/18   Deliliah Spranger, PA-C  predniSONE (DELTASONE) 50 MG tablet Take 1 tablet (50 mg total) by mouth daily. 09/08/18   Belinda Fisher, PA-C    Family History Family History  Problem Relation Age of Onset  . Asthma Mother   . Diabetes Mother   . Heart disease Mother        CHF  . Asthma Sister   . Diabetes Sister   . Heart disease Sister   . Hearing loss Neg Hx     Social History Social History   Tobacco Use  . Smoking status: Current Some Day Smoker    Packs/day: 0.25    Years: 10.00    Pack years: 2.50    Types: Cigarettes  . Smokeless tobacco: Never Used  Substance Use Topics  . Alcohol use: Yes  . Drug use: No     Allergies   Amoxicillin and Hydrocodone   Review of Systems Review of Systems  Constitutional: Positive for chills and fever.  HENT: Positive for congestion, ear pain, sinus pressure, sinus pain and sore throat.   Respiratory: Positive for cough.   Cardiovascular: Negative for chest pain.  Gastrointestinal: Positive for diarrhea and nausea. Negative for vomiting.     Physical Exam Updated Vital Signs BP (!) 152/94 (BP Location: Right Arm)   Pulse 98   Temp 97.6 F (36.4 C) (Oral)   Resp 16   SpO2 100%   Physical Exam Vitals signs and nursing note reviewed.  Constitutional:      General: She is not in acute distress.    Appearance: She is well-developed.  HENT:     Head: Normocephalic and atraumatic.     Right Ear: A middle ear effusion is present.     Left  Ear: A middle ear effusion is present.     Nose:     Right Sinus: Maxillary sinus tenderness present.     Left Sinus: Maxillary sinus tenderness present.     Mouth/Throat:     Pharynx: Oropharynx is clear. Uvula midline. Posterior oropharyngeal erythema present.     Tonsils: Swelling: 0  on the right. 0 on the left.     Comments: Patient does not appear to be in acute distress. No trismus or drooling present. No pooling of secretions. Patient is tolerating secretions and is not in respiratory distress. No neck pain or tenderness to palpation of the neck. Full active and passive range of motion of the neck. No evidence of RPA or PTA. Eyes:     General: No scleral icterus.       Left eye: No discharge.     Conjunctiva/sclera: Conjunctivae normal.  Neck:     Musculoskeletal: Normal range of motion and neck supple.  Cardiovascular:     Rate and Rhythm: Normal rate and regular rhythm.     Heart sounds: Normal heart sounds. No murmur. No friction rub. No gallop.   Pulmonary:     Effort: Pulmonary effort is normal. No respiratory distress.     Breath sounds: Normal breath sounds.  Abdominal:     General: Bowel sounds are normal. There is no distension.     Palpations: Abdomen is soft.     Tenderness: There is no abdominal tenderness. There is no guarding.     Comments: No TTP.  Musculoskeletal: Normal range of motion.  Skin:    General: Skin is warm and dry.     Findings: No rash.  Neurological:     Mental Status: She is alert.     Motor: No abnormal muscle tone.     Coordination: Coordination normal.      ED Treatments / Results  Labs (all labs ordered are listed, but only abnormal results are displayed) Labs Reviewed - No data to display  EKG None  Radiology No results found.  Procedures Procedures (including critical care time)  Medications Ordered in ED Medications - No data to display   Initial Impression / Assessment and Plan / ED Course  I have reviewed the  triage vital signs and the nursing notes.  Pertinent labs & imaging results that were available during my care of the patient were reviewed by me and considered in my medical decision making (see chart for details).     Patient with symptoms consistent with influenza.  Vitals are stable, low-grade fever.  No signs of dehydration, tolerating PO's.  Lungs are clear. Due to patient's presentation and physical exam a chest x-ray was not ordered bc likely diagnosis of flu.  Discussed the cost versus benefit of Tamiflu treatment with the patient.  Patient elects to have Tamiflu as she is still in the 48-hour window for treatment.  Patient will be discharged with instructions to orally hydrate, rest, and use over-the-counter medications such as anti-inflammatories ibuprofen and Aleve for muscle aches and Tylenol for fever.  Patient will also be given a cough suppressant and other symptomatic treatment.  Patient is hemodynamically stable, in NAD, and able to ambulate in the ED. Evaluation does not show pathology that would require ongoing emergent intervention or inpatient treatment. I explained the diagnosis to the patient. Pain has been managed and has no complaints prior to discharge. Patient is comfortable with above plan and is stable for discharge at this time. All questions were answered prior to disposition. Strict return precautions for returning to the ED were discussed. Encouraged follow up with PCP.    Portions of this note were generated with Scientist, clinical (histocompatibility and immunogenetics)Dragon dictation software. Dictation errors may occur despite best attempts at proofreading.    Final Clinical Impressions(s) / ED Diagnoses   Final diagnoses:  Influenza-like illness  ED Discharge Orders         Ordered    oseltamivir (TAMIFLU) 75 MG capsule  Every 12 hours     11/08/18 1514    benzonatate (TESSALON) 100 MG capsule  Every 8 hours     11/08/18 1514    fluticasone (FLONASE) 50 MCG/ACT nasal spray  Daily     11/08/18 1514     cetirizine (ZYRTEC) 5 MG tablet  Daily     11/08/18 1514    acetaminophen (TYLENOL) 325 MG tablet  Every 6 hours PRN     11/08/18 1514    ondansetron (ZOFRAN ODT) 4 MG disintegrating tablet  Every 8 hours PRN     11/08/18 1514           Dietrich PatesKhatri, Dereona Kolodny, PA-C 11/08/18 1514    Derwood KaplanNanavati, Ankit, MD 11/08/18 1726

## 2018-11-08 NOTE — Discharge Instructions (Signed)
Take the following medications as needed to help with your symptoms. You may still feel sick for the next several days. Return to ED for worsening symptoms, chest pain, shortness of breath, vomiting or coughing up blood.

## 2018-11-08 NOTE — ED Triage Notes (Signed)
Pt presents for evaluation of fevers, diarrhea, vomiting x 3 days. Children are also sick. Pt reports headache and scratchy throat as well.

## 2018-11-08 NOTE — ED Notes (Signed)
Patient verbalizes understanding of discharge instructions. Opportunity for questioning and answers were provided. Armband removed by staff, pt discharged from ED. Pt ambulatory to lobby.  

## 2019-01-23 IMAGING — CR DG CHEST 2V
2 series · 2 of 2 positions shown · non-contrast
Comparison: 06/25/2018

CLINICAL DATA: Cough and congestion for the past 3 days. No fevers.

EXAM:
CHEST - 2 VIEW

[chest pa]
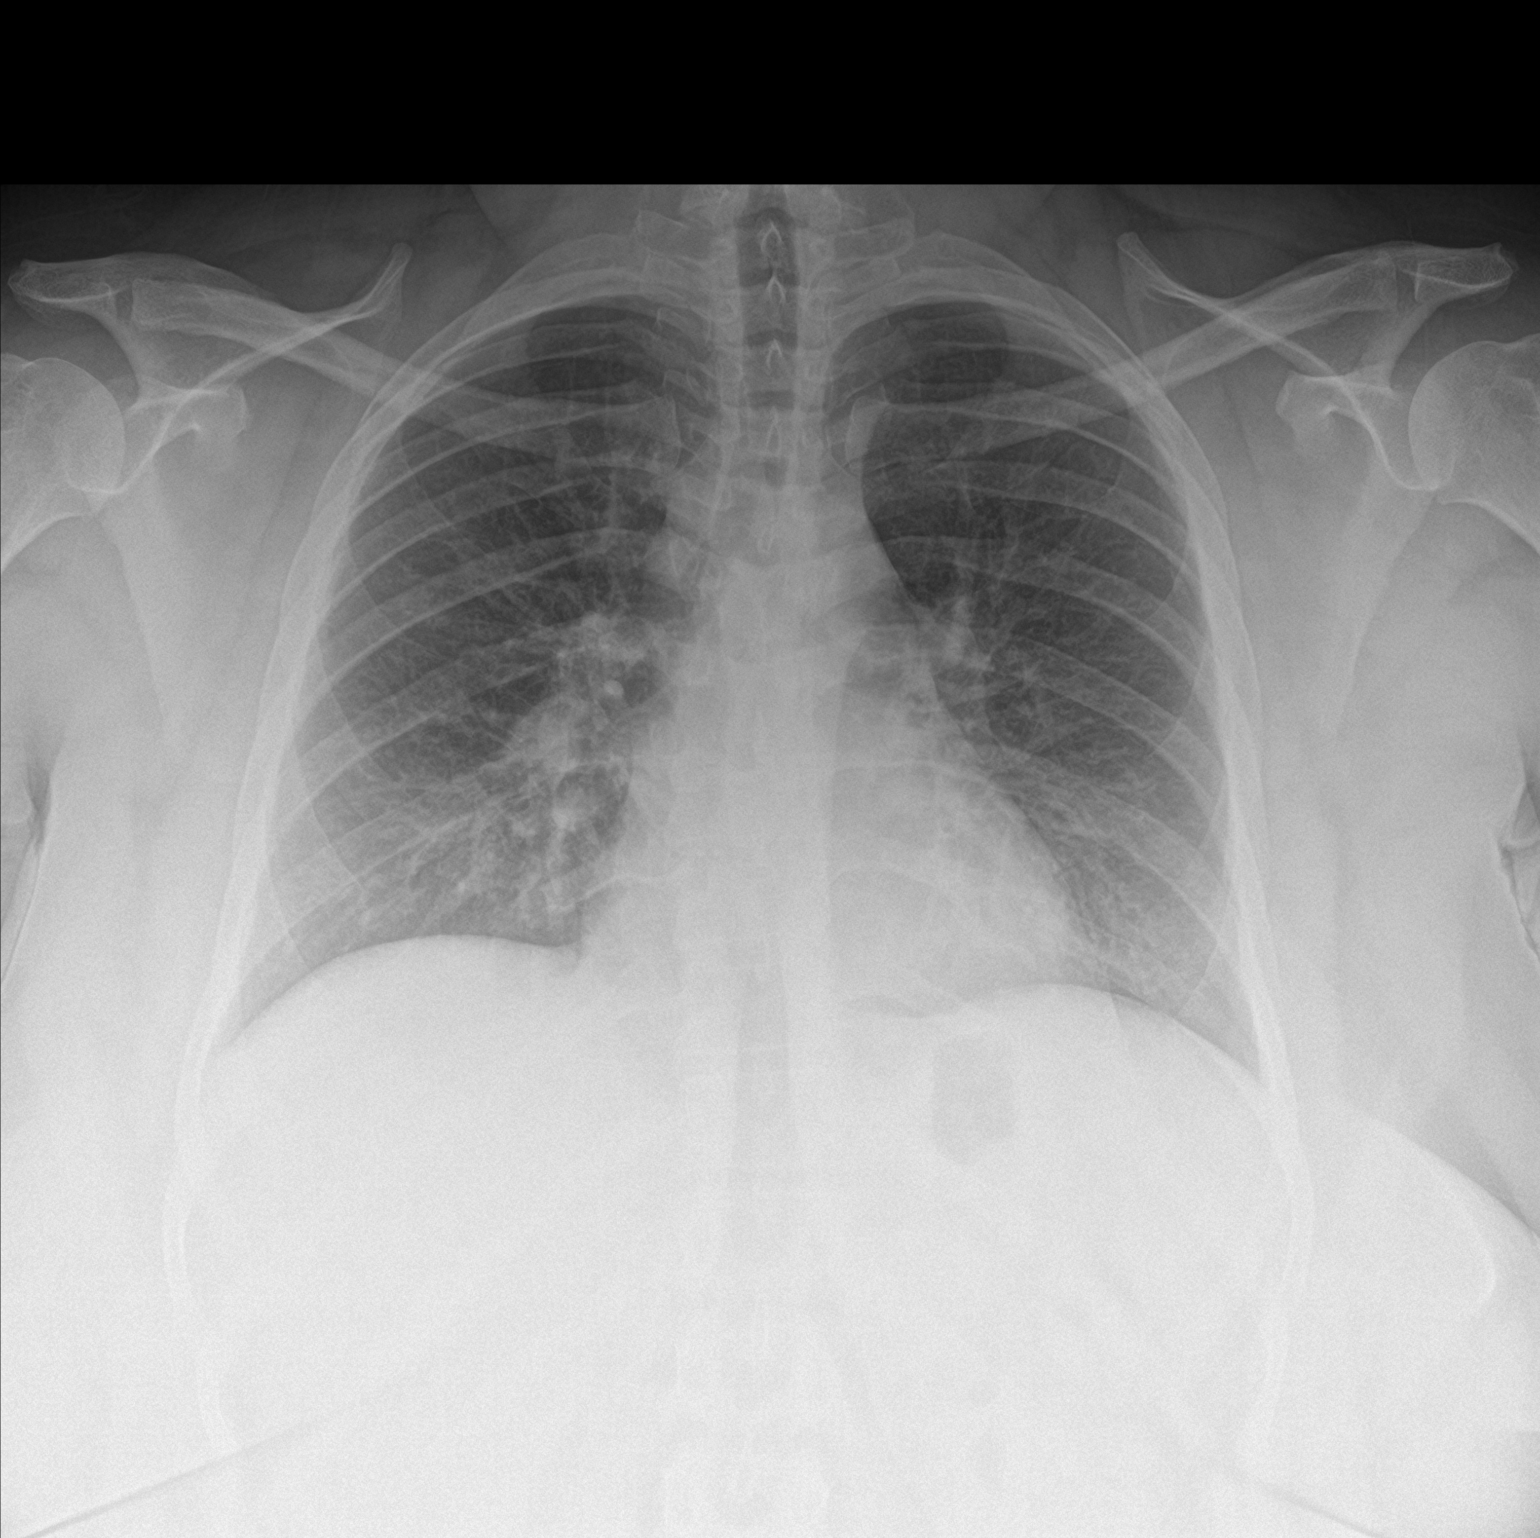

[chest lat]
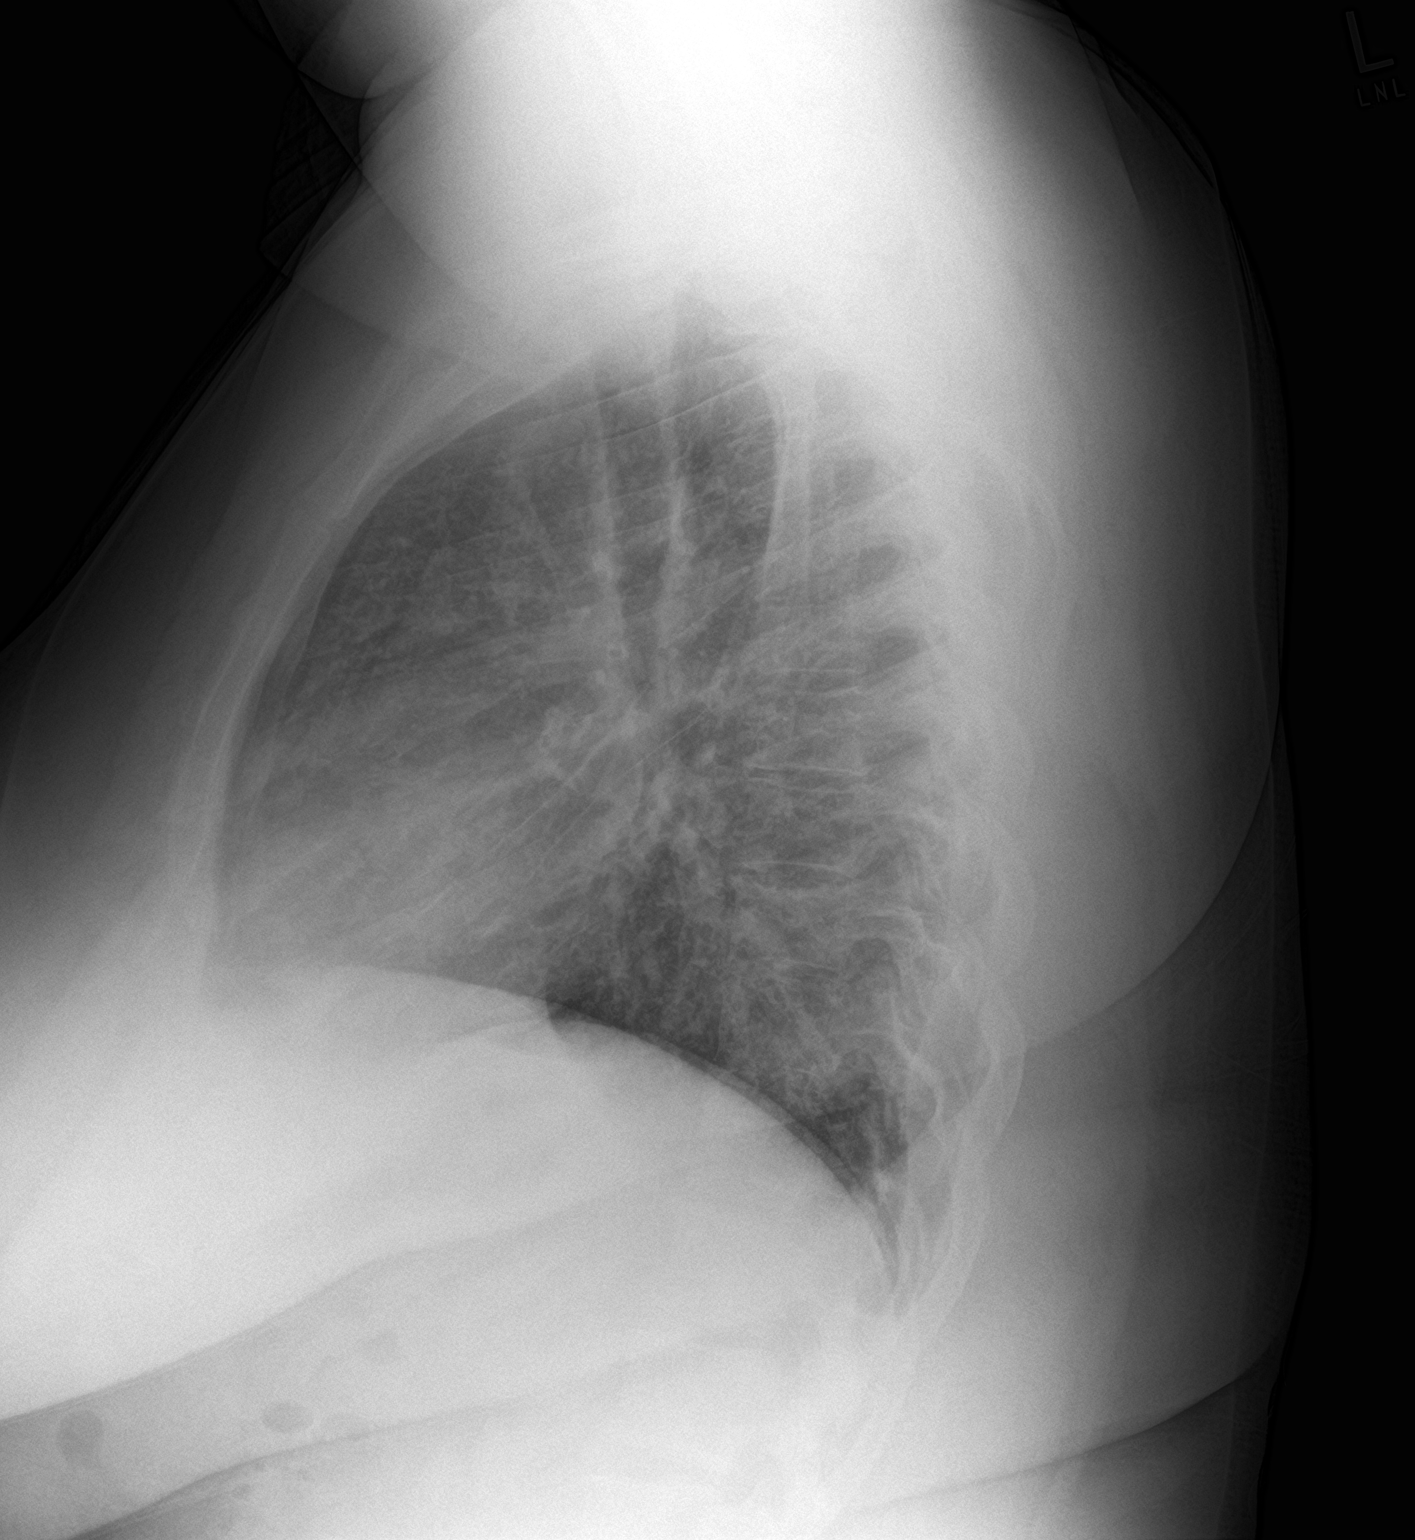

[2 of 2 positions shown; findings below may reference images not displayed]

FINDINGS: The heart is normal in size. The mediastinal and hilar contours
appear normal in stable. Peribronchial thickening and increased
interstitial markings appear chronic and are likely due to smoking.
Superimposed bronchitis is possible. No definite infiltrates
effusions. The bony thorax is intact.
IMPRESSION: Chronic bronchitic type changes suggest smoking changes.
Superimposed bronchitis is possible.

No infiltrates or effusions.

## 2019-07-15 ENCOUNTER — Other Ambulatory Visit: Payer: Self-pay

## 2019-07-15 DIAGNOSIS — Z20822 Contact with and (suspected) exposure to covid-19: Secondary | ICD-10-CM

## 2019-07-16 ENCOUNTER — Telehealth: Payer: Self-pay

## 2019-07-16 LAB — NOVEL CORONAVIRUS, NAA: SARS-CoV-2, NAA: NOT DETECTED

## 2019-07-16 NOTE — Telephone Encounter (Signed)
Patient called in requesting [place] lab results - DOB/Address verified - results given. Assisted with resetting MyChart account.

## 2019-07-20 ENCOUNTER — Ambulatory Visit: Payer: Self-pay | Admitting: *Deleted

## 2019-07-20 NOTE — Telephone Encounter (Signed)
Pt states her daughter tested positive for covid. Pt calling with questions regarding quarantine protocol for herself. Questions answered to pts satisfaction. Also advised to call PCP for varication.

## 2019-07-22 ENCOUNTER — Emergency Department (HOSPITAL_COMMUNITY): Payer: Medicare Other

## 2019-07-22 ENCOUNTER — Emergency Department (HOSPITAL_COMMUNITY)
Admission: EM | Admit: 2019-07-22 | Discharge: 2019-07-22 | Disposition: A | Discharge status: Home / Self Care | Payer: Medicare Other | Source: Home / Self Care | Attending: Emergency Medicine | Admitting: Emergency Medicine

## 2019-07-22 ENCOUNTER — Emergency Department (HOSPITAL_COMMUNITY)
Admission: EM | Admit: 2019-07-22 | Discharge: 2019-07-22 | Discharge status: Against Medical Advice | Payer: Medicare Other | Attending: Emergency Medicine | Admitting: Emergency Medicine

## 2019-07-22 ENCOUNTER — Other Ambulatory Visit: Payer: Self-pay

## 2019-07-22 ENCOUNTER — Telehealth: Payer: Self-pay

## 2019-07-22 ENCOUNTER — Encounter (HOSPITAL_COMMUNITY): Payer: Self-pay | Admitting: Emergency Medicine

## 2019-07-22 DIAGNOSIS — Z20828 Contact with and (suspected) exposure to other viral communicable diseases: Secondary | ICD-10-CM | POA: Insufficient documentation

## 2019-07-22 DIAGNOSIS — R0789 Other chest pain: Secondary | ICD-10-CM | POA: Diagnosis present

## 2019-07-22 DIAGNOSIS — Z5321 Procedure and treatment not carried out due to patient leaving prior to being seen by health care provider: Secondary | ICD-10-CM | POA: Diagnosis not present

## 2019-07-22 DIAGNOSIS — R0602 Shortness of breath: Secondary | ICD-10-CM | POA: Diagnosis not present

## 2019-07-22 DIAGNOSIS — J40 Bronchitis, not specified as acute or chronic: Secondary | ICD-10-CM

## 2019-07-22 LAB — BASIC METABOLIC PANEL
Anion gap: 8 (ref 5–15)
BUN: 6 mg/dL (ref 6–20)
CO2: 22 mmol/L (ref 22–32)
Calcium: 9.1 mg/dL (ref 8.9–10.3)
Chloride: 106 mmol/L (ref 98–111)
Creatinine, Ser: 0.89 mg/dL (ref 0.44–1.00)
GFR calc Af Amer: >60 mL/min (ref >60)
GFR calc non Af Amer: >60 mL/min (ref >60)
Glucose, Bld: 104 mg/dL — ABNORMAL HIGH (ref 70–99)
Potassium: 3.4 mmol/L — ABNORMAL LOW (ref 3.5–5.1)
Sodium: 136 mmol/L (ref 135–145)

## 2019-07-22 LAB — CBC
HCT: 41.8 % (ref 36.0–46.0)
Hemoglobin: 13.9 g/dL (ref 12.0–15.0)
MCH: 29.8 pg (ref 26.0–34.0)
MCHC: 33.3 g/dL (ref 30.0–36.0)
MCV: 89.5 fL (ref 80.0–100.0)
Platelets: 267 10*3/uL (ref 150–400)
RBC: 4.67 MIL/uL (ref 3.87–5.11)
RDW: 13.5 % (ref 11.5–15.5)
WBC: 6.8 10*3/uL (ref 4.0–10.5)
nRBC: 0 % (ref 0.0–0.2)

## 2019-07-22 LAB — TROPONIN I (HIGH SENSITIVITY): Troponin I (High Sensitivity): 3 ng/L (ref <18)

## 2019-07-22 LAB — PROTIME-INR
INR: 1.1 (ref 0.8–1.2)
Prothrombin Time: 13.8 seconds (ref 11.4–15.2)

## 2019-07-22 LAB — I-STAT BETA HCG BLOOD, ED (MC, WL, AP ONLY): I-stat hCG, quantitative: <5 m[IU]/mL (ref <5)

## 2019-07-22 MED ORDER — PREDNISONE 20 MG PO TABS: 40.0000 mg | ORAL_TABLET | Freq: Every day | ORAL | 0 refills | Status: DC

## 2019-07-22 MED ORDER — SODIUM CHLORIDE 0.9% FLUSH: 3.0000 mL | Freq: Once | INTRAVENOUS | Status: DC

## 2019-07-22 MED ORDER — DEXTROMETHORPHAN HBR 15 MG/5ML PO SYRP: 10.0000 mL | ORAL_SOLUTION | Freq: Four times a day (QID) | ORAL | 0 refills | Status: DC | PRN

## 2019-07-22 MED ORDER — ALBUTEROL SULFATE HFA 108 (90 BASE) MCG/ACT IN AERS
INHALATION_SPRAY | RESPIRATORY_TRACT | Status: AC
  Filled 2019-07-22: qty 6.7

## 2019-07-22 MED ORDER — ALBUTEROL SULFATE HFA 108 (90 BASE) MCG/ACT IN AERS: 1.0000 | INHALATION_SPRAY | Freq: Four times a day (QID) | RESPIRATORY_TRACT | 0 refills | Status: AC | PRN

## 2019-07-22 NOTE — ED Triage Notes (Addendum)
Came here last night but left due to wait. Reports SOB while at rest. She had chest pain last night but gone away. 38 yo dtr pos for covid. Her 44 sister on vent at [facility]. She has hx of asthma. Sats 100%. PT is a 1/2 pack day smoker.

## 2019-07-22 NOTE — ED Notes (Signed)
Patient verbalizes understanding of discharge instructions. Opportunity for questioning and answers were provided. Armband removed by staff, pt discharged from ED.  

## 2019-07-22 NOTE — ED Provider Notes (Signed)
[facility] EMERGENCY DEPARTMENT Provider Note   CSN: [id number] Arrival date & time: 07/22/19  1105     History   Chief Complaint Chief Complaint  Patient presents with  . Shortness of Breath    HPI Stacy Blackwell is a 38 y.o. female who presents with shortness of breath.  Past medical history significant for obesity, tobacco use, acid reflux, asthma.  Patient states that she became short of breath and started wheezing this morning after moving trash cans from the curb.  She states she feels better now after using a breathing treatment at home but wanted to get checked out.  She has a cough productive of green sputum.  She was also here yesterday for shortness of breath and she states that this was due to anxiety.  States that her 64 year old daughter was diagnosed with COVID is quarantining at home and her sister has COVID is currently at [facility] on a ventilator.  She denies fever, chills, body aches, headache, sore throat, abdominal pain, nausea, vomiting, or diarrhea.  She is a current smoker.  She is out of her inhaler.  States that it feels somewhat like pneumonia that she is had in the past.     HPI  Past Medical History:  Diagnosis Date  . Acid reflux   . Asthma   . Depression    doing good, no meds  . Obesity   . Ovarian cyst   . Vertigo     Patient Active Problem List   Diagnosis Date Noted  . NSVD (normal spontaneous vaginal delivery) 01/14/2015  . Premature rupture of membranes 01/13/2015  . Active labor at term 01/13/2015  . Polyhydramnios in third trimester   . [redacted] weeks gestation of pregnancy   . Polyhydramnios in third trimester, antepartum   . [redacted] weeks gestation of pregnancy   . Large for dates   . [redacted] weeks gestation of pregnancy   . Abdominal pain in pregnancy, antepartum   . [redacted] weeks gestation of pregnancy   . Evaluate anatomy not seen on prior sonogram   . Obesity in pregnancy with antepartum complication   . [redacted] weeks  gestation of pregnancy   . Encounter for fetal anatomic survey   . [redacted] weeks gestation of pregnancy   . 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 Allergic rhinitis 07/01/2014  . GERD (gastroesophageal reflux disease) 07/01/2014  . Tobacco use disorder 07/01/2014    Past Surgical History:  Procedure Laterality Date  . CYST EXCISION Left 1987     OB History    Gravida  2   Para  2   Term  2   Preterm  0   AB  0   Living  2     SAB  0   TAB  0   Ectopic  0   Multiple  0   Live Births  2            Home Medications    Prior to Admission medications   Medication Sig Start Date End Date Taking? Authorizing Provider  acetaminophen (TYLENOL) 325 MG tablet Take 2 tablets (650 mg total) by mouth every 6 (six) hours as needed. 11/08/18   Khatri, Hina, PA-C  albuterol (PROVENTIL HFA;VENTOLIN HFA) 108 (90 Base) MCG/ACT inhaler Inhale 1-2 puffs into the lungs every 6 (six) hours as needed for wheezing or shortness of breath. 04/06/17   Ward, Ozella Almond, PA-C  albuterol (PROVENTIL) (2.5 MG/3ML) 0.083% nebulizer solution Take 2.5 mg by  nebulization every 6 (six) hours as needed for wheezing or shortness of breath.    [provider]  albuterol (VENTOLIN HFA) 108 (90 Base) MCG/ACT inhaler Inhale 1-2 puffs into the lungs every 6 (six) hours as needed for wheezing or shortness of breath. 07/22/19   Bethel Born, PA-C  benzonatate (TESSALON) 100 MG capsule Take 2 capsules (200 mg total) by mouth every 8 (eight) hours. 11/08/18   Khatri, Hina, PA-C  cetirizine (ZYRTEC) 5 MG tablet Take 1 tablet (5 mg total) by mouth daily. 11/08/18   Khatri, Hina, PA-C  chlorpheniramine-HYDROcodone (TUSSIONEX PENNKINETIC ER) 10-8 MG/5ML SUER Take 5 mLs by mouth every 12 (twelve) hours as needed for cough. 09/08/18   Belinda Fisher, PA-C  dextromethorphan 15 MG/5ML syrup Take 10 mLs (30 mg total) by mouth 4 (four) times daily as needed for cough. 07/22/19   Bethel Born, PA-C   Dextromethorphan-Guaifenesin (ROBITUSSIN SUGAR FREE) 10-100 MG/5ML liquid Take 10 mLs by mouth every 6 (six) hours as needed (cold symptoms).    [provider]  fluticasone (FLONASE) 50 MCG/ACT nasal spray Place 1 spray into both nostrils daily. 11/08/18   Khatri, Hina, PA-C  ibuprofen (ADVIL,MOTRIN) 600 MG tablet Take 1 tablet (600 mg total) by mouth every 6 (six) hours as needed (pain). 04/21/17   Wojeck, Hinton Dyer, NP  ipratropium (ATROVENT) 0.06 % nasal spray Place 2 sprays into both nostrils 4 (four) times daily. 09/08/18   Cathie Hoops, Amy V, PA-C  medroxyPROGESTERone (DEPO-PROVERA) 150 MG/ML injection Inject 1 mL (150 mg total) into the muscle every 3 (three) months. 01/15/15   Montez Morita, CNM  ondansetron (ZOFRAN ODT) 4 MG disintegrating tablet Take 1 tablet (4 mg total) by mouth every 8 (eight) hours as needed for nausea or vomiting. 11/08/18   Khatri, Hina, PA-C  predniSONE (DELTASONE) 20 MG tablet Take 2 tablets (40 mg total) by mouth daily. 07/22/19   Bethel Born, PA-C    Family History Family History  Problem Relation Age of Onset  . Asthma Mother   . Diabetes Mother   . Heart disease Mother        CHF  . Asthma Sister   . Diabetes Sister   . Heart disease Sister   . Hearing loss Neg Hx     Social History Social History   Tobacco Use  . Smoking status: Current Some Day Smoker    Packs/day: 0.25    Years: 10.00    Pack years: 2.50    Types: Cigarettes  . Smokeless tobacco: Never Used  Substance Use Topics  . Alcohol use: Yes  . Drug use: No     Allergies   Amoxicillin and Hydrocodone   Review of Systems Review of Systems  Constitutional: Negative for chills and fever.  HENT: Negative for congestion and sore throat.   Respiratory: Positive for cough, shortness of breath and wheezing.   Cardiovascular: Negative for chest pain.  Allergic/Immunologic: Negative for immunocompromised state.  Neurological: Negative for headaches.  All other systems  reviewed and are negative.    Physical Exam Updated Vital Signs BP 128/72   Pulse 87   Temp 98.7 F (37.1 C) (Oral)   SpO2 100%   Physical Exam Vitals signs and nursing note reviewed.  Constitutional:      General: She is not in acute distress.    Appearance: She is well-developed. She is obese. She is not ill-appearing.  HENT:     Head: Normocephalic and atraumatic.  Right Ear: Tympanic membrane normal.     Left Ear: Tympanic membrane normal.     Nose: Nose normal.  Eyes:     General: No scleral icterus.       Right eye: No discharge.        Left eye: No discharge.     Conjunctiva/sclera: Conjunctivae normal.     Pupils: Pupils are equal, round, and reactive to light.  Neck:     Musculoskeletal: Normal range of motion.  Cardiovascular:     Rate and Rhythm: Normal rate and regular rhythm.  Pulmonary:     Effort: Pulmonary effort is normal. No respiratory distress.     Breath sounds: Wheezing present.  Abdominal:     General: There is no distension.     Palpations: Abdomen is soft.     Tenderness: There is no abdominal tenderness.  Musculoskeletal:     Right lower leg: No edema.     Left lower leg: No edema.  Skin:    General: Skin is warm and dry.  Neurological:     Mental Status: She is alert and oriented to person, place, and time.  Psychiatric:        Behavior: Behavior normal.      ED Treatments / Results  Labs (all labs ordered are listed, but only abnormal results are displayed) Labs Reviewed  NOVEL CORONAVIRUS, NAA (HOSP ORDER, SEND-OUT TO REF LAB; TAT 18-24 HRS)    EKG None  Radiology Dg Chest 2 View  Result Date: 07/22/2019 CLINICAL DATA:  Central chest pain with shortness of breath. EXAM: CHEST - 2 VIEW COMPARISON:  09/07/2018 FINDINGS: The cardiomediastinal contours are normal. Bronchial thickening is unchanged from prior. Pulmonary vasculature is normal. No consolidation, pleural effusion, or pneumothorax. No acute osseous abnormalities  are seen. IMPRESSION: Unchanged bronchial thickening which may be asthma, bronchitis, or smoking related. No new abnormality. Electronically Signed   By: Narda Rutherford M.D.   On: 07/22/2019 02:44   Dg Chest Port 1 View  Result Date: 07/22/2019 CLINICAL DATA:  Cough, shortness of breath EXAM: PORTABLE CHEST 1 VIEW COMPARISON:  07/22/2019 FINDINGS: Heart is borderline in size. Lungs clear. No effusions. No acute bony abnormality. IMPRESSION: No active disease. Electronically Signed   By: Charlett Nose M.D.   On: 07/22/2019 12:02    Procedures Procedures (including critical care time)  Medications Ordered in ED Medications - No data to display   Initial Impression / Assessment and Plan / ED Course  I have reviewed the triage vital signs and the nursing notes.  Pertinent labs & imaging results that were available during my care of the patient were reviewed by me and considered in my medical decision making (see chart for details).  38 year old female presents with shortness of breath, cough, wheezing which started this morning after going outside and moving trash cans from curbside.  Her vital signs are normal.  Her oxygen is 100%.  There is her exam is remarkable for mild expiratory wheezes.  He does have a history of asthma and bronchitis and it is a current smoker.  Her daughter has Coban and is quarantining at home.  She did test negative for COVID 1 week ago.  Due to ongoing contact with a positive COVID patient and new symptoms we will retest her today.  Repeat chest x-ray obtained in triage is negative for pneumonia.  We will treat for bronchitis with prednisone, albuterol, cough syrup.  She was given return precautions.  Of note the patient  called the ED after discharge asking a secretary for antibiotics.  She was advised that her symptoms are likely viral and she does not need antibiotics at this time.  Final Clinical Impressions(s) / ED Diagnoses   Final diagnoses:  Bronchitis     ED Discharge Orders         Ordered    dextromethorphan 15 MG/5ML syrup  4 times daily PRN     07/22/19 1223    predniSONE (DELTASONE) 20 MG tablet  Daily     07/22/19 1223    albuterol (VENTOLIN HFA) 108 (90 Base) MCG/ACT inhaler  Every 6 hours PRN     07/22/19 1223           Bethel BornGekas, Haylyn Halberg Marie, PA-C 07/22/19 1624    Benjiman CorePickering, Nathan, MD 07/30/19 [phone]

## 2019-07-22 NOTE — Telephone Encounter (Signed)
Received call from patient with questions regarding her feeling "chills."  Advised her that is a symptom of Covid however she currently denies a fever.  Advised to wait for test results and if symptoms worsen to contact PCP or EMS for emergent situations.

## 2019-07-22 NOTE — ED Notes (Signed)
Pt called for vitals recheck with no answer and patient not visably seen in lobby

## 2019-07-22 NOTE — ED Triage Notes (Signed)
Patient arrived with EMS from home reports central chest pain with mild SOB and occasional dry cough onset this evening , denies emesis or diaphoresis .

## 2019-07-22 NOTE — Telephone Encounter (Signed)
Received call from patient to check test results.  Advised no results at this time.

## 2019-07-22 NOTE — Discharge Instructions (Addendum)
Use inhaler as needed for shortness of breath and wheezing Use Tussin for cough as directed Take Prednisone for 5 days for inflammation Please quarantine until you get results of your COVID test and return if worsening

## 2019-07-22 NOTE — ED Notes (Signed)
Patient stated that she was going home because she did not want to wait this long. This NT advised the pt to stay and be seen by one of our ED Providers however pt insisted on leaving.

## 2019-07-23 ENCOUNTER — Telehealth: Payer: Self-pay

## 2019-07-23 ENCOUNTER — Telehealth: Payer: Self-pay | Admitting: *Deleted

## 2019-07-23 LAB — NOVEL CORONAVIRUS, NAA (HOSP ORDER, SEND-OUT TO REF LAB; TAT 18-24 HRS): SARS-CoV-2, NAA: NOT DETECTED

## 2019-07-23 NOTE — Telephone Encounter (Signed)
Patient called to check covid results.  Advised no results at this time.

## 2019-07-23 NOTE — Telephone Encounter (Signed)
Patient called to obtain COVID test results from 07/22/2019.  Patient notified of negative results.

## 2019-08-12 ENCOUNTER — Other Ambulatory Visit: Payer: Self-pay | Admitting: Internal Medicine

## 2019-08-12 DIAGNOSIS — N644 Mastodynia: Secondary | ICD-10-CM

## 2019-08-20 ENCOUNTER — Ambulatory Visit
Admission: RE | Admit: 2019-08-20 | Discharge: 2019-08-20 | Disposition: A | Discharge status: Home / Self Care | Payer: Medicare Other | Source: Ambulatory Visit | Attending: Internal Medicine | Admitting: Internal Medicine

## 2019-08-20 ENCOUNTER — Other Ambulatory Visit: Payer: Self-pay

## 2019-08-20 ENCOUNTER — Ambulatory Visit: Admission: RE | Admit: 2019-08-20 | Payer: Medicare Other | Source: Ambulatory Visit

## 2019-08-20 DIAGNOSIS — N644 Mastodynia: Secondary | ICD-10-CM

## 2019-08-28 ENCOUNTER — Emergency Department (HOSPITAL_COMMUNITY): Payer: Medicare Other

## 2019-08-28 ENCOUNTER — Encounter (HOSPITAL_COMMUNITY): Payer: Self-pay | Admitting: *Deleted

## 2019-08-28 ENCOUNTER — Other Ambulatory Visit: Payer: Self-pay

## 2019-08-28 ENCOUNTER — Emergency Department (HOSPITAL_COMMUNITY)
Admission: EM | Admit: 2019-08-28 | Discharge: 2019-08-28 | Disposition: A | Discharge status: Home / Self Care | Payer: Medicare Other | Attending: Emergency Medicine | Admitting: Emergency Medicine

## 2019-08-28 DIAGNOSIS — Z79899 Other long term (current) drug therapy: Secondary | ICD-10-CM | POA: Diagnosis not present

## 2019-08-28 DIAGNOSIS — Z20828 Contact with and (suspected) exposure to other viral communicable diseases: Secondary | ICD-10-CM | POA: Diagnosis not present

## 2019-08-28 DIAGNOSIS — F17210 Nicotine dependence, cigarettes, uncomplicated: Secondary | ICD-10-CM | POA: Diagnosis not present

## 2019-08-28 DIAGNOSIS — J45901 Unspecified asthma with (acute) exacerbation: Secondary | ICD-10-CM | POA: Insufficient documentation

## 2019-08-28 DIAGNOSIS — R002 Palpitations: Secondary | ICD-10-CM | POA: Insufficient documentation

## 2019-08-28 DIAGNOSIS — R110 Nausea: Secondary | ICD-10-CM | POA: Diagnosis not present

## 2019-08-28 DIAGNOSIS — R05 Cough: Secondary | ICD-10-CM | POA: Diagnosis present

## 2019-08-28 LAB — CBC WITH DIFFERENTIAL/PLATELET
Abs Immature Granulocytes: 0.02 10*3/uL (ref 0.00–0.07)
Basophils Absolute: 0 10*3/uL (ref 0.0–0.1)
Basophils Relative: 0 %
Eosinophils Absolute: 0.1 10*3/uL (ref 0.0–0.5)
Eosinophils Relative: 2 %
HCT: 41.9 % (ref 36.0–46.0)
Hemoglobin: 13.4 g/dL (ref 12.0–15.0)
Immature Granulocytes: 0 %
Lymphocytes Relative: 32 %
Lymphs Abs: 1.9 10*3/uL (ref 0.7–4.0)
MCH: 29.3 pg (ref 26.0–34.0)
MCHC: 32 g/dL (ref 30.0–36.0)
MCV: 91.7 fL (ref 80.0–100.0)
Monocytes Absolute: 0.3 10*3/uL (ref 0.1–1.0)
Monocytes Relative: 6 %
Neutro Abs: 3.6 10*3/uL (ref 1.7–7.7)
Neutrophils Relative %: 60 %
Platelets: 254 10*3/uL (ref 150–400)
RBC: 4.57 MIL/uL (ref 3.87–5.11)
RDW: 14.4 % (ref 11.5–15.5)
WBC: 6 10*3/uL (ref 4.0–10.5)
nRBC: 0 % (ref 0.0–0.2)

## 2019-08-28 LAB — BASIC METABOLIC PANEL
Anion gap: 13 (ref 5–15)
BUN: 5 mg/dL — ABNORMAL LOW (ref 6–20)
CO2: 19 mmol/L — ABNORMAL LOW (ref 22–32)
Calcium: 9.1 mg/dL (ref 8.9–10.3)
Chloride: 105 mmol/L (ref 98–111)
Creatinine, Ser: 1.02 mg/dL — ABNORMAL HIGH (ref 0.44–1.00)
GFR calc Af Amer: >60 mL/min (ref >60)
GFR calc non Af Amer: >60 mL/min (ref >60)
Glucose, Bld: 101 mg/dL — ABNORMAL HIGH (ref 70–99)
Potassium: 3.6 mmol/L (ref 3.5–5.1)
Sodium: 137 mmol/L (ref 135–145)

## 2019-08-28 LAB — HEPATIC FUNCTION PANEL
ALT: 21 U/L (ref 0–44)
AST: 18 U/L (ref 15–41)
Albumin: 3.6 g/dL (ref 3.5–5.0)
Alkaline Phosphatase: 51 U/L (ref 38–126)
Bilirubin, Direct: 0.1 mg/dL (ref 0.0–0.2)
Indirect Bilirubin: 0.9 mg/dL (ref 0.3–0.9)
Total Bilirubin: 1 mg/dL (ref 0.3–1.2)
Total Protein: 7 g/dL (ref 6.5–8.1)

## 2019-08-28 LAB — LIPASE, BLOOD: Lipase: 17 U/L (ref 11–51)

## 2019-08-28 LAB — I-STAT BETA HCG BLOOD, ED (MC, WL, AP ONLY): I-stat hCG, quantitative: <5 m[IU]/mL (ref <5)

## 2019-08-28 LAB — SARS CORONAVIRUS 2 (TAT 6-24 HRS): SARS Coronavirus 2: NEGATIVE

## 2019-08-28 MED ORDER — ALBUTEROL SULFATE HFA 108 (90 BASE) MCG/ACT IN AERS
4.0000 | INHALATION_SPRAY | Freq: Once | RESPIRATORY_TRACT | Status: AC
  Administered 2019-08-28: 09:00:00 4 via RESPIRATORY_TRACT
  Filled 2019-08-28: qty 6.7

## 2019-08-28 MED ORDER — AEROCHAMBER PLUS FLO-VU LARGE MISC
Status: AC
  Administered 2019-08-28: 1
  Filled 2019-08-28: qty 1

## 2019-08-28 MED ORDER — PREDNISONE 20 MG PO TABS
40.0000 mg | ORAL_TABLET | Freq: Once | ORAL | Status: AC
  Administered 2019-08-28: 40 mg via ORAL
  Filled 2019-08-28: qty 2

## 2019-08-28 MED ORDER — PREDNISONE 20 MG PO TABS: 40.0000 mg | ORAL_TABLET | Freq: Every day | ORAL | 0 refills | Status: AC

## 2019-08-28 NOTE — ED Triage Notes (Signed)
PT reports her heart was beating fast starting at 2200 Friday night . Pt also reports nausea. Pt also reports a HA that is new. PT went to PCP Friday was was told she had gas. Pt started taking Pepcid  and muciex for the cough. Pt denies any pain.  EMS vitals 186/91 .

## 2019-08-28 NOTE — ED Provider Notes (Signed)
[facility] EMERGENCY DEPARTMENT Provider Note   CSN: [id number] Arrival date & time: 08/28/19  4098     History   Chief Complaint Chief Complaint  Patient presents with   Cough   Nausea    HPI Stacy Blackwell is a 38 y.o. female.     The history is provided by the patient.  Cough Cough characteristics:  Non-productive Sputum characteristics:  Nondescript Severity:  Mild Timing:  Intermittent Chronicity:  New Context: not sick contacts   Relieved by:  Beta-agonist inhaler Worsened by:  Nothing Associated symptoms: wheezing   Associated symptoms: no chest pain, no chills, no diaphoresis, no ear fullness, no ear pain, no eye discharge, no fever, no rash, no shortness of breath and no sore throat   Risk factors: no recent infection     Past Medical History:  Diagnosis Date   Acid reflux    Asthma    Depression    doing good, no meds   Obesity    Ovarian cyst    Vertigo     Patient Active Problem List   Diagnosis Date Noted   NSVD (normal spontaneous vaginal delivery) 01/14/2015   Premature rupture of membranes 01/13/2015   Active labor at term 01/13/2015   Polyhydramnios in third trimester    [redacted] weeks gestation of pregnancy    Polyhydramnios in third trimester, antepartum    [redacted] weeks gestation of pregnancy    Large for dates    [redacted] weeks gestation of pregnancy    Abdominal pain in pregnancy, antepartum    [redacted] weeks gestation of pregnancy    Evaluate anatomy not seen on prior sonogram    Obesity in pregnancy with antepartum complication    [redacted] weeks gestation of pregnancy    Encounter for fetal anatomic survey    [redacted] weeks gestation of pregnancy    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 Authorizing Provider  acetaminophen (TYLENOL) 325 MG tablet Take 2 tablets (650 mg total) by mouth every 6 (six) hours as needed. 11/08/18   Khatri, Hina, PA-C  albuterol (PROVENTIL HFA;VENTOLIN HFA) 108 (90 Base) MCG/ACT inhaler Inhale 1-2 puffs into the lungs every 6 (six) hours as needed for wheezing or shortness of breath. 04/06/17   Ward, Chase Picket, PA-C  albuterol (PROVENTIL) (2.5 MG/3ML) 0.083% nebulizer solution Take 2.5 mg by nebulization every 6 (six) hours as needed for wheezing or shortness of breath.    [provider]  albuterol (VENTOLIN HFA) 108 (90 Base) MCG/ACT inhaler Inhale 1-2 puffs into the lungs every 6 (six) hours as needed for wheezing or shortness of breath. 07/22/19   Bethel Born, PA-C  benzonatate (TESSALON) 100 MG capsule Take 2 capsules (200 mg total) by mouth every 8 (eight) hours. 11/08/18  Khatri, Hina, PA-C  cetirizine (ZYRTEC) 5 MG tablet Take 1 tablet (5 mg total) by mouth daily. 11/08/18   Khatri, Hina, PA-C  chlorpheniramine-HYDROcodone (TUSSIONEX PENNKINETIC ER) 10-8 MG/5ML SUER Take 5 mLs by mouth every 12 (twelve) hours as needed for cough. 09/08/18   Belinda Fisher, PA-C  dextromethorphan 15 MG/5ML syrup Take 10 mLs (30 mg total) by mouth 4 (four) times daily as needed for cough. 07/22/19   Bethel Born, PA-C  Dextromethorphan-Guaifenesin (ROBITUSSIN SUGAR FREE) 10-100 MG/5ML liquid Take 10 mLs by mouth every 6 (six) hours as needed (cold symptoms).    [provider]  fluticasone (FLONASE) 50 MCG/ACT nasal spray Place 1 spray into both nostrils daily. 11/08/18   Khatri, Hina, PA-C  ibuprofen (ADVIL,MOTRIN) 600 MG tablet Take 1 tablet (600 mg total) by mouth every 6 (six) hours as needed  (pain). 04/21/17   Wojeck, Hinton Dyer, NP  ipratropium (ATROVENT) 0.06 % nasal spray Place 2 sprays into both nostrils 4 (four) times daily. 09/08/18   Cathie Hoops, Amy V, PA-C  medroxyPROGESTERone (DEPO-PROVERA) 150 MG/ML injection Inject 1 mL (150 mg total) into the muscle every 3 (three) months. 01/15/15   Montez Morita, CNM  ondansetron (ZOFRAN ODT) 4 MG disintegrating tablet Take 1 tablet (4 mg total) by mouth every 8 (eight) hours as needed for nausea or vomiting. 11/08/18   Khatri, Hina, PA-C  predniSONE (DELTASONE) 20 MG tablet Take 2 tablets (40 mg total) by mouth daily for 4 days. 08/28/19 09/01/19  Virgina Norfolk, DO    Family History Family History  Problem Relation Age of Onset   Asthma Mother    Diabetes Mother    Heart disease Mother        CHF   Asthma Sister    Diabetes Sister    Heart disease Sister    Hearing loss Neg Hx     Social History Social History   Tobacco Use   Smoking status: Current Some Day Smoker    Packs/day: 0.25    Years: 10.00    Pack years: 2.50    Types: Cigarettes   Smokeless tobacco: Never Used  Substance Use Topics   Alcohol use: Yes   Drug use: No     Allergies   Amoxicillin and Hydrocodone   Review of Systems Review of Systems  Constitutional: Negative for chills, diaphoresis and fever.  HENT: Negative for ear pain and sore throat.   Eyes: Negative for pain, discharge and visual disturbance.  Respiratory: Positive for cough and wheezing. Negative for shortness of breath.   Cardiovascular: Positive for palpitations. Negative for chest pain.  Gastrointestinal: Positive for nausea. Negative for abdominal pain and vomiting.  Genitourinary: Negative for dysuria and hematuria.  Musculoskeletal: Negative for arthralgias and back pain.  Skin: Negative for color change and rash.  Neurological: Negative for seizures and syncope.  All other systems reviewed and are negative.    Physical Exam Updated Vital Signs  ED Triage Vitals    Enc Vitals Group     BP 08/28/19 0736 (!) 126/53     Pulse Rate 08/28/19 0736 87     Resp 08/28/19 0736 17     Temp 08/28/19 0736 98.3 F (36.8 C)     Temp Source 08/28/19 0736 Oral     SpO2 08/28/19 0736 100 %     Weight --      Height --      Head Circumference --      Peak Flow --  Pain Score 08/28/19 0733 0     Pain Loc --      Pain Edu? --      Excl. in GC? --     Physical Exam Vitals signs and nursing note reviewed.  Constitutional:      General: She is not in acute distress.    Appearance: She is well-developed. She is not ill-appearing.  HENT:     Head: Normocephalic and atraumatic.     Nose: Nose normal.     Mouth/Throat:     Mouth: Mucous membranes are moist.  Eyes:     Extraocular Movements: Extraocular movements intact.     Conjunctiva/sclera: Conjunctivae normal.     Pupils: Pupils are equal, round, and reactive to light.  Neck:     Musculoskeletal: Normal range of motion and neck supple. No muscular tenderness.  Cardiovascular:     Rate and Rhythm: Normal rate and regular rhythm.     Pulses: Normal pulses.     Heart sounds: Normal heart sounds. No murmur.  Pulmonary:     Effort: Pulmonary effort is normal. No respiratory distress.     Breath sounds: Wheezing present.  Abdominal:     General: There is no distension.     Palpations: Abdomen is soft.     Tenderness: There is no abdominal tenderness.  Musculoskeletal: Normal range of motion.        General: No tenderness.  Skin:    General: Skin is warm and dry.  Neurological:     General: No focal deficit present.     Mental Status: She is alert and oriented to person, place, and time.  Psychiatric:        Mood and Affect: Mood normal.      ED Treatments / Results  Labs (all labs ordered are listed, but only abnormal results are displayed) Labs Reviewed  BASIC METABOLIC PANEL - Abnormal; Notable for the following components:      Result Value   CO2 19 (*)    Glucose, Bld 101 (*)     BUN 5 (*)    Creatinine, Ser 1.02 (*)    All other components within normal limits  SARS CORONAVIRUS 2 (TAT 6-24 HRS)  CBC WITH DIFFERENTIAL/PLATELET  HEPATIC FUNCTION PANEL  LIPASE, BLOOD  I-STAT BETA HCG BLOOD, ED ([facility], [facility], [facility] ONLY)    EKG None  Radiology Dg Chest Portable 1 View  Result Date: 08/28/2019 CLINICAL DATA:  Cough. EXAM: PORTABLE CHEST 1 VIEW COMPARISON:  07/22/2019 FINDINGS: Lungs are adequately inflated without consolidation or effusion. Mild stable cardiomegaly. Remainder of the exam is unchanged. IMPRESSION: No active disease. Electronically Signed   By: Elberta Fortisaniel  Boyle M.D.   On: 08/28/2019 08:50    Procedures Procedures (including critical care time)  Medications Ordered in ED Medications  predniSONE (DELTASONE) tablet 40 mg (has no administration in time range)  albuterol (VENTOLIN HFA) 108 (90 Base) MCG/ACT inhaler 4 puff (4 puffs Inhalation Given 08/28/19 0831)  AeroChamber Plus Flo-Vu Large MISC (1 each  Given 08/28/19 0831)     Initial Impression / Assessment and Plan / ED Course  I have reviewed the triage vital signs and the nursing notes.  Pertinent labs & imaging results that were available during my care of the patient were reviewed by me and considered in my medical decision making (see chart for details).     Stacy BeechamStephanie R Blackwell is a 38 year old female with history of reflux, asthma who presents to the ED with cough, nausea.  Has been using her home albuterol with some relief.  Has had some nausea but denies any chest pain, shortness of breath, abdominal pain.  Has some infectious type symptoms.  Likely respiratory in nature.  Possible asthma exacerbation given some wheezing on exam.  Had some palpitations after breathing treatment last night.  Has some scattered wheezing on exam but otherwise no signs of respiratory distress.  Denies any history of coronavirus or contact with someone with coronavirus.  Denies any fevers.  Overall suspect mild asthma  exacerbation but will evaluate for pneumonia, anemia, electrolyte abnormalities.  Will give albuterol and steroids.  Will reevaluate following lab work and imaging.  Lab work showed no significant electrolyte abnormality, anemia.  Chest x-ray showed no signs of infection or pneumothorax.  Pregnancy test negative.  Overall suspect mild asthma exacerbation.  Will give steroids.  Will test for coronavirus.  Discharged in good condition.  Understands self-isolation.  Given return precautions.  AILA TERRA was evaluated in Emergency Department on 08/28/2019 for the symptoms described in the history of present illness. She was evaluated in the context of the global COVID-19 pandemic, which necessitated consideration that the patient might be at risk for infection with the SARS-CoV-2 virus that causes COVID-19. Institutional protocols and algorithms that pertain to the evaluation of patients at risk for COVID-19 are in a state of rapid change based on information released by regulatory bodies including the CDC and federal and state organizations. These policies and algorithms were followed during the patient's care in the ED.  This chart was dictated using voice recognition software.  Despite best efforts to proofread,  errors can occur which can change the documentation meaning.    Final Clinical Impressions(s) / ED Diagnoses   Final diagnoses:  Mild asthma with exacerbation, unspecified whether persistent    ED Discharge Orders         Ordered    predniSONE (DELTASONE) 20 MG tablet  Daily     08/28/19 Gratz, Kenton, DO 08/28/19 [phone]

## 2019-08-28 NOTE — ED Notes (Signed)
Patient verbalizes understanding of discharge instructions . Opportunity for questions and answers were provided . Armband removed by staff ,Pt discharged from ED. W/C  offered at D/C  and Declined W/C at D/C and was escorted to lobby by RN.  

## 2019-08-28 NOTE — ED Triage Notes (Signed)
PT reports he has tested neg  x2 for COVID.

## 2019-08-28 NOTE — ED Triage Notes (Signed)
Pt reports her sister and daughter each tested positive for COVID.  Pt's  Sister was Adm to [facility].

## 2019-08-29 ENCOUNTER — Telehealth: Payer: Self-pay

## 2019-08-29 NOTE — Telephone Encounter (Signed)
Called and informed patient that test for Covid 19 was NEGATIVE. Discussed signs and symptoms of Covid 19 : fever, chills, respiratory symptoms, cough, ENT symptoms, sore throat, SOB, muscle pain, diarrhea, headache, loss of taste/smell, close exposure to COVID-19 patient. Pt instructed to call PCP if they develop the above signs and sx. Pt also instructed to call 911 if having respiratory issues/distress. Discussed MyChart enrollment. Pt verbalized understanding.  

## 2019-08-30 ENCOUNTER — Other Ambulatory Visit: Payer: Self-pay

## 2019-08-30 ENCOUNTER — Encounter (HOSPITAL_COMMUNITY): Payer: Self-pay | Admitting: Emergency Medicine

## 2019-08-30 ENCOUNTER — Ambulatory Visit (HOSPITAL_COMMUNITY)
Admission: EM | Admit: 2019-08-30 | Discharge: 2019-08-30 | Disposition: A | Discharge status: Home / Self Care | Payer: Medicare Other | Attending: Family Medicine | Admitting: Family Medicine

## 2019-08-30 DIAGNOSIS — N644 Mastodynia: Secondary | ICD-10-CM | POA: Diagnosis not present

## 2019-08-30 DIAGNOSIS — H938X3 Other specified disorders of ear, bilateral: Secondary | ICD-10-CM

## 2019-08-30 MED ORDER — IBUPROFEN 800 MG PO TABS: 800.0000 mg | ORAL_TABLET | Freq: Three times a day (TID) | ORAL | 0 refills | Status: DC

## 2019-08-30 MED ORDER — TRIAMCINOLONE ACETONIDE 55 MCG/ACT NA AERO: 2.0000 | INHALATION_SPRAY | Freq: Every day | NASAL | 0 refills | Status: AC

## 2019-08-30 MED ORDER — DOXYCYCLINE HYCLATE 100 MG PO CAPS: 100.0000 mg | ORAL_CAPSULE | Freq: Two times a day (BID) | ORAL | 0 refills | Status: DC

## 2019-08-30 NOTE — Discharge Instructions (Signed)
Push fluids to ensure adequate hydration and keep secretions thin.  Tylenol and/or ibuprofen as needed for pain or fevers. I have sent a different dosage of ibuprofen to use, as directed.  Complete course of antibiotics.  Stop flonase and use nasacort daily. Use plain mucinex, no DM. Continue with daily allergy medications.  You will be called to set up appointment to further evaluate your breast pain.  Apply warm compresses to the affected area.  If symptoms worsen or do not improve in the next week to return to be seen or to follow up with your PCP.

## 2019-08-30 NOTE — ED Provider Notes (Signed)
[facility]    CSN: [id number] Arrival date & time: 08/30/19  1336      History   Chief Complaint Chief Complaint  Patient presents with  . Insect Bite    HPI Stacy Blackwell is a 38 y.o. female.   Stacy Blackwell presents with complaints of left breast pain which started yesterday. Causes pain into left axilla even. She feels like it may have been a bug bite, although no known specific bug bite. Denies any previous similar. Had a mammogram just last week. No previous breast issues. Has been taking tylenol and ibuprofen regularly recently which haven't. Also complaints of ear congestion which can cause headache sometimes. Has been using zyrtec and flonase as well as mucinex dm. She is not breastfeeding, no known history of MRSA or previous abscesses. History  Of acid reflux, asthma, depression obesity, vertigo.     ROS per HPI, negative if not otherwise mentioned.      Past Medical History:  Diagnosis Date  . Acid reflux   . Asthma   . Depression    doing good, no meds  . Obesity   . Ovarian cyst   . Vertigo     Patient Active Problem List   Diagnosis Date Noted  . NSVD (normal spontaneous vaginal delivery) 01/14/2015  . Premature rupture of membranes 01/13/2015  . Active labor at term 01/13/2015  . Polyhydramnios in third trimester   . [redacted] weeks gestation of pregnancy   . Polyhydramnios in third trimester, antepartum   . [redacted] weeks gestation of pregnancy   . Large for dates   . [redacted] weeks gestation of pregnancy   . Abdominal pain in pregnancy, antepartum   . [redacted] weeks gestation of pregnancy   . Evaluate anatomy not seen on prior sonogram   . Obesity in pregnancy with antepartum complication   . [redacted] weeks gestation of pregnancy   . Encounter for fetal anatomic survey   . [redacted] weeks gestation of pregnancy   . 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 Allergic rhinitis 07/01/2014  . GERD (gastroesophageal reflux disease) 07/01/2014  . Tobacco use disorder  07/01/2014    Past Surgical History:  Procedure Laterality Date  . CYST EXCISION Left 1987    OB History    Gravida  2   Para  2   Term  2   Preterm  0   AB  0   Living  2     SAB  0   TAB  0   Ectopic  0   Multiple  0   Live Births  2            Home Medications    Prior to Admission medications   Medication Sig Start Date End Date Taking? Authorizing Provider  cetirizine (ZYRTEC) 5 MG tablet Take 1 tablet (5 mg total) by mouth daily. 11/08/18  Yes Khatri, Hina, PA-C  dextromethorphan-guaiFENesin (MUCINEX DM) 30-600 MG 12hr tablet Take 1 tablet by mouth 2 (two) times daily.   Yes [provider]  famotidine (PEPCID) 10 MG tablet Take 10 mg by mouth 2 (two) times daily.   Yes [provider]  simethicone (MYLICON) 80 MG chewable tablet Chew 80 mg by mouth every 6 (six) hours as needed for flatulence.   Yes [provider]  acetaminophen (TYLENOL) 325 MG tablet Take 2 tablets (650 mg total) by mouth every 6 (six) hours as needed. 11/08/18   Khatri, Hina, PA-C  albuterol (PROVENTIL HFA;VENTOLIN HFA)  108 (90 Base) MCG/ACT inhaler Inhale 1-2 puffs into the lungs every 6 (six) hours as needed for wheezing or shortness of breath. 04/06/17   Ward, Chase PicketJaime Pilcher, PA-C  albuterol (PROVENTIL) (2.5 MG/3ML) 0.083% nebulizer solution Take 2.5 mg by nebulization every 6 (six) hours as needed for wheezing or shortness of breath.    [provider]  albuterol (VENTOLIN HFA) 108 (90 Base) MCG/ACT inhaler Inhale 1-2 puffs into the lungs every 6 (six) hours as needed for wheezing or shortness of breath. 07/22/19   Bethel BornGekas, Kelly Marie, PA-C  benzonatate (TESSALON) 100 MG capsule Take 2 capsules (200 mg total) by mouth every 8 (eight) hours. 11/08/18   Khatri, Hina, PA-C  chlorpheniramine-HYDROcodone (TUSSIONEX PENNKINETIC ER) 10-8 MG/5ML SUER Take 5 mLs by mouth every 12 (twelve) hours as needed for cough. 09/08/18   Belinda FisherYu, Amy V, PA-C  dextromethorphan 15  MG/5ML syrup Take 10 mLs (30 mg total) by mouth 4 (four) times daily as needed for cough. 07/22/19   Bethel BornGekas, Kelly Marie, PA-C  Dextromethorphan-Guaifenesin (ROBITUSSIN SUGAR FREE) 10-100 MG/5ML liquid Take 10 mLs by mouth every 6 (six) hours as needed (cold symptoms).    [provider]  doxycycline (VIBRAMYCIN) 100 MG capsule Take 1 capsule (100 mg total) by mouth 2 (two) times daily. 08/30/19   Georgetta HaberBurky, Natalie B, NP  fluticasone (FLONASE) 50 MCG/ACT nasal spray Place 1 spray into both nostrils daily. 11/08/18   Khatri, Hina, PA-C  ibuprofen (ADVIL) 800 MG tablet Take 1 tablet (800 mg total) by mouth 3 (three) times daily. 08/30/19   Linus MakoBurky, Natalie B, NP  ipratropium (ATROVENT) 0.06 % nasal spray Place 2 sprays into both nostrils 4 (four) times daily. 09/08/18   Cathie HoopsYu, Amy V, PA-C  medroxyPROGESTERone (DEPO-PROVERA) 150 MG/ML injection Inject 1 mL (150 mg total) into the muscle every 3 (three) months. 01/15/15   Montez MoritaLawson, Marie D, CNM  ondansetron (ZOFRAN ODT) 4 MG disintegrating tablet Take 1 tablet (4 mg total) by mouth every 8 (eight) hours as needed for nausea or vomiting. 11/08/18   Khatri, Hina, PA-C  predniSONE (DELTASONE) 20 MG tablet Take 2 tablets (40 mg total) by mouth daily for 4 days. 08/28/19 09/01/19  Curatolo, Adam, DO  triamcinolone (NASACORT) 55 MCG/ACT AERO nasal inhaler Place 2 sprays into the nose daily. 08/30/19   Georgetta HaberBurky, Natalie B, NP    Family History Family History  Problem Relation Age of Onset  . Asthma Mother   . Diabetes Mother   . Heart disease Mother        CHF  . Asthma Sister   . Diabetes Sister   . Heart disease Sister   . Hearing loss Neg Hx     Social History Social History   Tobacco Use  . Smoking status: Current Some Day Smoker    Packs/day: 0.25    Years: 10.00    Pack years: 2.50    Types: Cigarettes  . Smokeless tobacco: Never Used  Substance Use Topics  . Alcohol use: Yes  . Drug use: No     Allergies   Amoxicillin and Hydrocodone    Review of Systems Review of Systems   Physical Exam Triage Vital Signs ED Triage Vitals  Enc Vitals Group     BP 08/30/19 1458 111/61     Pulse Rate 08/30/19 1458 86     Resp 08/30/19 1458 20     Temp 08/30/19 1458 98.9 F (37.2 C)     Temp Source 08/30/19 1458 Oral  SpO2 08/30/19 1458 100 %     Weight --      Height --      Head Circumference --      Peak Flow --      Pain Score 08/30/19 1454 10     Pain Loc --      Pain Edu? --      Excl. in GC? --    No data found.  Updated Vital Signs BP 111/61 (BP Location: Left Arm)   Pulse 86   Temp 98.9 F (37.2 C) (Oral)   Resp 20   SpO2 100%    Physical Exam Constitutional:      General: She is not in acute distress.    Appearance: She is well-developed.  HENT:     Right Ear: Tympanic membrane and ear canal normal.     Left Ear: Tympanic membrane and ear canal normal.  Cardiovascular:     Rate and Rhythm: Normal rate.  Pulmonary:     Effort: Pulmonary effort is normal.  Chest:       Comments: Focal point tenderness at 2 oclock of left areola; no palpable abscess, no obvious abnormal skin, maybe slightly darker in appearance to affected skin; no significant lymphadenopathy palpable Skin:    General: Skin is warm and dry.  Neurological:     Mental Status: She is alert and oriented to person, place, and time.      UC Treatments / Results  Labs (all labs ordered are listed, but only abnormal results are displayed) Labs Reviewed - No data to display  EKG   Radiology No results found.  Procedures Procedures (including critical care time)  Medications Ordered in UC Medications - No data to display  Initial Impression / Assessment and Plan / UC Course  I have reviewed the triage vital signs and the nursing notes.  Pertinent labs & imaging results that were available during my care of the patient were reviewed by me and considered in my medical decision making (see chart for details).     Quite  significant point tenderness to left breast with maybe slight discoloration; no obvious insect bite; no obvious abscess. Will cover for cellulitis, and will order breast imaging to ensure not underlying concerns. Discussed options to help with ear congestion, will switch nasal sprays and try a plain mucinex. Return precautions provided. If symptoms worsen or do not improve in the next week to return to be seen or to follow up with PCP. Patient verbalized understanding and agreeable to plan.     Final Clinical Impressions(s) / UC Diagnoses   Final diagnoses:  Breast pain in female  Congestion of both ears     Discharge Instructions     Push fluids to ensure adequate hydration and keep secretions thin.  Tylenol and/or ibuprofen as needed for pain or fevers. I have sent a different dosage of ibuprofen to use, as directed.  Complete course of antibiotics.  Stop flonase and use nasacort daily. Use plain mucinex, no DM. Continue with daily allergy medications.  You will be called to set up appointment to further evaluate your breast pain.  Apply warm compresses to the affected area.  If symptoms worsen or do not improve in the next week to return to be seen or to follow up with your PCP.       ED Prescriptions    Medication Sig Dispense Auth. Provider   doxycycline (VIBRAMYCIN) 100 MG capsule Take 1 capsule (100 mg total) by mouth  2 (two) times daily. 20 capsule Linus Mako B, NP   ibuprofen (ADVIL) 800 MG tablet Take 1 tablet (800 mg total) by mouth 3 (three) times daily. 21 tablet Linus Mako B, NP   triamcinolone (NASACORT) 55 MCG/ACT AERO nasal inhaler Place 2 sprays into the nose daily. 1 Inhaler Georgetta Haber, NP     PDMP not reviewed this encounter.   Georgetta Haber, NP 08/30/19 1544

## 2019-08-30 NOTE — ED Triage Notes (Signed)
Patient seen in ED on Saturday 08/28/2019.    Since then has noticed an insect bite on left breast.  Patient has dizziness.  Patient reports she tested negative in ED when seen Saturday.  Patient complain of pressure in back of head

## 2019-08-31 ENCOUNTER — Other Ambulatory Visit: Payer: Self-pay | Admitting: Emergency Medicine

## 2019-08-31 DIAGNOSIS — N644 Mastodynia: Secondary | ICD-10-CM

## 2019-09-04 ENCOUNTER — Other Ambulatory Visit: Payer: Self-pay

## 2019-09-04 ENCOUNTER — Encounter (HOSPITAL_COMMUNITY): Payer: Self-pay | Admitting: Emergency Medicine

## 2019-09-04 ENCOUNTER — Emergency Department (HOSPITAL_COMMUNITY)
Admission: EM | Admit: 2019-09-04 | Discharge: 2019-09-04 | Disposition: A | Discharge status: Home / Self Care | Payer: Medicare Other | Attending: Emergency Medicine | Admitting: Emergency Medicine

## 2019-09-04 DIAGNOSIS — R221 Localized swelling, mass and lump, neck: Secondary | ICD-10-CM | POA: Diagnosis present

## 2019-09-04 DIAGNOSIS — F17210 Nicotine dependence, cigarettes, uncomplicated: Secondary | ICD-10-CM | POA: Diagnosis not present

## 2019-09-04 DIAGNOSIS — Z79899 Other long term (current) drug therapy: Secondary | ICD-10-CM | POA: Diagnosis not present

## 2019-09-04 DIAGNOSIS — R590 Localized enlarged lymph nodes: Secondary | ICD-10-CM

## 2019-09-04 DIAGNOSIS — J069 Acute upper respiratory infection, unspecified: Secondary | ICD-10-CM | POA: Insufficient documentation

## 2019-09-04 DIAGNOSIS — J45909 Unspecified asthma, uncomplicated: Secondary | ICD-10-CM | POA: Diagnosis not present

## 2019-09-04 NOTE — Discharge Instructions (Signed)
Recommend calling your primary doctor to schedule a recheck for Monday or Tuesday.  Recommend continue the antibiotics as previously prescribed by your primary doctor.  If you develop any worsening neck swelling, develop neck stiffness, any fever, difficulty swallowing, redness, or other new concerning symptom, please return to ER for reassessment.

## 2019-09-04 NOTE — ED Triage Notes (Signed)
C/o swelling to R side of neck x 2 days.  Denies sore throat.  Denies SOB.  Denies fever.  Reports "weird" taste in mouth this morning.  Negative COVID test 1 week ago.

## 2019-09-04 NOTE — ED Provider Notes (Addendum)
[facility] EMERGENCY DEPARTMENT Provider Note   CSN: [id number] Arrival date & time: 09/04/19  5456     History   Chief Complaint Chief Complaint  Patient presents with  . neck swelling    HPI Stacy Blackwell is a 38 y.o. female. Presents with neck swelling.  Patient reports over the past week or so she had intermittent dry nonproductive cough, malaise.  States that the symptoms have generally been improving.  She has not had any associated fever or difficulty in breathing.  States her primary doctor prescribed her doxycycline.  Reports she had a negative Covid test recently.  Last night and this morning she noted sensation of neck swelling on her right anterior neck.  No associated sore throat, no odynophagia, dysphagia, no neck stiffness.  No associated ear pain.  No fevers.  Denies prior history of thyroid problems.    HPI  Past Medical History:  Diagnosis Date  . Acid reflux   . Asthma   . Depression    doing good, no meds  . Obesity   . Ovarian cyst   . Vertigo     Patient Active Problem List   Diagnosis Date Noted  . NSVD (normal spontaneous vaginal delivery) 01/14/2015  . Premature rupture of membranes 01/13/2015  . Active labor at term 01/13/2015  . Polyhydramnios in third trimester   . [redacted] weeks gestation of pregnancy   . Polyhydramnios in third trimester, antepartum   . [redacted] weeks gestation of pregnancy   . Large for dates   . [redacted] weeks gestation of pregnancy   . Abdominal pain in pregnancy, antepartum   . [redacted] weeks gestation of pregnancy   . Evaluate anatomy not seen on prior sonogram   . Obesity in pregnancy with antepartum complication   . [redacted] weeks gestation of pregnancy   . Encounter for fetal anatomic survey   . [redacted] weeks gestation of pregnancy   . 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 Allergic rhinitis 07/01/2014  . GERD (gastroesophageal reflux disease) 07/01/2014  . Tobacco use disorder 07/01/2014    Past Surgical History:   Procedure Laterality Date  . CYST EXCISION Left 1987     OB History    Gravida  2   Para  2   Term  2   Preterm  0   AB  0   Living  2     SAB  0   TAB  0   Ectopic  0   Multiple  0   Live Births  2            Home Medications    Prior to Admission medications   Medication Sig Start Date End Date Taking? Authorizing Provider  acetaminophen (TYLENOL) 325 MG tablet Take 2 tablets (650 mg total) by mouth every 6 (six) hours as needed. 11/08/18   Khatri, Hina, PA-C  albuterol (PROVENTIL HFA;VENTOLIN HFA) 108 (90 Base) MCG/ACT inhaler Inhale 1-2 puffs into the lungs every 6 (six) hours as needed for wheezing or shortness of breath. 04/06/17   Ward, Chase Picket, PA-C  albuterol (PROVENTIL) (2.5 MG/3ML) 0.083% nebulizer solution Take 2.5 mg by nebulization every 6 (six) hours as needed for wheezing or shortness of breath.    [provider]  albuterol (VENTOLIN HFA) 108 (90 Base) MCG/ACT inhaler Inhale 1-2 puffs into the lungs every 6 (six) hours as needed for wheezing or shortness of breath. 07/22/19   Bethel Born, PA-C  benzonatate (TESSALON) 100 MG capsule  Take 2 capsules (200 mg total) by mouth every 8 (eight) hours. 11/08/18   Khatri, Hina, PA-C  cetirizine (ZYRTEC) 5 MG tablet Take 1 tablet (5 mg total) by mouth daily. 11/08/18   Khatri, Hina, PA-C  chlorpheniramine-HYDROcodone (TUSSIONEX PENNKINETIC ER) 10-8 MG/5ML SUER Take 5 mLs by mouth every 12 (twelve) hours as needed for cough. 09/08/18   Ok Edwards, PA-C  dextromethorphan 15 MG/5ML syrup Take 10 mLs (30 mg total) by mouth 4 (four) times daily as needed for cough. 07/22/19   Recardo Evangelist, PA-C  dextromethorphan-guaiFENesin (MUCINEX DM) 30-600 MG 12hr tablet Take 1 tablet by mouth 2 (two) times daily.    [provider]  Dextromethorphan-Guaifenesin (ROBITUSSIN SUGAR FREE) 10-100 MG/5ML liquid Take 10 mLs by mouth every 6 (six) hours as needed (cold symptoms).    [provider]   doxycycline (VIBRAMYCIN) 100 MG capsule Take 1 capsule (100 mg total) by mouth 2 (two) times daily. 08/30/19   Zigmund Gottron, NP  famotidine (PEPCID) 10 MG tablet Take 10 mg by mouth 2 (two) times daily.    [provider]  fluticasone (FLONASE) 50 MCG/ACT nasal spray Place 1 spray into both nostrils daily. 11/08/18   Khatri, Hina, PA-C  ibuprofen (ADVIL) 800 MG tablet Take 1 tablet (800 mg total) by mouth 3 (three) times daily. 08/30/19   Augusto Gamble B, NP  ipratropium (ATROVENT) 0.06 % nasal spray Place 2 sprays into both nostrils 4 (four) times daily. 09/08/18   Tasia Catchings, Amy V, PA-C  medroxyPROGESTERone (DEPO-PROVERA) 150 MG/ML injection Inject 1 mL (150 mg total) into the muscle every 3 (three) months. 01/15/15   Keitha Butte, CNM  ondansetron (ZOFRAN ODT) 4 MG disintegrating tablet Take 1 tablet (4 mg total) by mouth every 8 (eight) hours as needed for nausea or vomiting. 11/08/18   Khatri, Hina, PA-C  simethicone (MYLICON) 80 MG chewable tablet Chew 80 mg by mouth every 6 (six) hours as needed for flatulence.    [provider]  triamcinolone (NASACORT) 55 MCG/ACT AERO nasal inhaler Place 2 sprays into the nose daily. 08/30/19   Zigmund Gottron, NP    Family History Family History  Problem Relation Age of Onset  . Asthma Mother   . Diabetes Mother   . Heart disease Mother        CHF  . Asthma Sister   . Diabetes Sister   . Heart disease Sister   . Hearing loss Neg Hx     Social History Social History   Tobacco Use  . Smoking status: Current Some Day Smoker    Packs/day: 0.25    Years: 10.00    Pack years: 2.50    Types: Cigarettes  . Smokeless tobacco: Never Used  Substance Use Topics  . Alcohol use: Yes  . Drug use: No     Allergies   Amoxicillin and Hydrocodone   Review of Systems Review of Systems  Constitutional: Negative for chills and fever.  HENT: Negative for ear pain and sore throat.   Eyes: Negative for pain and visual disturbance.   Respiratory: Negative for cough and shortness of breath.   Cardiovascular: Negative for chest pain and palpitations.  Gastrointestinal: Negative for abdominal pain and vomiting.  Genitourinary: Negative for dysuria and hematuria.  Musculoskeletal: Positive for neck pain. Negative for arthralgias, back pain and neck stiffness.  Skin: Negative for color change and rash.  Neurological: Negative for seizures and syncope.  All other systems reviewed and are negative.  Physical Exam Updated Vital Signs BP 139/64 (BP Location: Left Arm)   Pulse 91   Temp 98.3 F (36.8 C) (Oral)   Resp 16   SpO2 100%   Physical Exam Vitals signs and nursing note reviewed.  Constitutional:      General: She is not in acute distress.    Appearance: She is well-developed.  HENT:     Head: Normocephalic and atraumatic.     Mouth/Throat:     Mouth: Mucous membranes are moist.     Pharynx: Oropharynx is clear. No oropharyngeal exudate or posterior oropharyngeal erythema.     Comments: No abscess or swelling noted in mouth Eyes:     Conjunctiva/sclera: Conjunctivae normal.  Neck:     Musculoskeletal: Neck supple.     Comments: Noted mild cervical lymphadenopathy on right side, no tenderness to palpation throughout neck, no neck stiffness, no induration or erythema noted, normal neck range of motion Cardiovascular:     Rate and Rhythm: Normal rate and regular rhythm.     Heart sounds: No murmur.  Pulmonary:     Effort: Pulmonary effort is normal. No respiratory distress.     Breath sounds: Normal breath sounds.  Abdominal:     Palpations: Abdomen is soft.     Tenderness: There is no abdominal tenderness.  Skin:    General: Skin is warm and dry.     Capillary Refill: Capillary refill takes less than 2 seconds.  Neurological:     General: No focal deficit present.     Mental Status: She is alert and oriented to person, place, and time.  Psychiatric:        Mood and Affect: Mood normal.         Behavior: Behavior normal.      ED Treatments / Results  Labs (all labs ordered are listed, but only abnormal results are displayed) Labs Reviewed - No data to display  EKG None  Radiology No results found.  Procedures Procedures (including critical care time)  Medications Ordered in ED Medications - No data to display   Initial Impression / Assessment and Plan / ED Course  I have reviewed the triage vital signs and the nursing notes.  Pertinent labs & imaging results that were available during my care of the patient were reviewed by me and considered in my medical decision making (see chart for details).        38 year old lady presents to ER with concern for right-sided neck swelling.  On exam patient is noted to be well-appearing with normal vital signs.  No associated fevers, afebrile here.  On physical exam noted mild cervical lymphadenopathy but no significant swelling, no mass, no induration was appreciated on exam.  She has normal neck range of motion.  No abscess identified on thorough inspection of oropharynx, no erythema or exudate noted in posterior oropharynx.  Suspect most likely etiology is cervical lymphadenopathy in the setting of recent viral URI.  Recommend that patient have a recheck with her primary doctor.  At this time given the overall benign exam, do not feel emergent imaging is needed.  However, discussed return precautions with patient should she develop any worsening symptoms such as increased neck swelling, neck stiffness, fevers, dysphagia or odynophagia.  Patient demonstrates good understanding of these precautions.  After the discussed management above, the patient was determined to be safe for discharge.  The patient was in agreement with this plan and all questions regarding their care were answered.  ED return  precautions were discussed and the patient will return to the ED with any significant worsening of condition.    Final Clinical  Impressions(s) / ED Diagnoses   Final diagnoses:  Viral URI  Cervical lymphadenopathy    ED Discharge Orders    None       Milagros Lollykstra, Laisa Larrick S, MD 09/04/19 [id number]    Milagros Lollykstra, Charley Lafrance S, MD 09/04/19 [phone]

## 2019-09-16 ENCOUNTER — Emergency Department (HOSPITAL_COMMUNITY)
Admission: EM | Admit: 2019-09-16 | Discharge: 2019-09-16 | Disposition: A | Discharge status: Home / Self Care | Payer: Medicare Other | Attending: Emergency Medicine | Admitting: Emergency Medicine

## 2019-09-16 ENCOUNTER — Other Ambulatory Visit: Payer: Self-pay

## 2019-09-16 ENCOUNTER — Emergency Department (HOSPITAL_COMMUNITY): Payer: Medicare Other

## 2019-09-16 ENCOUNTER — Encounter (HOSPITAL_COMMUNITY): Payer: Self-pay | Admitting: Emergency Medicine

## 2019-09-16 DIAGNOSIS — R0789 Other chest pain: Secondary | ICD-10-CM | POA: Insufficient documentation

## 2019-09-16 DIAGNOSIS — Z5321 Procedure and treatment not carried out due to patient leaving prior to being seen by health care provider: Secondary | ICD-10-CM | POA: Diagnosis not present

## 2019-09-16 LAB — I-STAT BETA HCG BLOOD, ED (MC, WL, AP ONLY): I-stat hCG, quantitative: <5 m[IU]/mL (ref <5)

## 2019-09-16 LAB — BASIC METABOLIC PANEL
Anion gap: 11 (ref 5–15)
BUN: <5 mg/dL — ABNORMAL LOW (ref 6–20)
CO2: 21 mmol/L — ABNORMAL LOW (ref 22–32)
Calcium: 9.3 mg/dL (ref 8.9–10.3)
Chloride: 106 mmol/L (ref 98–111)
Creatinine, Ser: 0.97 mg/dL (ref 0.44–1.00)
GFR calc Af Amer: >60 mL/min (ref >60)
GFR calc non Af Amer: >60 mL/min (ref >60)
Glucose, Bld: 96 mg/dL (ref 70–99)
Potassium: 3.9 mmol/L (ref 3.5–5.1)
Sodium: 138 mmol/L (ref 135–145)

## 2019-09-16 LAB — CBC
HCT: 44.5 % (ref 36.0–46.0)
Hemoglobin: 14.4 g/dL (ref 12.0–15.0)
MCH: 29 pg (ref 26.0–34.0)
MCHC: 32.4 g/dL (ref 30.0–36.0)
MCV: 89.5 fL (ref 80.0–100.0)
Platelets: 249 10*3/uL (ref 150–400)
RBC: 4.97 MIL/uL (ref 3.87–5.11)
RDW: 14.3 % (ref 11.5–15.5)
WBC: 7.6 10*3/uL (ref 4.0–10.5)
nRBC: 0 % (ref 0.0–0.2)

## 2019-09-16 LAB — TROPONIN I (HIGH SENSITIVITY): Troponin I (High Sensitivity): 5 ng/L (ref <18)

## 2019-09-16 MED ORDER — SODIUM CHLORIDE 0.9% FLUSH: 3.0000 mL | Freq: Once | INTRAVENOUS | Status: DC

## 2019-09-16 NOTE — ED Triage Notes (Signed)
Patient reports intermittent central chest pain/tightness radiating to mid back onset yesterday , denies SOB , no emesis or diaphoresis , denies chest pain at triage , patient added left lower leg swelling this evening .

## 2019-09-21 ENCOUNTER — Telehealth: Payer: Self-pay

## 2019-09-21 NOTE — Telephone Encounter (Signed)
Received call from patient checking Covid results.  Advised no results at this time.   

## 2019-11-29 ENCOUNTER — Ambulatory Visit: Payer: Medicare Other | Attending: Internal Medicine

## 2019-11-29 DIAGNOSIS — Z20822 Contact with and (suspected) exposure to covid-19: Secondary | ICD-10-CM

## 2019-11-30 ENCOUNTER — Telehealth: Payer: Self-pay | Admitting: *Deleted

## 2019-11-30 LAB — NOVEL CORONAVIRUS, NAA: SARS-CoV-2, NAA: NOT DETECTED

## 2019-11-30 NOTE — Telephone Encounter (Signed)
Patient called requesting Covid test results from yesterday.  Patient informed Covid results are still pending and may take up to 24 - 72 hours for final results to be available.  Patient verbalized understanding.

## 2020-01-24 ENCOUNTER — Ambulatory Visit: Payer: Medicare Other | Attending: Internal Medicine

## 2020-01-24 DIAGNOSIS — Z20822 Contact with and (suspected) exposure to covid-19: Secondary | ICD-10-CM

## 2020-01-25 ENCOUNTER — Telehealth: Payer: Self-pay

## 2020-01-25 NOTE — Telephone Encounter (Signed)
Pt called for covid results advised that results are not back 

## 2020-01-26 LAB — NOVEL CORONAVIRUS, NAA: SARS-CoV-2, NAA: NOT DETECTED

## 2020-01-26 LAB — SARS-COV-2, NAA 2 DAY TAT

## 2020-05-27 ENCOUNTER — Telehealth: Payer: Self-pay

## 2020-05-27 NOTE — Telephone Encounter (Signed)
Pt called for covid results. No pending test noted. Pt was tested at [facility]. Advised pt testing could be through [employer] [facility] rather than [facility]. Pt stated that [facility] is processing the test. Provided pt with web address and phone number to [facility].Pt verbalized understanding.

## 2020-10-25 ENCOUNTER — Other Ambulatory Visit (HOSPITAL_COMMUNITY): Payer: Self-pay | Admitting: Emergency Medicine

## 2020-10-25 DIAGNOSIS — N644 Mastodynia: Secondary | ICD-10-CM

## 2021-08-15 ENCOUNTER — Other Ambulatory Visit: Payer: Self-pay

## 2021-08-15 ENCOUNTER — Encounter (HOSPITAL_COMMUNITY): Payer: Self-pay | Admitting: *Deleted

## 2021-08-15 ENCOUNTER — Emergency Department (HOSPITAL_COMMUNITY): Payer: Medicare Other

## 2021-08-15 ENCOUNTER — Emergency Department (HOSPITAL_COMMUNITY)
Admission: EM | Admit: 2021-08-15 | Discharge: 2021-08-15 | Disposition: A | Discharge status: Home / Self Care | Payer: Medicare Other | Attending: Emergency Medicine | Admitting: Emergency Medicine

## 2021-08-15 DIAGNOSIS — R059 Cough, unspecified: Secondary | ICD-10-CM | POA: Diagnosis not present

## 2021-08-15 DIAGNOSIS — Z20822 Contact with and (suspected) exposure to covid-19: Secondary | ICD-10-CM | POA: Insufficient documentation

## 2021-08-15 DIAGNOSIS — R197 Diarrhea, unspecified: Secondary | ICD-10-CM | POA: Diagnosis not present

## 2021-08-15 DIAGNOSIS — G4700 Insomnia, unspecified: Secondary | ICD-10-CM | POA: Insufficient documentation

## 2021-08-15 DIAGNOSIS — R079 Chest pain, unspecified: Secondary | ICD-10-CM | POA: Diagnosis not present

## 2021-08-15 DIAGNOSIS — M791 Myalgia, unspecified site: Secondary | ICD-10-CM | POA: Diagnosis not present

## 2021-08-15 DIAGNOSIS — J45909 Unspecified asthma, uncomplicated: Secondary | ICD-10-CM | POA: Insufficient documentation

## 2021-08-15 DIAGNOSIS — R0981 Nasal congestion: Secondary | ICD-10-CM | POA: Diagnosis not present

## 2021-08-15 LAB — CBC
HCT: 45.2 % (ref 36.0–46.0)
Hemoglobin: 14.4 g/dL (ref 12.0–15.0)
MCH: 29.4 pg (ref 26.0–34.0)
MCHC: 31.9 g/dL (ref 30.0–36.0)
MCV: 92.2 fL (ref 80.0–100.0)
Platelets: 271 10*3/uL (ref 150–400)
RBC: 4.9 MIL/uL (ref 3.87–5.11)
RDW: 13.4 % (ref 11.5–15.5)
WBC: 5.5 10*3/uL (ref 4.0–10.5)
nRBC: 0 % (ref 0.0–0.2)

## 2021-08-15 LAB — BASIC METABOLIC PANEL
Anion gap: 11 (ref 5–15)
BUN: <5 mg/dL — ABNORMAL LOW (ref 6–20)
CO2: 22 mmol/L (ref 22–32)
Calcium: 9.1 mg/dL (ref 8.9–10.3)
Chloride: 105 mmol/L (ref 98–111)
Creatinine, Ser: 0.8 mg/dL (ref 0.44–1.00)
GFR, Estimated: >60 mL/min (ref >60)
Glucose, Bld: 91 mg/dL (ref 70–99)
Potassium: 3.5 mmol/L (ref 3.5–5.1)
Sodium: 138 mmol/L (ref 135–145)

## 2021-08-15 LAB — TROPONIN I (HIGH SENSITIVITY)
Troponin I (High Sensitivity): 3 ng/L (ref <18)
Troponin I (High Sensitivity): 3 ng/L (ref <18)

## 2021-08-15 LAB — I-STAT BETA HCG BLOOD, ED (MC, WL, AP ONLY): I-stat hCG, quantitative: <5 m[IU]/mL (ref <5)

## 2021-08-15 LAB — RESP PANEL BY RT-PCR (FLU A&B, COVID) ARPGX2
Influenza A by PCR: NEGATIVE
Influenza B by PCR: NEGATIVE
SARS Coronavirus 2 by RT PCR: NEGATIVE

## 2021-08-15 NOTE — ED Provider Notes (Signed)
Emergency Medicine Provider Triage Evaluation Note  Stacy Blackwell , a 40 y.o. female  was evaluated in triage.  Pt complains of cough and congestion x2 weeks.  States that today she had sudden onset of central chest pain, that feels like pressure.  She also notes diarrhea, body aches, and inability to sleep.  She is having multiple episodes of diarrhea per day.  3 of asthma has been taking her asthma medications without relief.  Review of Systems  Positive: Active cough, nasal congestion, sore throat, chest pain, shortness of breath, diarrhea Negative: Abdominal pain, nausea, vomiting, fever  Physical Exam  BP (!) 135/108 (BP Location: Left Arm)   Pulse (!) 108   Temp 97.9 F (36.6 C) (Oral)   Resp 20   SpO2 98%  Gen:   Awake, no distress   Resp:  Normal effort  MSK:   Moves extremities without difficulty  Other:  Lungs clear to auscultation, heart regular rate and rhythm  Medical Decision Making  Medically screening exam initiated at 1:04 PM.  Appropriate orders placed.  Stacy Blackwell was informed that the remainder of the evaluation will be completed by another provider, this initial triage assessment does not replace that evaluation, and the importance of remaining in the ED until their evaluation is complete.     Jeanella Flattery 08/15/21 1305    Margarita Grizzle, MD 08/15/21 [phone]

## 2021-08-15 NOTE — ED Notes (Signed)
Patient states she has an appointment on Friday and will follow up there.

## 2021-08-15 NOTE — ED Triage Notes (Signed)
Pt reports having a cough and congestion x 2 weeks, now has chest pain today. Reports diarrhea, bodyaches, unable to sleep. Airway intact at triage.

## 2021-08-16 ENCOUNTER — Telehealth (HOSPITAL_COMMUNITY): Payer: Self-pay

## 2021-08-24 ENCOUNTER — Other Ambulatory Visit: Payer: Self-pay | Admitting: Internal Medicine

## 2021-08-25 LAB — COMPLETE METABOLIC PANEL WITH GFR
AG Ratio: 1.4 (calc) (ref 1.0–2.5)
ALT: 17 U/L (ref 6–29)
AST: 14 U/L (ref 10–30)
Albumin: 4 g/dL (ref 3.6–5.1)
Alkaline phosphatase (APISO): 59 U/L (ref 31–125)
BUN: 9 mg/dL (ref 7–25)
CO2: 24 mmol/L (ref 20–32)
Calcium: 8.8 mg/dL (ref 8.6–10.2)
Chloride: 102 mmol/L (ref 98–110)
Creat: 0.89 mg/dL (ref 0.50–0.99)
Globulin: 2.9 g/dL (calc) (ref 1.9–3.7)
Glucose, Bld: 68 mg/dL (ref 65–99)
Potassium: 4.1 mmol/L (ref 3.5–5.3)
Sodium: 136 mmol/L (ref 135–146)
Total Bilirubin: 0.8 mg/dL (ref 0.2–1.2)
Total Protein: 6.9 g/dL (ref 6.1–8.1)
eGFR: 84 mL/min/{1.73_m2} (ref >=60)

## 2021-08-25 LAB — CBC
HCT: 43.3 % (ref 35.0–45.0)
Hemoglobin: 14.3 g/dL (ref 11.7–15.5)
MCH: 29.9 pg (ref 27.0–33.0)
MCHC: 33 g/dL (ref 32.0–36.0)
MCV: 90.6 fL (ref 80.0–100.0)
MPV: 9.7 fL (ref 7.5–12.5)
Platelets: 297 10*3/uL (ref 140–400)
RBC: 4.78 10*6/uL (ref 3.80–5.10)
RDW: 12.6 % (ref 11.0–15.0)
WBC: 10.1 10*3/uL (ref 3.8–10.8)

## 2021-08-25 LAB — VITAMIN D 25 HYDROXY (VIT D DEFICIENCY, FRACTURES): Vit D, 25-Hydroxy: 13 ng/mL — ABNORMAL LOW (ref 30–100)

## 2021-08-25 LAB — LIPID PANEL
Cholesterol: 149 mg/dL (ref <200)
HDL: 52 mg/dL (ref >=50)
LDL Cholesterol (Calc): 78 mg/dL (calc)
Non-HDL Cholesterol (Calc): 97 mg/dL (calc) (ref <130)
Total CHOL/HDL Ratio: 2.9 (calc) (ref <5.0)
Triglycerides: 102 mg/dL (ref <150)

## 2021-08-25 LAB — TSH: TSH: 3.14 mIU/L

## 2022-11-20 ENCOUNTER — Other Ambulatory Visit: Payer: Self-pay | Admitting: Internal Medicine

## 2022-11-21 LAB — COMPLETE METABOLIC PANEL WITH GFR
AG Ratio: 1.1 (calc) (ref 1.0–2.5)
ALT: 14 U/L (ref 6–29)
AST: 15 U/L (ref 10–30)
Albumin: 3.9 g/dL (ref 3.6–5.1)
Alkaline phosphatase (APISO): 71 U/L (ref 31–125)
BUN/Creatinine Ratio: 7 (calc) (ref 6–22)
BUN: 5 mg/dL — ABNORMAL LOW (ref 7–25)
CO2: 23 mmol/L (ref 20–32)
Calcium: 8.7 mg/dL (ref 8.6–10.2)
Chloride: 105 mmol/L (ref 98–110)
Creat: 0.75 mg/dL (ref 0.50–0.99)
Globulin: 3.7 g/dL (calc) (ref 1.9–3.7)
Glucose, Bld: 64 mg/dL — ABNORMAL LOW (ref 65–99)
Potassium: 4 mmol/L (ref 3.5–5.3)
Sodium: 137 mmol/L (ref 135–146)
Total Bilirubin: 0.4 mg/dL (ref 0.2–1.2)
Total Protein: 7.6 g/dL (ref 6.1–8.1)
eGFR: 103 mL/min/{1.73_m2} (ref >=60)

## 2022-11-21 LAB — CBC
HCT: 41.9 % (ref 35.0–45.0)
Hemoglobin: 14.3 g/dL (ref 11.7–15.5)
MCH: 29.8 pg (ref 27.0–33.0)
MCHC: 34.1 g/dL (ref 32.0–36.0)
MCV: 87.3 fL (ref 80.0–100.0)
MPV: 9.5 fL (ref 7.5–12.5)
Platelets: 298 10*3/uL (ref 140–400)
RBC: 4.8 10*6/uL (ref 3.80–5.10)
RDW: 12.5 % (ref 11.0–15.0)
WBC: 5.8 10*3/uL (ref 3.8–10.8)

## 2022-11-21 LAB — LIPID PANEL
Cholesterol: 156 mg/dL (ref <200)
HDL: 40 mg/dL — ABNORMAL LOW (ref >=50)
LDL Cholesterol (Calc): 99 mg/dL (calc)
Non-HDL Cholesterol (Calc): 116 mg/dL (calc) (ref <130)
Total CHOL/HDL Ratio: 3.9 (calc) (ref <5.0)
Triglycerides: 77 mg/dL (ref <150)

## 2022-11-21 LAB — TSH: TSH: 1.95 mIU/L

## 2022-11-21 LAB — VITAMIN D 25 HYDROXY (VIT D DEFICIENCY, FRACTURES): Vit D, 25-Hydroxy: <4 ng/mL — ABNORMAL LOW (ref 30–100)

## 2023-06-12 ENCOUNTER — Other Ambulatory Visit: Payer: Self-pay | Admitting: Internal Medicine

## 2023-06-12 DIAGNOSIS — Z1231 Encounter for screening mammogram for malignant neoplasm of breast: Secondary | ICD-10-CM

## 2023-07-09 ENCOUNTER — Ambulatory Visit: Payer: Medicare Other

## 2023-07-17 ENCOUNTER — Encounter (HOSPITAL_COMMUNITY): Payer: Self-pay

## 2023-07-17 ENCOUNTER — Other Ambulatory Visit: Payer: Self-pay

## 2023-07-17 ENCOUNTER — Emergency Department (HOSPITAL_COMMUNITY): Payer: 59

## 2023-07-17 ENCOUNTER — Emergency Department (HOSPITAL_COMMUNITY)
Admission: EM | Admit: 2023-07-17 | Discharge: 2023-07-17 | Disposition: A | Discharge status: Home / Self Care | Payer: 59 | Attending: Emergency Medicine | Admitting: Emergency Medicine

## 2023-07-17 DIAGNOSIS — Z7951 Long term (current) use of inhaled steroids: Secondary | ICD-10-CM | POA: Insufficient documentation

## 2023-07-17 DIAGNOSIS — M79605 Pain in left leg: Secondary | ICD-10-CM | POA: Diagnosis not present

## 2023-07-17 DIAGNOSIS — M25552 Pain in left hip: Secondary | ICD-10-CM | POA: Insufficient documentation

## 2023-07-17 DIAGNOSIS — J45909 Unspecified asthma, uncomplicated: Secondary | ICD-10-CM | POA: Insufficient documentation

## 2023-07-17 DIAGNOSIS — M25562 Pain in left knee: Secondary | ICD-10-CM | POA: Insufficient documentation

## 2023-07-17 MED ORDER — KETOROLAC TROMETHAMINE 15 MG/ML IJ SOLN: 15.0000 mg | Freq: Once | INTRAMUSCULAR | Status: DC

## 2023-07-17 MED ORDER — OXYCODONE-ACETAMINOPHEN 5-325 MG PO TABS
1.0000 | ORAL_TABLET | Freq: Once | ORAL | Status: AC
  Administered 2023-07-17: 1 via ORAL
  Filled 2023-07-17: qty 1

## 2023-07-17 MED ORDER — NAPROXEN 500 MG PO TABS: 500.0000 mg | ORAL_TABLET | Freq: Two times a day (BID) | ORAL | 0 refills | Status: AC

## 2023-07-17 MED ORDER — PREDNISONE 20 MG PO TABS
60.0000 mg | ORAL_TABLET | Freq: Once | ORAL | Status: AC
  Administered 2023-07-17: 60 mg via ORAL
  Filled 2023-07-17: qty 3

## 2023-07-17 MED ORDER — PREDNISONE 10 MG PO TABS: 20.0000 mg | ORAL_TABLET | Freq: Every day | ORAL | 0 refills | Status: DC

## 2023-07-17 MED ORDER — KETOROLAC TROMETHAMINE 15 MG/ML IJ SOLN
15.0000 mg | Freq: Once | INTRAMUSCULAR | Status: AC
  Administered 2023-07-17: 15 mg via INTRAMUSCULAR
  Filled 2023-07-17: qty 1

## 2023-07-17 MED ORDER — METHOCARBAMOL 500 MG PO TABS
500.0000 mg | ORAL_TABLET | Freq: Once | ORAL | Status: AC
  Administered 2023-07-17: 500 mg via ORAL
  Filled 2023-07-17: qty 1

## 2023-07-17 MED ORDER — LIDOCAINE 5 % EX PTCH
1.0000 | MEDICATED_PATCH | Freq: Once | CUTANEOUS | Status: DC
  Administered 2023-07-17: 1 via TRANSDERMAL
  Filled 2023-07-17: qty 1

## 2023-07-17 MED ORDER — METHOCARBAMOL 500 MG PO TABS: 500.0000 mg | ORAL_TABLET | Freq: Two times a day (BID) | ORAL | 0 refills | Status: AC

## 2023-07-17 NOTE — ED Notes (Signed)
Patient transported to X-ray 

## 2023-07-17 NOTE — ED Provider Notes (Signed)
[facility] EMERGENCY DEPARTMENT AT [facility] Provider Note   CSN: [id number] Arrival date & time: 07/17/23  4098     History  Chief Complaint  Patient presents with   Knee Pain   Hip Pain    Stacy Blackwell is a 42 y.o. female.  Stacy Blackwell is a 42 yo female with past medical history of asthma, anxiety, and GERD who presents today with left knee and hip pain. Patient states she was sitting on her couch on Monday with her leg bent under her right leg and the next day she reports having left leg pain.Pain starts in the left lateral hip and radiates to the knee. Patient states the pain is achy and 9/10. Patient denies any numbness or tingling, weakness, nausea/vomiting, and trauma. Patient states ibuprofen, heating pad, and tiger balm with no alleviation in pain. Denies associated back pain. She avoids putting pressure/weight on left leg and cannot walk because of the pain. Patient states poor sleep and cannot lie on her left side due to pain.   The history is provided by the patient and a significant other.  Knee Pain Associated symptoms: no fever   Hip Pain       Home Medications Prior to Admission medications   Medication Sig Start Date End Date Taking? Authorizing Provider  methocarbamol (ROBAXIN) 500 MG tablet Take 1 tablet (500 mg total) by mouth 2 (two) times daily. 07/17/23  Yes Dartha Lodge, PA-C  naproxen (NAPROSYN) 500 MG tablet Take 1 tablet (500 mg total) by mouth 2 (two) times daily. 07/17/23  Yes Dartha Lodge, PA-C  predniSONE (DELTASONE) 10 MG tablet Take 2 tablets (20 mg total) by mouth daily. 07/17/23  Yes Dartha Lodge, PA-C  acetaminophen (TYLENOL) 325 MG tablet Take 2 tablets (650 mg total) by mouth every 6 (six) hours as needed. 11/08/18   Khatri, Hina, PA-C  albuterol (PROVENTIL HFA;VENTOLIN HFA) 108 (90 Base) MCG/ACT inhaler Inhale 1-2 puffs into the lungs every 6 (six) hours as needed for wheezing or shortness of breath. 04/06/17    Ward, Chase Picket, PA-C  albuterol (PROVENTIL) (2.5 MG/3ML) 0.083% nebulizer solution Take 2.5 mg by nebulization every 6 (six) hours as needed for wheezing or shortness of breath.    [provider]  albuterol (VENTOLIN HFA) 108 (90 Base) MCG/ACT inhaler Inhale 1-2 puffs into the lungs every 6 (six) hours as needed for wheezing or shortness of breath. 07/22/19   Bethel Born, PA-C  benzonatate (TESSALON) 100 MG capsule Take 2 capsules (200 mg total) by mouth every 8 (eight) hours. 11/08/18   Khatri, Hina, PA-C  cetirizine (ZYRTEC) 5 MG tablet Take 1 tablet (5 mg total) by mouth daily. 11/08/18   Khatri, Hina, PA-C  chlorpheniramine-HYDROcodone (TUSSIONEX PENNKINETIC ER) 10-8 MG/5ML SUER Take 5 mLs by mouth every 12 (twelve) hours as needed for cough. 09/08/18   Belinda Fisher, PA-C  dextromethorphan 15 MG/5ML syrup Take 10 mLs (30 mg total) by mouth 4 (four) times daily as needed for cough. 07/22/19   Bethel Born, PA-C  dextromethorphan-guaiFENesin (MUCINEX DM) 30-600 MG 12hr tablet Take 1 tablet by mouth 2 (two) times daily.    [provider]  Dextromethorphan-Guaifenesin (ROBITUSSIN SUGAR FREE) 10-100 MG/5ML liquid Take 10 mLs by mouth every 6 (six) hours as needed (cold symptoms).    [provider]  doxycycline (VIBRAMYCIN) 100 MG capsule Take 1 capsule (100 mg total) by mouth 2 (two) times daily. 08/30/19   Linus Mako  B, NP  famotidine (PEPCID) 10 MG tablet Take 10 mg by mouth 2 (two) times daily.    [provider]  fluticasone (FLONASE) 50 MCG/ACT nasal spray Place 1 spray into both nostrils daily. 11/08/18   Khatri, Hina, PA-C  ipratropium (ATROVENT) 0.06 % nasal spray Place 2 sprays into both nostrils 4 (four) times daily. 09/08/18   Cathie Hoops, Amy V, PA-C  medroxyPROGESTERone (DEPO-PROVERA) 150 MG/ML injection Inject 1 mL (150 mg total) into the muscle every 3 (three) months. 01/15/15   Montez Morita, CNM  ondansetron (ZOFRAN ODT) 4 MG disintegrating  tablet Take 1 tablet (4 mg total) by mouth every 8 (eight) hours as needed for nausea or vomiting. 11/08/18   Khatri, Hina, PA-C  simethicone (MYLICON) 80 MG chewable tablet Chew 80 mg by mouth every 6 (six) hours as needed for flatulence.    [provider]  triamcinolone (NASACORT) 55 MCG/ACT AERO nasal inhaler Place 2 sprays into the nose daily. 08/30/19   Georgetta Haber, NP      Allergies    Amoxicillin and Hydrocodone    Review of Systems   Review of Systems  Constitutional:  Negative for chills and fever.  Musculoskeletal:  Positive for arthralgias and myalgias.  Neurological:  Negative for weakness and numbness.    Physical Exam Updated Vital Signs BP (!) 143/76   Pulse 93   Temp 97.7 F (36.5 C) (Oral)   Resp 18   Ht 5\' 2"  (1.575 m)   Wt 132.9 kg   SpO2 100%   BMI 53.59 kg/m  Physical Exam Vitals and nursing note reviewed.  Constitutional:      General: She is not in acute distress.    Appearance: She is well-developed. She is not diaphoretic.  HENT:     Head: Atraumatic.  Eyes:     General:        Right eye: No discharge.        Left eye: No discharge.  Cardiovascular:     Pulses:          Radial pulses are 2+ on the right side and 2+ on the left side.       Dorsalis pedis pulses are 2+ on the right side and 2+ on the left side.       Posterior tibial pulses are 2+ on the right side and 2+ on the left side.  Pulmonary:     Effort: Pulmonary effort is normal. No respiratory distress.  Abdominal:     General: Bowel sounds are normal. There is no distension.     Palpations: Abdomen is soft. There is no mass.     Tenderness: There is no abdominal tenderness. There is no guarding.     Comments: Abdomen soft, nondistended, nontender to palpation in all quadrants without guarding or peritoneal signs, no CVA tenderness bilaterally  Musculoskeletal:     Cervical back: Neck supple.     Comments: Tenderness to palpation over left lateral hip and some mild  tenderness in left buttock, No focal bony tenderness or deformity.  Pain made worse with range of motion, but negative straight leg raise.  Skin:    General: Skin is warm and dry.     Capillary Refill: Capillary refill takes less than 2 seconds.  Neurological:     Mental Status: She is alert and oriented to person, place, and time.     Comments: Alert, clear speech, following commands. Moving all extremities without difficulty. Bilateral lower extremities with 5/5  strength in proximal and distal muscle groups and with dorsi and plantar flexion. Sensation intact in bilateral lower extremities. 2+ patellar DTRs bilaterally.  Psychiatric:        Mood and Affect: Mood and affect normal.        Behavior: Behavior normal.     ED Results / Procedures / Treatments   Labs (all labs ordered are listed, but only abnormal results are displayed) Labs Reviewed - No data to display   EKG None  Radiology DG Hip Unilat W or Wo Pelvis 2-3 Views Left  Result Date: 07/17/2023 CLINICAL DATA:  Left knee and lateral hip pain after sitting with right leg crossed over left EXAM: DG HIP (WITH OR WITHOUT PELVIS) 3V LEFT COMPARISON:  None Available. FINDINGS: There is no evidence of hip fracture or dislocation. Degenerative changes of the bilateral hips. IMPRESSION: 1. No acute fracture or dislocation. 2. Degenerative changes of the bilateral hips. Electronically Signed   By: Agustin Cree M.D.   On: 07/17/2023 10:38   DG Knee Complete 4 Views Left  Result Date: 07/17/2023 CLINICAL DATA:  Left knee and left lateral hip pain after sitting with right leg crossed over left EXAM: LEFT KNEE - COMPLETE 4 VIEW COMPARISON:  None Available. FINDINGS: There are no findings of fracture or dislocation. No joint effusion. Mild tricompartmental degenerative changes of the knee. Soft tissues are unremarkable. IMPRESSION: Mild tricompartmental degenerative changes of the knee. Electronically Signed   By: Agustin Cree M.D.   On:  07/17/2023 10:37    Procedures Procedures    Medications Ordered in ED Medications  lidocaine (LIDODERM) 5 % 1 patch (1 patch Transdermal Patch Applied 07/17/23 0951)  predniSONE (DELTASONE) tablet 60 mg (60 mg Oral Given 07/17/23 0951)  oxyCODONE-acetaminophen (PERCOCET/ROXICET) 5-325 MG per tablet 1 tablet (1 tablet Oral Given 07/17/23 0951)  methocarbamol (ROBAXIN) tablet 500 mg (500 mg Oral Given 07/17/23 1005)  ketorolac (TORADOL) 15 MG/ML injection 15 mg (15 mg Intramuscular Given 07/17/23 1610)    ED Course/ Medical Decision Making/ A&P                                 Medical Decision Making Amount and/or Complexity of Data Reviewed Radiology: ordered.  Risk Prescription drug management.   Pt presents with hip pain radiating to the knee without any associated trauma, no back pain or neurologic deficits noted. Concern for possible bursitis versus sciatic. 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 Pain treated in ED with significant improvement and pt able to move more comfortably. Will discharge home with supportive care and crutches, sports medicine follow up if not improving.  At this time there does not appear to be any evidence of an acute emergency medical condition requiring further emergent evaluation and the patient appears stable for discharge with appropriate outpatient follow up. Diagnosis and return precautions discussed with patient who verbalizes understanding and is agreeable to discharge.           Final Clinical Impression(s) / ED Diagnoses Final diagnoses:  Left hip pain    Rx / DC Orders ED Discharge Orders          Ordered    naproxen (NAPROSYN) 500 MG tablet  2 times daily        07/17/23 1105    predniSONE (DELTASONE) 10 MG tablet  Daily        07/17/23  1105    methocarbamol (ROBAXIN) 500 MG tablet  2 times daily        07/17/23 1105              Dartha Lodge, [place] 07/18/23 1845     Melene Plan, DO 07/19/23 0725

## 2023-07-17 NOTE — ED Triage Notes (Signed)
Pt states she sat in her chair at home on Monday and had her left leg was bent under her right leg and the next day she was having a lot of pain. Pt denies numbness and tingling. Pt states was barely able to dress herself or get in vehicle today.

## 2023-07-17 NOTE — Discharge Instructions (Signed)
Your x-ray shows some arthritis but no other bony changes.  Suspect your symptoms could be related to inflammation of the bursa sac around your hip or from sciatic inflammation.  To treat symptoms take Naprosyn as pain and anti-inflammatory medication twice daily, take prescribed steroid for the next 5 days to help decrease inflammation.  You can use prescribed muscle relaxer as needed for spasm, this medication can cause some drowsiness, do not take before driving, you can also use over-the-counter Salonpas lidocaine patches, ice and heat to help with pain.  Follow-up with orthopedics or sports medicine if symptoms or not resolving.  You can use crutches for the next few days to get the pressure off of your hip, work through the hip exercises provided on your paperwork today.

## 2023-07-17 NOTE — Progress Notes (Signed)
Orthopedic Tech Progress Note Patient Details:  Stacy Blackwell [birth year] [id number]  Crutches delivered to pt room in the ED. Crutch training was completed and pt ambulated successfully.   Ortho Devices Type of Ortho Device: Crutches Ortho Device/Splint Location: adjusted and at bedside Ortho Device/Splint Interventions: Ordered, Application, Adjustment   Post Interventions Patient Tolerated: Ambulated well, Well Instructions Provided: Poper ambulation with device, Care of device, Adjustment of device  Arisa Congleton Carmine Savoy 07/17/2023, 11:49 AM

## 2023-07-24 ENCOUNTER — Ambulatory Visit
Admission: RE | Admit: 2023-07-24 | Discharge: 2023-07-24 | Disposition: A | Discharge status: Home / Self Care | Payer: 59 | Source: Ambulatory Visit | Attending: Internal Medicine | Admitting: Internal Medicine

## 2023-07-24 DIAGNOSIS — Z1231 Encounter for screening mammogram for malignant neoplasm of breast: Secondary | ICD-10-CM

## 2023-12-25 ENCOUNTER — Ambulatory Visit (HOSPITAL_COMMUNITY)
Admission: EM | Admit: 2023-12-25 | Discharge: 2023-12-25 | Disposition: A | Discharge status: Home / Self Care | Attending: Emergency Medicine | Admitting: Emergency Medicine

## 2023-12-25 ENCOUNTER — Encounter (HOSPITAL_COMMUNITY): Payer: Self-pay | Admitting: Emergency Medicine

## 2023-12-25 ENCOUNTER — Other Ambulatory Visit: Payer: Self-pay

## 2023-12-25 DIAGNOSIS — J4521 Mild intermittent asthma with (acute) exacerbation: Secondary | ICD-10-CM | POA: Diagnosis not present

## 2023-12-25 DIAGNOSIS — J101 Influenza due to other identified influenza virus with other respiratory manifestations: Secondary | ICD-10-CM | POA: Diagnosis not present

## 2023-12-25 LAB — POC COVID19/FLU A&B COMBO
Covid Antigen, POC: NEGATIVE
Influenza A Antigen, POC: POSITIVE — AB
Influenza B Antigen, POC: NEGATIVE

## 2023-12-25 MED ORDER — PREDNISONE 20 MG PO TABS: 40.0000 mg | ORAL_TABLET | Freq: Every day | ORAL | 0 refills | Status: AC

## 2023-12-25 MED ORDER — OSELTAMIVIR PHOSPHATE 75 MG PO CAPS: 75.0000 mg | ORAL_CAPSULE | Freq: Two times a day (BID) | ORAL | 0 refills | Status: AC

## 2023-12-25 MED ORDER — IPRATROPIUM-ALBUTEROL 0.5-2.5 (3) MG/3ML IN SOLN
3.0000 mL | Freq: Once | RESPIRATORY_TRACT | Status: AC
  Administered 2023-12-25: 3 mL via RESPIRATORY_TRACT

## 2023-12-25 MED ORDER — IPRATROPIUM-ALBUTEROL 0.5-2.5 (3) MG/3ML IN SOLN
RESPIRATORY_TRACT | Status: AC
  Filled 2023-12-25: qty 3

## 2023-12-25 NOTE — ED Provider Notes (Signed)
 [facility]    CSN: [id number] Arrival date & time: 12/25/23  0840      History   Chief Complaint Chief Complaint  Patient presents with   Cough    HPI Stacy Blackwell is a 43 y.o. female.   Patient presents with cough, chills, sweats, body aches, chest congestion, shortness of breath, and wheezing that began the evening of 3/4.  Denies known fever, chest pain, abdominal pain, nausea, vomiting, and diarrhea.  Patient reports recent exposure to COVID and flu A.  Patient reports taking home tests yesterday that were both negative.  Patient states she has been taking ibuprofen and using albuterol nebulizer with mild relief.  History of asthma.   Cough   Past Medical History:  Diagnosis Date   Acid reflux    Asthma    Depression    doing good, no meds   Obesity    Ovarian cyst    Vertigo     Patient Active Problem List   Diagnosis Date Noted   NSVD (normal spontaneous vaginal delivery) 01/14/2015   Premature rupture of membranes 01/13/2015   Active labor at term 01/13/2015   Polyhydramnios in third trimester    [redacted] weeks gestation of pregnancy    Polyhydramnios in third trimester    [redacted] weeks gestation of pregnancy    Large for dates    [redacted] weeks gestation of pregnancy    Abdominal pain in pregnancy, antepartum    [redacted] weeks gestation of pregnancy    Evaluate anatomy not seen on prior sonogram    Obesity in pregnancy with antepartum complication    [redacted] weeks gestation of pregnancy    Encounter for fetal anatomic survey    [redacted] weeks gestation of pregnancy    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 Authorizing Provider  cetirizine (ZYRTEC) 5 MG tablet Take 1 tablet (5 mg total) by mouth daily. 11/08/18  Yes Khatri, Hina, PA-C  oseltamivir (TAMIFLU) 75 MG capsule Take 1 capsule (75 mg total) by mouth every 12 (twelve) hours. 12/25/23  Yes Susann Givens, Tippi Mccrae A, NP  predniSONE (DELTASONE) 20 MG tablet Take 2 tablets (40 mg total) by mouth daily for 5 days. 12/25/23 12/30/23 Yes Wynonia Lawman A, NP  acetaminophen (TYLENOL) 325 MG tablet Take 2 tablets (650 mg total) by mouth every 6 (six) hours as needed. 11/08/18   Khatri, Hina, PA-C  albuterol (PROVENTIL HFA;VENTOLIN HFA) 108 (90 Base) MCG/ACT inhaler Inhale 1-2 puffs into the lungs every 6 (six) hours as needed for wheezing or shortness of breath. 04/06/17   Ward, Chase Picket, PA-C  albuterol (PROVENTIL) (2.5 MG/3ML) 0.083% nebulizer solution Take 2.5 mg by nebulization every 6 (six) hours as needed for wheezing or shortness of breath.    [provider]  albuterol (VENTOLIN HFA) 108 (90 Base) MCG/ACT inhaler Inhale 1-2 puffs into the lungs every 6 (six) hours as needed for wheezing or shortness of breath. Patient taking differently: Inhale 1-2 puffs into the lungs every 6 (six) hours as needed for wheezing or shortness of breath. WAITING FOR PHARMACY TO SEND TO PATIENT TODAY 12/25/2023 07/22/19   Bethel Born, PA-C  fluticasone [facility]) 50 MCG/ACT nasal spray Place 1 spray into both nostrils daily. 11/08/18   Khatri, Hina, PA-C  medroxyPROGESTERone (DEPO-PROVERA) 150 MG/ML injection Inject 1 mL (150 mg total) into the muscle every 3 (three) months. 01/15/15   Montez Morita, CNM  methocarbamol (ROBAXIN) 500 MG tablet Take 1 tablet (500 mg total) by mouth 2 (two) times daily. 07/17/23   Rosezella Rumpf, PA-C  naproxen (NAPROSYN) 500 MG tablet Take 1 tablet (500 mg total) by mouth 2 (two) times daily. 07/17/23   Rosezella Rumpf, PA-C  simethicone (MYLICON) 80 MG chewable tablet Chew 80 mg by mouth every 6 (six) hours as needed for flatulence.    [provider]  triamcinolone (NASACORT) 55 MCG/ACT AERO nasal inhaler Place 2 sprays into the nose daily. 08/30/19   Georgetta Haber, NP    Family History Family History  Problem Relation Age of Onset   Asthma Mother    Diabetes Mother    Heart disease Mother        CHF   Asthma Sister    Diabetes Sister    Heart disease Sister    Hearing loss Neg Hx     Social History Social History   Tobacco Use   Smoking status: Some Days    Current packs/day: 0.25    Average packs/day: 0.3 packs/day for 10.0 years (2.5 ttl pk-yrs)    Types: Cigarettes   Smokeless tobacco: Never  Vaping Use   Vaping status: Never Used  Substance Use Topics   Alcohol use: Yes    Alcohol/week: 3.0 standard drinks of alcohol    Types: 3 Cans of beer per week   Drug use: No     Allergies   Amoxicillin and Hydrocodone   Review of Systems Review of Systems  Respiratory:  Positive for cough.    Per HPI  Physical Exam Triage Vital Signs ED Triage Vitals  Encounter Vitals Group     BP 12/25/23 1010 123/78     Systolic BP Percentile --      Diastolic BP Percentile --      Pulse Rate 12/25/23 1010 99     Resp 12/25/23 1010 20     Temp 12/25/23 1010 99 F (37.2 C)     Temp Source 12/25/23 1010 Oral     SpO2 12/25/23 1010 96 %     Weight --      Height --      Head Circumference --      Peak Flow --      Pain Score 12/25/23 1004 4     Pain Loc --      Pain Education --      Exclude from Growth Chart --    No data found.  Updated Vital Signs BP 123/78 (BP Location: Right Arm) Comment (BP Location): LARGE CUFF  Pulse 99   Temp 99 F (37.2 C) (Oral)   Resp 20   SpO2 96%   Visual Acuity Right Eye Distance:   Left Eye Distance:   Bilateral Distance:    Right Eye Near:   Left  Eye Near:    Bilateral Near:     Physical Exam Vitals and nursing  note reviewed.  Constitutional:      General: She is awake. She is not in acute distress.    Appearance: Normal appearance. She is well-developed and well-groomed. She is not ill-appearing.  HENT:     Right Ear: Tympanic membrane, ear canal and external ear normal.     Left Ear: Tympanic membrane, ear canal and external ear normal.     Nose: Congestion and rhinorrhea present.     Mouth/Throat:     Mouth: Mucous membranes are moist.     Pharynx: Posterior oropharyngeal erythema present. No oropharyngeal exudate.  Cardiovascular:     Rate and Rhythm: Normal rate and regular rhythm.  Pulmonary:     Effort: Pulmonary effort is normal.     Breath sounds: Wheezing present.  Musculoskeletal:        General: Normal range of motion.  Skin:    General: Skin is warm and dry.  Neurological:     Mental Status: She is alert.  Psychiatric:        Behavior: Behavior is cooperative.      UC Treatments / Results  Labs (all labs ordered are listed, but only abnormal results are displayed) Labs Reviewed  POC COVID19/FLU A&B COMBO - Abnormal; Notable for the following components:      Result Value   Influenza A Antigen, POC Positive (*)    All other components within normal limits    EKG   Radiology No results found.  Procedures Procedures (including critical care time)  Medications Ordered in UC Medications  ipratropium-albuterol (DUONEB) 0.5-2.5 (3) MG/3ML nebulizer solution 3 mL (3 mLs Nebulization Given 12/25/23 1047)    Initial Impression / Assessment and Plan / UC Course  I have reviewed the triage vital signs and the nursing notes.  Pertinent labs & imaging results that were available during my care of the patient were reviewed by me and considered in my medical decision making (see chart for details).     Upon assessment congestion and rhinorrhea are present, mild erythema noted to pharynx.  Wheezing auscultated throughout all lung fields.  Given DuoNeb with relief of  wheezing.  Tested positive for influenza A.  Prescribed Tamiflu.  Prescribed prednisone for asthma exacerbation.  Discussed when to use albuterol inhaler and nebulizers.  Discussed over-the-counter medication for other influenza related symptoms.  Discussed return and strict ER precautions Final Clinical Impressions(s) / UC Diagnoses   Final diagnoses:  Influenza A  Mild intermittent asthma with acute exacerbation     Discharge Instructions      You tested positive for influenza A today.  I have sent a prescription for Tamiflu that you will take twice daily for 5 days.  As discussed this is not a cure for the flu, but can potentially help you recover sooner.  I have also sent a prescription for a short course of steroids that you will take once daily for 5 days to assist with asthma exacerbation.  Use albuterol inhaler and nebulizers as needed for shortness of breath, chest tightness, and wheezing.  Otherwise alternate between ibuprofen and Tylenol as needed for body aches and fever.  I recommend Mucinex for cough and congestion as needed. Stay hydrated and get plenty of rest. Return here if symptoms persist or worsen.   If you develop trouble breathing or severe chest pain please seek immediate medical treatment in the emergency department.  ED Prescriptions     Medication Sig Dispense Auth. Provider   oseltamivir (TAMIFLU) 75 MG capsule Take 1 capsule (75 mg total) by mouth every 12 (twelve) hours. 10 capsule Wynonia Lawman A, NP   predniSONE (DELTASONE) 20 MG tablet Take 2 tablets (40 mg total) by mouth daily for 5 days. 10 tablet Wynonia Lawman A, NP      PDMP not reviewed this encounter.   Wynonia Lawman A, NP 12/25/23 1145

## 2023-12-25 NOTE — ED Notes (Signed)
 Reviewed work note and answered questions

## 2023-12-25 NOTE — Discharge Instructions (Signed)
 You tested positive for influenza A today.  I have sent a prescription for Tamiflu that you will take twice daily for 5 days.  As discussed this is not a cure for the flu, but can potentially help you recover sooner.  I have also sent a prescription for a short course of steroids that you will take once daily for 5 days to assist with asthma exacerbation.  Use albuterol inhaler and nebulizers as needed for shortness of breath, chest tightness, and wheezing.  Otherwise alternate between ibuprofen and Tylenol as needed for body aches and fever.  I recommend Mucinex for cough and congestion as needed. Stay hydrated and get plenty of rest. Return here if symptoms persist or worsen.   If you develop trouble breathing or severe chest pain please seek immediate medical treatment in the emergency department.

## 2023-12-25 NOTE — ED Triage Notes (Signed)
 Symptoms started 2 days ago, symptoms worsened significantly yesterday.  Has chills, sweats, congestion in chest.  General aches.    Home test for covid, flu a and flu b was performed yesterday and was negative.  Patient took ibuprofen, albuterol nebulizer, slight improvement.    Daughter that lives with patient tested positive for covid and flu a last week
